# Patient Record
Sex: Female | Born: 1975 | Race: White | Hispanic: No | Marital: Single | Smoking: Current every day smoker
Health system: Southern US, Community
[De-identification: ages and names within clinical notes are randomized; demographics above are authoritative.]

## PROBLEM LIST (undated history)

## (undated) DIAGNOSIS — R112 Nausea with vomiting, unspecified: Secondary | ICD-10-CM

## (undated) DIAGNOSIS — F32A Depression, unspecified: Secondary | ICD-10-CM

## (undated) DIAGNOSIS — J45909 Unspecified asthma, uncomplicated: Secondary | ICD-10-CM

## (undated) DIAGNOSIS — M1990 Unspecified osteoarthritis, unspecified site: Secondary | ICD-10-CM

## (undated) DIAGNOSIS — F1910 Other psychoactive substance abuse, uncomplicated: Secondary | ICD-10-CM

## (undated) DIAGNOSIS — I10 Essential (primary) hypertension: Secondary | ICD-10-CM

## (undated) DIAGNOSIS — J449 Chronic obstructive pulmonary disease, unspecified: Secondary | ICD-10-CM

## (undated) DIAGNOSIS — T8859XA Other complications of anesthesia, initial encounter: Secondary | ICD-10-CM

## (undated) DIAGNOSIS — E079 Disorder of thyroid, unspecified: Secondary | ICD-10-CM

## (undated) DIAGNOSIS — Z9889 Other specified postprocedural states: Secondary | ICD-10-CM

## (undated) DIAGNOSIS — E039 Hypothyroidism, unspecified: Secondary | ICD-10-CM

## (undated) DIAGNOSIS — F419 Anxiety disorder, unspecified: Secondary | ICD-10-CM

## (undated) DIAGNOSIS — E119 Type 2 diabetes mellitus without complications: Secondary | ICD-10-CM

## (undated) DIAGNOSIS — E785 Hyperlipidemia, unspecified: Secondary | ICD-10-CM

## (undated) HISTORY — PX: BACK SURGERY: SHX140

## (undated) HISTORY — DX: Hyperlipidemia, unspecified: E78.5

## (undated) HISTORY — PX: FOOT SURGERY: SHX648

## (undated) HISTORY — DX: Essential (primary) hypertension: I10

## (undated) HISTORY — DX: Unspecified osteoarthritis, unspecified site: M19.90

## (undated) HISTORY — DX: Other psychoactive substance abuse, uncomplicated: F19.10

## (undated) HISTORY — DX: Disorder of thyroid, unspecified: E07.9

---

## 2004-03-12 ENCOUNTER — Emergency Department (HOSPITAL_COMMUNITY): Admission: EM | Admit: 2004-03-12 | Discharge: 2004-03-12 | Payer: Self-pay | Admitting: Emergency Medicine

## 2004-09-29 ENCOUNTER — Ambulatory Visit: Payer: Self-pay | Admitting: Orthopedic Surgery

## 2004-10-17 ENCOUNTER — Ambulatory Visit: Payer: Self-pay | Admitting: Orthopedic Surgery

## 2004-11-15 ENCOUNTER — Encounter: Admission: RE | Admit: 2004-11-15 | Discharge: 2004-11-15 | Payer: Self-pay | Admitting: Orthopedic Surgery

## 2004-11-21 ENCOUNTER — Ambulatory Visit: Payer: Self-pay | Admitting: Orthopedic Surgery

## 2004-12-19 ENCOUNTER — Ambulatory Visit: Payer: Self-pay | Admitting: Orthopedic Surgery

## 2004-12-27 ENCOUNTER — Encounter: Admission: RE | Admit: 2004-12-27 | Discharge: 2004-12-27 | Payer: Self-pay | Admitting: Orthopedic Surgery

## 2005-01-02 ENCOUNTER — Ambulatory Visit: Payer: Self-pay | Admitting: Orthopedic Surgery

## 2005-01-11 ENCOUNTER — Ambulatory Visit: Payer: Self-pay | Admitting: Orthopedic Surgery

## 2005-02-01 ENCOUNTER — Ambulatory Visit: Payer: Self-pay | Admitting: Orthopedic Surgery

## 2005-02-28 ENCOUNTER — Encounter: Admission: RE | Admit: 2005-02-28 | Discharge: 2005-02-28 | Payer: Self-pay | Admitting: Sports Medicine

## 2007-11-21 HISTORY — PX: CHOLECYSTECTOMY: SHX55

## 2009-06-07 ENCOUNTER — Emergency Department (HOSPITAL_COMMUNITY): Admission: EM | Admit: 2009-06-07 | Discharge: 2009-06-07 | Payer: Self-pay | Admitting: Emergency Medicine

## 2010-12-10 ENCOUNTER — Encounter: Payer: Self-pay | Admitting: Sports Medicine

## 2010-12-11 ENCOUNTER — Encounter: Payer: Self-pay | Admitting: Orthopedic Surgery

## 2010-12-11 ENCOUNTER — Encounter: Payer: Self-pay | Admitting: Sports Medicine

## 2011-07-22 HISTORY — PX: KNEE SURGERY: SHX244

## 2012-05-13 ENCOUNTER — Other Ambulatory Visit: Payer: Self-pay | Admitting: Rehabilitation

## 2012-05-13 ENCOUNTER — Ambulatory Visit
Admission: RE | Admit: 2012-05-13 | Discharge: 2012-05-13 | Disposition: A | Discharge status: Home / Self Care | Payer: Private Health Insurance - Indemnity | Source: Ambulatory Visit | Attending: Rehabilitation | Admitting: Rehabilitation

## 2012-05-13 DIAGNOSIS — M545 Low back pain: Secondary | ICD-10-CM

## 2012-05-13 DIAGNOSIS — IMO0002 Reserved for concepts with insufficient information to code with codable children: Secondary | ICD-10-CM

## 2012-05-15 HISTORY — PX: SPINE SURGERY: SHX786

## 2012-09-17 ENCOUNTER — Encounter (INDEPENDENT_AMBULATORY_CARE_PROVIDER_SITE_OTHER): Payer: Self-pay | Admitting: General Surgery

## 2012-09-17 ENCOUNTER — Ambulatory Visit (INDEPENDENT_AMBULATORY_CARE_PROVIDER_SITE_OTHER): Payer: Private Health Insurance - Indemnity | Admitting: General Surgery

## 2012-09-17 VITALS — BP 116/86 | HR 120 | Temp 97.8°F | Ht 67.0 in | Wt 224.8 lb

## 2012-09-17 DIAGNOSIS — K625 Hemorrhage of anus and rectum: Secondary | ICD-10-CM

## 2012-09-17 NOTE — Progress Notes (Signed)
Chief Complaint  Patient presents with  . Pre-op Exam    eval hems  . Rectal Problems    HISTORY: Natasha Cain is a 36 y.o. female who presents to the office with rectal pain.  Other symptoms include bleeding with bowel movement.  She has tried steroid suppositories in the past with decent success.  Diarrhea makes the symptoms worse.  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  She has had one ext thrombectomy in the past.  It is intermittent in nature and occurs about a month.  Her bowel habits are regular and her bowel movements are mainly soft.  She has had constipation in the past associated with narcotics.  Her fiber intake is mild to moderate.  She has never had a colonoscopy.  She does not think she has any prolapsing tissue.      Past Medical History  Diagnosis Date  . Arthritis   . Hyperlipidemia   . Hypertension   . Substance abuse   . Thyroid disease       Past Surgical History  Procedure Date  . Spine surgery 05/15/12  . Cesarean section 05/22/07  . Cholecystectomy 2009  . Knee surgery 07/2011    left        Current Outpatient Prescriptions  Medication Sig Dispense Refill  . ciprofloxacin (CIPRO) 500 MG tablet Take 500 mg by mouth 2 (two) times daily.      Marland Kitchen ibuprofen (ADVIL,MOTRIN) 800 MG tablet Take 800 mg by mouth 2 (two) times daily.      Marland Kitchen levothyroxine (SYNTHROID, LEVOTHROID) 50 MCG tablet Take 50 mcg by mouth daily.      . Multiple Vitamins-Minerals (MULTIVITAMIN WITH MINERALS) tablet Take 1 tablet by mouth daily.      Marland Kitchen omeprazole (PRILOSEC) 20 MG capsule Take 20 mg by mouth daily.      . phentermine 37.5 MG capsule Take 37.5 mg by mouth every morning.          Allergies  Allergen Reactions  . Latex Rash  . Sulfa Antibiotics Rash      Family History  Problem Relation Age of Onset  . Cancer Mother     breast  . Cancer Maternal Aunt     skin cancer on face  . Cancer Paternal  Uncle     colon  . Cancer Maternal Grandfather     lung  . Cancer Paternal Grandfather     colon  . Cancer Cousin     colon    History   Social History  . Marital Status: Married    Spouse Name: N/A    Number of Children: N/A  . Years of Education: N/A   Social History Main Topics  . Smoking status: Current Every Day Smoker -- 1.0 packs/day    Types: Cigarettes  . Smokeless tobacco: None  . Alcohol Use: No  . Drug Use: No  . Sexually Active:    Other Topics Concern  . None   Social History Narrative  . None      REVIEW OF SYSTEMS - PERTINENT POSITIVES ONLY: Review of Systems - General ROS: negative for - chills, fever or weight gain Hematological and Lymphatic ROS: negative for - bleeding problems, blood clots or bruising Respiratory ROS: no cough, shortness of breath, or wheezing Cardiovascular ROS: no chest pain or dyspnea on exertion Gastrointestinal ROS: positive for - abdominal pain, constipation and occasional diarrhea  negative for - appetite loss or melena Genito-Urinary ROS: no dysuria, trouble voiding, or hematuria  EXAM: Filed Vitals:   09/17/12 1108  BP: 116/86  Pulse: 120  Temp: 97.8 F (36.6 C)    General appearance: alert, cooperative and no distress Resp: clear to auscultation bilaterally Cardio: regular rate and rhythm GI: soft, non-tender; bowel sounds normal; no masses,  no organomegaly   Procedure: Anoscopy Surgeon: Maisie Fus Diagnosis: rectal bleeding  Assistant: Christella Scheuermann After the risks and benefits were explained, verbal consent was obtained for above procedure  Anesthesia: none Findings: small post skin tag, moderate left anterior external skin tag with slight mucosal prolpase    ASSESSMENT AND PLAN: Natasha Cain is a 36 y.o. female who presents to my office with rectal bleeding.  This appears to be related to her bowel habits and occasional constipation.  I see evidence that she has had an anal fissure in the past.  I  have recommended that she undergo a colonoscopy, given the rectal bleeding and her strong family history of colon cancer.  For her anorectal pain, I have recommended a high fiber diet and plenty of physical activity and water.  She may add a stool softener to her regimen if she still has trouble with constipation after implementing the above recommendations.  We will schedule her colonoscopy and have her return to see me in 8 wks.    Vanita Panda, MD Colon and Rectal Surgery / General Surgery [facility]      Visit Diagnoses: 1. Rectal bleeding     Primary Care Physician: Samuel Jester, DO

## 2012-09-17 NOTE — Patient Instructions (Addendum)
Fiber Chart  You should 25-30g of fiber per day and drinking 8 glasses of water to help your bowels move regularly.  In the chart below you can look up how much fiber you are getting in an average day.  If you are not getting enough fiber, you should add a fiber supplement to your diet.  Examples of this include Metamucil, FiberCon and Citrucel.  These can be purchased at your local grocery store or pharmacy.      http://www.[employer].edu/offices/health/nutritioncoach/AtoZ/handouts/Fiber.pdf   GETTING TO GOOD BOWEL HEALTH. Irregular bowel habits such as constipation can lead to many problems over time.  Having one soft bowel movement a day is the most important way to prevent further problems.  The anorectal canal is designed to handle stretching and feces to safely manage our ability to get rid of solid waste (feces, poop, stool) out of our body.  BUT, hard constipated stools can act like ripping concrete bricks causing inflamed hemorrhoids, anal fissures, abdominal pain and bloating.     The goal: ONE SOFT BOWEL MOVEMENT A DAY!  To have soft, regular bowel movements:    Drink at least 8 tall glasses of water a day.     Take plenty of fiber.  Fiber is the undigested part of plant food that passes into the colon, acting s "natures broom" to encourage bowel motility and movement.  Fiber can absorb and hold large amounts of water. This results in a larger, bulkier stool, which is soft and easier to pass. Work gradually over several weeks up to 6 servings a day of fiber (25g a day even more if needed) in the form of: o Vegetables -- Root (potatoes, carrots, turnips), leafy green (lettuce, salad greens, celery, spinach), or cooked high residue (cabbage, broccoli, etc) o Fruit -- Fresh (unpeeled skin & pulp), Dried (prunes, apricots, cherries, etc ),  or stewed ( applesauce)  o Whole grain breads, pasta, etc (whole wheat)  o Bran cereals    Bulking Agents -- This type of water-retaining fiber generally is  easily obtained each day by one of the following:  o Psyllium bran -- The psyllium plant is remarkable because its ground seeds can retain so much water. This product is available as Metamucil, Konsyl, Effersyllium, Per Diem Fiber, or the less expensive generic preparation in drug and health food stores. Although labeled a laxative, it really is not a laxative.  o Methylcellulose -- This is another fiber derived from wood which also retains water. It is available as Citrucel. o Polyethylene Glycol - and "artificial" fiber commonly called Miralax or Glycolax.  It is helpful for people with gassy or bloated feelings with regular fiber o Flax Seed - a less gassy fiber than psyllium   No reading or other relaxing activity while on the toilet. If bowel movements take longer than 5 minutes, you are too constipated   AVOID CONSTIPATION.  High fiber and water intake usually takes care of this.  Sometimes a laxative is needed to stimulate more frequent bowel movements, but    Laxatives are not a good long-term solution as it can wear the colon out. o Osmotics (Milk of Magnesia, Fleets phosphosoda, Magnesium citrate, MiraLax, GoLytely) are safer than  o Stimulants (Senokot, Castor Oil, Dulcolax, Ex Lax)    o Do not take laxatives for more than 7days in a row.    IF SEVERELY CONSTIPATED, try a Bowel Retraining Program: o Do not use laxatives.  o Eat a diet high in roughage, such as   bran cereals and leafy vegetables.  o Drink six (6) ounces of prune or apricot juice each morning.  o Eat two (2) large servings of stewed fruit each day.  o Take one (1) heaping tablespoon of a psyllium-based bulking agent twice a day. Use sugar-free sweetener when possible to avoid excessive calories.  o Eat a normal breakfast.  o Set aside 15 minutes after breakfast to sit on the toilet, but do not strain to have a bowel movement.  o If you do not have a bowel movement by the third day, use an enema and repeat the above steps.         [facility]  ONE-DAY (1) PRE-OP HOME COLON PREP INSTRUCTIONS: ** MIRALAX / GATORADE PREP **  You must follow the instructions below carefully.  If you have questions or problems, please call and speak to someone in the clinic department at our office:   [phone].     INSTRUCTIONS: 1. Five days prior to your procedure do not eat nuts, popcorn, or fruit with seeds.  Stop all fiber supplements such as Metamucil, Citrucel, etc. 2. Two days before surgery fill the prescription at a pharmacy of your choice and purchase the additional supplies below.         MIRALAX - GATORADE -- DULCOLAX TABS:   Purchase a bottle of MIRALAX  (255 gm bottle)    In addition, purchase four (4) DULCOLAX TABLETS (no prescription required- ask the pharmacist if you can't find them)    Purchase one 64 oz GATORADE.  (Do NOT purchase red Gatorade; any other flavor is acceptable) and place in refrigerator to get cold.  3.   Day Before Surgery:   6 am: take the 4 Dulcolax tablets   You may only have clear liquids (tea, coffee, juice, broth, jello, soft drinks, gummy bears).  You cannot have solid foods, cream, milk or milk products.  Drink at lease 8 ounces of liquids every hour while awake.   Mix the entire bottle of MiraLax and the Gatorade in a large container.    10:00am: Begin drinking the Gatorade mixture until gone (8 oz every 15-30 minutes).      You may suck on a lime wedge or hard candy to "freshen your palate" in between glasses   If you are a diabetic, take your blood sugar reading several time throughout the prep.  Have some juice available to take if your sugar level gets too low   You may feel chilled while taking the prep.  Have some warm tea or broth to help warm up.   Continue clear liquids until midnight or bedtime  3. The day of your procedure:   Do not eat or drink ANYTHING after midnight before your surgery.     If you take Heart or Blood Pressure medicine, ask the pre-op  nurses about these during your preop appointment.   Further pre-operative instructions will be given to you from the hospital.   Expect to be contacted 5-7 days before your surgery. 

## 2012-10-11 ENCOUNTER — Ambulatory Visit (HOSPITAL_COMMUNITY)
Admission: RE | Admit: 2012-10-11 | Payer: Managed Care, Other (non HMO) | Source: Ambulatory Visit | Admitting: General Surgery

## 2012-10-11 ENCOUNTER — Encounter (HOSPITAL_COMMUNITY): Admission: RE | Payer: Self-pay | Source: Ambulatory Visit

## 2012-10-11 SURGERY — COLONOSCOPY
Anesthesia: Moderate Sedation

## 2012-10-28 ENCOUNTER — Encounter (HOSPITAL_COMMUNITY): Payer: Self-pay | Admitting: Pharmacy Technician

## 2012-11-01 ENCOUNTER — Ambulatory Visit (HOSPITAL_COMMUNITY)
Admission: RE | Admit: 2012-11-01 | Payer: Managed Care, Other (non HMO) | Source: Ambulatory Visit | Admitting: General Surgery

## 2012-11-01 ENCOUNTER — Encounter (HOSPITAL_COMMUNITY): Admission: RE | Payer: Self-pay | Source: Ambulatory Visit

## 2012-11-01 SURGERY — COLONOSCOPY
Anesthesia: Moderate Sedation

## 2012-11-11 ENCOUNTER — Ambulatory Visit (INDEPENDENT_AMBULATORY_CARE_PROVIDER_SITE_OTHER): Payer: Private Health Insurance - Indemnity | Admitting: General Surgery

## 2013-03-04 ENCOUNTER — Emergency Department (HOSPITAL_COMMUNITY)
Admission: EM | Admit: 2013-03-04 | Discharge: 2013-03-04 | Disposition: A | Discharge status: Home / Self Care | Payer: Worker's Compensation | Attending: Emergency Medicine | Admitting: Emergency Medicine

## 2013-03-04 ENCOUNTER — Encounter (HOSPITAL_COMMUNITY): Payer: Self-pay | Admitting: *Deleted

## 2013-03-04 ENCOUNTER — Emergency Department (HOSPITAL_COMMUNITY): Payer: Worker's Compensation

## 2013-03-04 DIAGNOSIS — Z9889 Other specified postprocedural states: Secondary | ICD-10-CM | POA: Insufficient documentation

## 2013-03-04 DIAGNOSIS — I10 Essential (primary) hypertension: Secondary | ICD-10-CM | POA: Insufficient documentation

## 2013-03-04 DIAGNOSIS — R296 Repeated falls: Secondary | ICD-10-CM | POA: Insufficient documentation

## 2013-03-04 DIAGNOSIS — E079 Disorder of thyroid, unspecified: Secondary | ICD-10-CM | POA: Insufficient documentation

## 2013-03-04 DIAGNOSIS — M129 Arthropathy, unspecified: Secondary | ICD-10-CM | POA: Insufficient documentation

## 2013-03-04 DIAGNOSIS — M5431 Sciatica, right side: Secondary | ICD-10-CM

## 2013-03-04 DIAGNOSIS — Y939 Activity, unspecified: Secondary | ICD-10-CM | POA: Insufficient documentation

## 2013-03-04 DIAGNOSIS — E785 Hyperlipidemia, unspecified: Secondary | ICD-10-CM | POA: Insufficient documentation

## 2013-03-04 DIAGNOSIS — Y9229 Other specified public building as the place of occurrence of the external cause: Secondary | ICD-10-CM | POA: Insufficient documentation

## 2013-03-04 DIAGNOSIS — Z79899 Other long term (current) drug therapy: Secondary | ICD-10-CM | POA: Insufficient documentation

## 2013-03-04 DIAGNOSIS — R32 Unspecified urinary incontinence: Secondary | ICD-10-CM | POA: Insufficient documentation

## 2013-03-04 DIAGNOSIS — IMO0002 Reserved for concepts with insufficient information to code with codable children: Secondary | ICD-10-CM | POA: Insufficient documentation

## 2013-03-04 DIAGNOSIS — F17200 Nicotine dependence, unspecified, uncomplicated: Secondary | ICD-10-CM | POA: Insufficient documentation

## 2013-03-04 MED ORDER — OXYCODONE-ACETAMINOPHEN 5-325 MG PO TABS: 1.0000 | ORAL_TABLET | Freq: Four times a day (QID) | ORAL | Status: DC | PRN

## 2013-03-04 MED ORDER — HYDROMORPHONE HCL PF 1 MG/ML IJ SOLN
1.0000 mg | Freq: Once | INTRAMUSCULAR | Status: DC
  Filled 2013-03-04: qty 1

## 2013-03-04 MED ORDER — METHYLPREDNISOLONE SODIUM SUCC 125 MG IJ SOLR
125.0000 mg | Freq: Once | INTRAMUSCULAR | Status: AC
  Administered 2013-03-04: 125 mg via INTRAVENOUS
  Filled 2013-03-04: qty 2

## 2013-03-04 MED ORDER — PREDNISONE 10 MG PO TABS: 20.0000 mg | ORAL_TABLET | Freq: Every day | ORAL | Status: DC

## 2013-03-04 MED ORDER — KETOROLAC TROMETHAMINE 30 MG/ML IJ SOLN
30.0000 mg | Freq: Once | INTRAMUSCULAR | Status: AC
  Administered 2013-03-04: 30 mg via INTRAVENOUS
  Filled 2013-03-04: qty 1

## 2013-03-04 MED ORDER — ONDANSETRON HCL 4 MG/2ML IJ SOLN
4.0000 mg | Freq: Once | INTRAMUSCULAR | Status: DC
  Filled 2013-03-04: qty 2
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
## 2013-07-04 ENCOUNTER — Emergency Department (HOSPITAL_COMMUNITY)
Admission: EM | Admit: 2013-07-04 | Discharge: 2013-07-04 | Disposition: A | Discharge status: Home / Self Care | Payer: Worker's Compensation | Attending: Emergency Medicine | Admitting: Emergency Medicine

## 2013-07-04 ENCOUNTER — Encounter (HOSPITAL_COMMUNITY): Payer: Self-pay

## 2013-07-04 DIAGNOSIS — F17200 Nicotine dependence, unspecified, uncomplicated: Secondary | ICD-10-CM | POA: Diagnosis not present

## 2013-07-04 DIAGNOSIS — IMO0002 Reserved for concepts with insufficient information to code with codable children: Secondary | ICD-10-CM

## 2013-07-04 DIAGNOSIS — Z79899 Other long term (current) drug therapy: Secondary | ICD-10-CM | POA: Insufficient documentation

## 2013-07-04 DIAGNOSIS — F1910 Other psychoactive substance abuse, uncomplicated: Secondary | ICD-10-CM | POA: Insufficient documentation

## 2013-07-04 DIAGNOSIS — E079 Disorder of thyroid, unspecified: Secondary | ICD-10-CM | POA: Insufficient documentation

## 2013-07-04 DIAGNOSIS — Z862 Personal history of diseases of the blood and blood-forming organs and certain disorders involving the immune mechanism: Secondary | ICD-10-CM | POA: Insufficient documentation

## 2013-07-04 DIAGNOSIS — M5126 Other intervertebral disc displacement, lumbar region: Secondary | ICD-10-CM | POA: Insufficient documentation

## 2013-07-04 DIAGNOSIS — Z8739 Personal history of other diseases of the musculoskeletal system and connective tissue: Secondary | ICD-10-CM | POA: Insufficient documentation

## 2013-07-04 DIAGNOSIS — Z8639 Personal history of other endocrine, nutritional and metabolic disease: Secondary | ICD-10-CM | POA: Insufficient documentation

## 2013-07-04 DIAGNOSIS — Z91040 Latex allergy status: Secondary | ICD-10-CM | POA: Insufficient documentation

## 2013-07-04 DIAGNOSIS — I10 Essential (primary) hypertension: Secondary | ICD-10-CM | POA: Insufficient documentation

## 2013-07-04 DIAGNOSIS — M545 Low back pain: Secondary | ICD-10-CM | POA: Diagnosis present

## 2013-07-04 MED ORDER — HYDROMORPHONE HCL PF 1 MG/ML IJ SOLN
1.0000 mg | Freq: Once | INTRAMUSCULAR | Status: AC
  Administered 2013-07-04: 1 mg via INTRAMUSCULAR
  Filled 2013-07-04: qty 1

## 2013-07-04 MED ORDER — OXYCODONE-ACETAMINOPHEN 5-325 MG PO TABS: 2.0000 | ORAL_TABLET | ORAL | Status: DC | PRN

## 2013-07-04 MED ORDER — ZOLPIDEM TARTRATE 5 MG PO TABS: 5.0000 mg | ORAL_TABLET | Freq: Every evening | ORAL | Status: DC | PRN

## 2013-07-04 MED ORDER — PREDNISONE 20 MG PO TABS: ORAL_TABLET | ORAL | Status: DC

## 2013-07-04 NOTE — ED Notes (Signed)
Pt c/o back pain since April after an injury. Pt states she had surgery at L5 last year and had no complications until her injury in April. Pt has appointment in September with surgeon.

## 2013-07-04 NOTE — ED Provider Notes (Signed)
CSN: [id number]     Arrival date & time 07/04/13  1513 History  This chart was scribed for Donnetta Hutching, MD by Shari Heritage, ED Scribe. The patient was seen in room APA09/APA09. Patient's care was started at 1538.   First MD Initiated Contact with Patient 07/04/13 1538     Chief Complaint  Patient presents with  . Back Pain    The history is provided by the patient. No language interpreter was used.    HPI Comments: Natasha Cain is a 37 y.o. female with history of herniated discs (L3, L4) and lumbar discectomy (L5) who presents to the Emergency Department complaining of severe, dull, constant lower back pain onset 4 months ago. She states that over the last week, pain has progressively worsened. Pain is worse with movement and often wakes her from sleep.  She also reports associated numbness in her right leg and intermittent bladder incontinence. She says that she was injured in April 2014 at work [employer]). She has an appointment scheduled with Dr. Noel Gerold, an orthopedic surgeon in [place], on September 19. She has seen a PA in this office who advised her to take Motrin and prescribed Gabapentin and Ultram, but states that this medicine is not providing relief. She says that in the past, Percocet has improved pain. Patient's other medical history includes hyperlipidemia, hypertension, and thyroid disease.   Past Medical History  Diagnosis Date  . Arthritis   . Hyperlipidemia   . Hypertension   . Substance abuse   . Thyroid disease    Past Surgical History  Procedure Laterality Date  . Spine surgery  05/15/12  . Cesarean section  05/22/07  . Cholecystectomy  2009  . Knee surgery  07/2011    left   Family History  Problem Relation Age of Onset  . Cancer Mother     breast  . Cancer Maternal Aunt     skin cancer on face  . Cancer Paternal Uncle     colon  . Cancer Maternal Grandfather     lung  . Cancer Paternal Grandfather     colon  . Cancer Cousin     colon    History  Substance Use Topics  . Smoking status: Current Every Day Smoker -- 1.00 packs/day    Types: Cigarettes  . Smokeless tobacco: Not on file  . Alcohol Use: No   OB History   Grav Para Term Preterm Abortions TAB SAB Ect Mult Living                 Review of Systems A complete 10 system review of systems was obtained and all systems are negative except as noted in the HPI and PMH.   Allergies  Latex and Sulfa antibiotics  Home Medications   Current Outpatient Rx  Name  Route  Sig  Dispense  Refill  . ibuprofen (ADVIL,MOTRIN) 800 MG tablet   Oral   Take 800 mg by mouth 3 (three) times daily as needed. For pain         . levothyroxine (SYNTHROID, LEVOTHROID) 50 MCG tablet   Oral   Take 50 mcg by mouth every morning.          . Multiple Vitamins-Minerals (MULTIVITAMIN WITH MINERALS) tablet   Oral   Take 1 tablet by mouth daily.         . norethindrone (NORA-BE) 0.35 MG tablet   Oral   Take 1 tablet by mouth every morning.         Marland Kitchen  omeprazole (PRILOSEC) 40 MG capsule   Oral   Take 40 mg by mouth daily.         Marland Kitchen oxyCODONE-acetaminophen (PERCOCET/ROXICET) 5-325 MG per tablet   Oral   Take 1 tablet by mouth every 6 (six) hours as needed for pain.   40 tablet   0   . phentermine 37.5 MG capsule   Oral   Take 37.5 mg by mouth every morning.         . predniSONE (DELTASONE) 10 MG tablet   Oral   Take 2 tablets (20 mg total) by mouth daily.   14 tablet   0   . triamterene-hydrochlorothiazide (MAXZIDE-25) 37.5-25 MG per tablet   Oral   Take 1 tablet by mouth daily.         . Vitamin D, Ergocalciferol, (DRISDOL) 50000 UNITS CAPS   Oral   Take 50,000 Units by mouth 2 (two) times a week.         Marland Kitchen VITAMIN E PO   Oral   Take 1 tablet by mouth daily.          Triage Vitals: BP 137/88  Pulse 118  Temp(Src) 98.6 F (37 C) (Oral)  SpO2 100%  Physical Exam  Constitutional: She is oriented to person, place, and time. She appears  well-developed and well-nourished.  HENT:  Head: Normocephalic and atraumatic.  Eyes: Conjunctivae and EOM are normal.  Neck: Normal range of motion. Neck supple.  Cardiovascular: Normal rate.   Pulmonary/Chest: Effort normal. No respiratory distress.  Musculoskeletal: She exhibits no edema.  Tender in lumbar region. Has difficulty ambulating.  Neurological: She is alert and oriented to person, place, and time.  Skin: Skin is warm and dry. No rash noted.  Psychiatric: She has a normal mood and affect.    ED Course   Procedures (including critical care time) DIAGNOSTIC STUDIES: Oxygen Saturation is 100% on room air, normal by my interpretation.    COORDINATION OF CARE: 4:34 PM- Will attempt to arrange a sooner appointment with orthopedic surgery. Will prescribe Percocet, prednisone and Ambien. Will order a shot of Percocet in the ED. Patient informed of current plan for treatment and evaluation and agrees with plan at this time.     Labs Reviewed - No data to display No results found. No diagnosis found.  MDM  Patient complains of persistent low back pain.  No bowel or bladder incontinence.  Rx Percocet, prednisone, Ambien.   Referral to orthopedic Dr.     I personally performed the services described in this documentation, which was scribed in my presence. The recorded information has been reviewed and is accurate.    Donnetta Hutching, MD 07/06/13 [phone]

## 2013-07-04 NOTE — ED Notes (Signed)
Pt reports an injury to her back in April of this year.  Pt reports severe pain to her lower back that continues to worsen.

## 2013-07-28 ENCOUNTER — Encounter (HOSPITAL_COMMUNITY): Payer: Self-pay | Admitting: *Deleted

## 2013-07-28 ENCOUNTER — Emergency Department (HOSPITAL_COMMUNITY)
Admission: EM | Admit: 2013-07-28 | Discharge: 2013-07-28 | Disposition: A | Discharge status: Home / Self Care | Payer: Worker's Compensation | Attending: Emergency Medicine | Admitting: Emergency Medicine

## 2013-07-28 DIAGNOSIS — Z791 Long term (current) use of non-steroidal anti-inflammatories (NSAID): Secondary | ICD-10-CM | POA: Insufficient documentation

## 2013-07-28 DIAGNOSIS — Z91040 Latex allergy status: Secondary | ICD-10-CM | POA: Insufficient documentation

## 2013-07-28 DIAGNOSIS — M545 Low back pain, unspecified: Secondary | ICD-10-CM | POA: Insufficient documentation

## 2013-07-28 DIAGNOSIS — I10 Essential (primary) hypertension: Secondary | ICD-10-CM | POA: Insufficient documentation

## 2013-07-28 DIAGNOSIS — G8929 Other chronic pain: Secondary | ICD-10-CM | POA: Insufficient documentation

## 2013-07-28 DIAGNOSIS — Z79899 Other long term (current) drug therapy: Secondary | ICD-10-CM | POA: Insufficient documentation

## 2013-07-28 DIAGNOSIS — E079 Disorder of thyroid, unspecified: Secondary | ICD-10-CM | POA: Insufficient documentation

## 2013-07-28 DIAGNOSIS — M79609 Pain in unspecified limb: Secondary | ICD-10-CM | POA: Insufficient documentation

## 2013-07-28 DIAGNOSIS — M129 Arthropathy, unspecified: Secondary | ICD-10-CM | POA: Insufficient documentation

## 2013-07-28 DIAGNOSIS — F17200 Nicotine dependence, unspecified, uncomplicated: Secondary | ICD-10-CM | POA: Insufficient documentation

## 2013-07-28 DIAGNOSIS — Z87828 Personal history of other (healed) physical injury and trauma: Secondary | ICD-10-CM | POA: Insufficient documentation

## 2013-07-28 MED ORDER — OXYCODONE-ACETAMINOPHEN 5-325 MG PO TABS: 1.0000 | ORAL_TABLET | ORAL | Status: DC | PRN

## 2013-07-28 MED ORDER — OXYCODONE-ACETAMINOPHEN 5-325 MG PO TABS
2.0000 | ORAL_TABLET | Freq: Once | ORAL | Status: AC
  Administered 2013-07-28: 2 via ORAL
  Filled 2013-07-28: qty 2

## 2013-07-28 NOTE — ED Notes (Signed)
Chronic lower back pain from injury in April of this year, worsening.  Here for pain relief.

## 2013-07-28 NOTE — ED Notes (Signed)
Back pain since injury in April. Pts rt eye is red, PA suggests using natural tears, and warm  compresses

## 2013-07-28 NOTE — ED Provider Notes (Signed)
CSN: [id number]     Arrival date & time 07/28/13  1726 History   First MD Initiated Contact with Patient 07/28/13 1743     Chief Complaint  Patient presents with  . Back Pain   (Consider location/radiation/quality/duration/timing/severity/associated sxs/prior Treatment) HPI Comments: Patient with hx of worsening of her chronic low back pain.  States she suffered an injury at her place of employment and has filed a [employer] and has been seeing an orthopedic physician who has been prescribing Tramadol, but it has not been controlling her pain recently.  She denies new injury, incontinence of bladder or bowel, numbness or weakness of her lower extremites, dysuria, or abd pain  Patient is a 37 y.o. female presenting with back pain. The history is provided by the patient.  Back Pain Location:  Lumbar spine Quality:  Aching and burning Radiates to:  R posterior upper leg and R thigh Pain severity:  Severe Pain is:  Same all the time Onset quality:  Gradual Duration:  5 months Timing:  Constant Progression:  Worsening Chronicity:  Chronic Context: falling   Context comment:  Larey Seat at work in April.  Worker's Comp case.   Relieved by:  Nothing Worsened by:  Bending, ambulation, sitting and twisting Ineffective treatments:  Ibuprofen, OTC medications and bed rest Associated symptoms: leg pain   Associated symptoms: no abdominal pain, no abdominal swelling, no bladder incontinence, no bowel incontinence, no chest pain, no dysuria, no fever, no headaches, no numbness, no paresthesias, no pelvic pain, no perianal numbness, no tingling and no weakness     Past Medical History  Diagnosis Date  . Arthritis   . Hyperlipidemia   . Hypertension   . Substance abuse   . Thyroid disease    Past Surgical History  Procedure Laterality Date  . Spine surgery  05/15/12  . Cesarean section  05/22/07  . Cholecystectomy  2009  . Knee surgery  07/2011    left   Family History  Problem Relation  Age of Onset  . Cancer Mother     breast  . Cancer Maternal Aunt     skin cancer on face  . Cancer Paternal Uncle     colon  . Cancer Maternal Grandfather     lung  . Cancer Paternal Grandfather     colon  . Cancer Cousin     colon   History  Substance Use Topics  . Smoking status: Current Every Day Smoker -- 1.00 packs/day    Types: Cigarettes  . Smokeless tobacco: Not on file  . Alcohol Use: No   OB History   Grav Para Term Preterm Abortions TAB SAB Ect Mult Living                 Review of Systems  Constitutional: Negative for fever.  HENT: Negative for neck pain.   Respiratory: Negative for shortness of breath.   Cardiovascular: Negative for chest pain.  Gastrointestinal: Negative for vomiting, abdominal pain, constipation and bowel incontinence.  Genitourinary: Negative for bladder incontinence, dysuria, hematuria, flank pain, decreased urine volume, difficulty urinating and pelvic pain.       No perineal numbness or incontinence of urine or feces  Musculoskeletal: Positive for back pain. Negative for joint swelling and arthralgias.  Skin: Negative for rash.  Neurological: Negative for tingling, weakness, numbness, headaches and paresthesias.  All other systems reviewed and are negative.    Allergies  Latex and Sulfa antibiotics  Home Medications   Current Outpatient Rx  Name  Route  Sig  Dispense  Refill  . fish oil-omega-3 fatty acids 1000 MG capsule   Oral   Take 3 g by mouth daily.         Marland Kitchen gabapentin (NEURONTIN) 300 MG capsule   Oral   Take 600-900 mg by mouth 3 (three) times daily. Take two capsules twice daily and take three capsules at bedtime         . ibuprofen (ADVIL,MOTRIN) 800 MG tablet   Oral   Take 800 mg by mouth 2 (two) times daily. For pain         . levothyroxine (SYNTHROID, LEVOTHROID) 50 MCG tablet   Oral   Take 50 mcg by mouth every morning.          . Multiple Vitamins-Minerals (MULTIVITAMIN WITH MINERALS) tablet    Oral   Take 1 tablet by mouth at bedtime.          Marland Kitchen omeprazole (PRILOSEC) 40 MG capsule   Oral   Take 40 mg by mouth every morning.          Marland Kitchen oxyCODONE-acetaminophen (PERCOCET) 5-325 MG per tablet   Oral   Take 2 tablets by mouth every 4 (four) hours as needed for pain.   30 tablet   0   . oxyCODONE-acetaminophen (PERCOCET/ROXICET) 5-325 MG per tablet   Oral   Take 1 tablet by mouth every 4 (four) hours as needed for pain.   15 tablet   0   . phentermine 37.5 MG capsule   Oral   Take 37.5 mg by mouth every morning.         . predniSONE (DELTASONE) 20 MG tablet      3 tabs po day one, then 2 po daily x 4 days   11 tablet   0   . tiZANidine (ZANAFLEX) 4 MG tablet   Oral   Take 4 mg by mouth at bedtime. May take one tablet three times daily         . traMADol (ULTRAM) 50 MG tablet   Oral   Take 100 mg by mouth 3 (three) times daily.         Marland Kitchen triamterene-hydrochlorothiazide (MAXZIDE-25) 37.5-25 MG per tablet   Oral   Take 1 tablet by mouth every morning.          . zolpidem (AMBIEN) 5 MG tablet   Oral   Take 1 tablet (5 mg total) by mouth at bedtime as needed for sleep.   20 tablet   0    BP 145/103  Pulse 99  Temp(Src) 98.3 F (36.8 C) (Oral)  Resp 16  Ht 5\' 7"  (1.702 m)  Wt 224 lb (101.606 kg)  BMI 35.08 kg/m2  SpO2 100%  LMP 06/27/2013 Physical Exam  Nursing note and vitals reviewed. Constitutional: She is oriented to person, place, and time. She appears well-developed and well-nourished. No distress.  HENT:  Head: Normocephalic and atraumatic.  Neck: Normal range of motion. Neck supple.  Cardiovascular: Normal rate, regular rhythm, normal heart sounds and intact distal pulses.   No murmur heard. Pulmonary/Chest: Effort normal and breath sounds normal. No respiratory distress.  Abdominal: Soft. She exhibits no distension. There is no tenderness.  Musculoskeletal: She exhibits tenderness. She exhibits no edema.       Lumbar back: She  exhibits tenderness and pain. She exhibits normal range of motion, no swelling, no deformity, no laceration and normal pulse.  ttp of the lumbar spine and right  paraspinal muscles.   DP pulses are brisk and symmetrical.  Distal sensation intact.  Hip Flexors/Extensors are intact  Neurological: She is alert and oriented to person, place, and time. She has normal strength. No sensory deficit. She exhibits normal muscle tone. Coordination and gait normal.  Reflex Scores:      Patellar reflexes are 2+ on the right side and 2+ on the left side.      Achilles reflexes are 2+ on the right side and 2+ on the left side. Skin: Skin is warm and dry. No rash noted.    ED Course  Procedures (including critical care time) Labs Review Labs Reviewed - No data to display Imaging Review No results found.  MDM   1. Low back pain    Previous ED charts reviewed by me  Patient reviewed on the [facility] narcotics database. She is currently treated by her orthopedic surgeon with tramadol which she states is not controlling her pain.  Patient has history of chronic low back pain with right-sided lumbar radiculopathy since April of this year.  Patient does report intermittent incontinence of urine, but not bowel. She reports history of same since May of this year and also states that her orthopedic surgeon is aware of this symptom.  Patient is ambulatory. No emergent neurological deficits at this time.  I will prescribe Percocet #15 with the understanding that she will need followup with her primary care physician or orthopedic surgeon for her chronic pain management. Patient verbalizes understanding and agrees to care plan  Giliana Vantil L. Trisha Mangle, PA-C 07/31/13 0118

## 2013-07-31 NOTE — ED Provider Notes (Signed)
Medical screening examination/treatment/procedure(s) were performed by non-physician practitioner and as supervising physician I was immediately available for consultation/collaboration.  Carinna Newhart, MD 07/31/13 1149 

## 2013-09-19 ENCOUNTER — Other Ambulatory Visit (HOSPITAL_COMMUNITY): Payer: Self-pay | Admitting: Orthopaedic Surgery

## 2013-09-19 ENCOUNTER — Ambulatory Visit (HOSPITAL_COMMUNITY)
Admission: RE | Admit: 2013-09-19 | Discharge: 2013-09-19 | Disposition: A | Discharge status: Home / Self Care | Payer: Worker's Compensation | Source: Ambulatory Visit | Attending: Orthopaedic Surgery | Admitting: Orthopaedic Surgery

## 2013-09-19 DIAGNOSIS — M79609 Pain in unspecified limb: Secondary | ICD-10-CM | POA: Insufficient documentation

## 2013-09-19 DIAGNOSIS — I82403 Acute embolism and thrombosis of unspecified deep veins of lower extremity, bilateral: Secondary | ICD-10-CM

## 2013-10-10 ENCOUNTER — Emergency Department (HOSPITAL_COMMUNITY)
Admission: EM | Admit: 2013-10-10 | Discharge: 2013-10-10 | Disposition: A | Discharge status: Home / Self Care | Payer: Worker's Compensation | Attending: Emergency Medicine | Admitting: Emergency Medicine

## 2013-10-10 ENCOUNTER — Encounter (HOSPITAL_COMMUNITY): Payer: Self-pay | Admitting: Emergency Medicine

## 2013-10-10 DIAGNOSIS — F17200 Nicotine dependence, unspecified, uncomplicated: Secondary | ICD-10-CM | POA: Insufficient documentation

## 2013-10-10 DIAGNOSIS — Z91040 Latex allergy status: Secondary | ICD-10-CM | POA: Insufficient documentation

## 2013-10-10 DIAGNOSIS — M542 Cervicalgia: Secondary | ICD-10-CM | POA: Insufficient documentation

## 2013-10-10 DIAGNOSIS — E079 Disorder of thyroid, unspecified: Secondary | ICD-10-CM | POA: Insufficient documentation

## 2013-10-10 DIAGNOSIS — M545 Low back pain, unspecified: Secondary | ICD-10-CM | POA: Insufficient documentation

## 2013-10-10 DIAGNOSIS — I10 Essential (primary) hypertension: Secondary | ICD-10-CM | POA: Insufficient documentation

## 2013-10-10 DIAGNOSIS — M129 Arthropathy, unspecified: Secondary | ICD-10-CM | POA: Insufficient documentation

## 2013-10-10 DIAGNOSIS — R209 Unspecified disturbances of skin sensation: Secondary | ICD-10-CM | POA: Insufficient documentation

## 2013-10-10 DIAGNOSIS — Z3202 Encounter for pregnancy test, result negative: Secondary | ICD-10-CM | POA: Insufficient documentation

## 2013-10-10 DIAGNOSIS — G8921 Chronic pain due to trauma: Secondary | ICD-10-CM | POA: Insufficient documentation

## 2013-10-10 DIAGNOSIS — Z79899 Other long term (current) drug therapy: Secondary | ICD-10-CM | POA: Insufficient documentation

## 2013-10-10 DIAGNOSIS — Z9889 Other specified postprocedural states: Secondary | ICD-10-CM | POA: Insufficient documentation

## 2013-10-10 DIAGNOSIS — M79609 Pain in unspecified limb: Secondary | ICD-10-CM | POA: Insufficient documentation

## 2013-10-10 LAB — URINALYSIS, ROUTINE W REFLEX MICROSCOPIC
Ketones, ur: NEGATIVE mg/dL
Protein, ur: NEGATIVE mg/dL
Urobilinogen, UA: 0.2 mg/dL (ref 0.0–1.0)
pH: 7 (ref 5.0–8.0)

## 2013-10-10 LAB — URINE MICROSCOPIC-ADD ON

## 2013-10-10 MED ORDER — OXYCODONE-ACETAMINOPHEN 5-325 MG PO TABS: 1.0000 | ORAL_TABLET | ORAL | Status: DC | PRN

## 2013-10-10 MED ORDER — HYDROMORPHONE HCL PF 2 MG/ML IJ SOLN
2.0000 mg | Freq: Once | INTRAMUSCULAR | Status: AC
  Administered 2013-10-10: 2 mg via INTRAMUSCULAR
  Filled 2013-10-10: qty 1

## 2013-10-10 NOTE — ED Notes (Signed)
Patient w/prior back injury in April and lower spine surgery in September.  Continuing to have leg pain and also has severe neck pain from same injury.  Having LE edema, but has recently restarted Lasix on Wednesday.

## 2013-10-10 NOTE — ED Notes (Signed)
Pt has no swelling noted to LE.  Pedal pulses present and wnl.  Reports neck pain is radiating down right arm.  Denies new injury.  nad noted.

## 2013-10-11 NOTE — ED Provider Notes (Signed)
CSN: [id number]     Arrival date & time 10/10/13  1108 History   First MD Initiated Contact with Patient 10/10/13 1223     Chief Complaint  Patient presents with  . Back Pain  . Leg Pain   (Consider location/radiation/quality/duration/timing/severity/associated sxs/prior Treatment) Patient is a 37 y.o. female presenting with back pain and neck injury. The history is provided by the patient.  Back Pain Location:  Lumbar spine Quality:  Aching and shooting Radiates to:  R posterior upper leg, R knee and R thigh Pain severity:  Moderate Pain is:  Same all the time Onset quality:  Gradual Duration:  2 months Timing:  Constant Progression:  Unchanged Chronicity:  Chronic Context comment:  Patient has hx of chronic low back pain, had surgery in September but contiues to have pain  Relieved by:  Nothing Worsened by:  Bending and twisting Ineffective treatments:  None tried Associated symptoms: numbness and tingling   Associated symptoms: no abdominal pain, no abdominal swelling, no bowel incontinence, no chest pain, no dysuria, no fever, no headaches, no paresthesias, no pelvic pain, no perianal numbness and no weakness   Neck Injury This is a chronic problem. The current episode started more than 1 month ago. The problem occurs constantly. The problem has been gradually worsening. Associated symptoms include arthralgias, neck pain and numbness. Pertinent negatives include no abdominal pain, chest pain, fever, headaches, joint swelling, nausea, rash, sore throat, swollen glands, urinary symptoms, vertigo, visual change, vomiting or weakness. The symptoms are aggravated by twisting. She has tried oral narcotics and NSAIDs for the symptoms. The treatment provided moderate relief.   Patient states the she has chronic low back pain and neck pain.  States that she had lumbar surgery in September and is waiting for approval to have surgery on her neck.  Pain began as result of a on the job injury.   States she has been on vicodin at home w/o relief of pain.  She denies fever, new symptoms, incontinence of bladder or bowel, or headaches  Past Medical History  Diagnosis Date  . Arthritis   . Hyperlipidemia   . Hypertension   . Substance abuse   . Thyroid disease    Past Surgical History  Procedure Laterality Date  . Spine surgery  05/15/12  . Cesarean section  05/22/07  . Cholecystectomy  2009  . Knee surgery  07/2011    left   Family History  Problem Relation Age of Onset  . Cancer Mother     breast  . Cancer Maternal Aunt     skin cancer on face  . Cancer Paternal Uncle     colon  . Cancer Maternal Grandfather     lung  . Cancer Paternal Grandfather     colon  . Cancer Cousin     colon   History  Substance Use Topics  . Smoking status: Current Every Day Smoker -- 0.50 packs/day    Types: Cigarettes  . Smokeless tobacco: Not on file  . Alcohol Use: No   OB History   Grav Para Term Preterm Abortions TAB SAB Ect Mult Living                 Review of Systems  Constitutional: Negative for fever.  HENT: Negative for sore throat and trouble swallowing.   Respiratory: Negative for chest tightness and shortness of breath.   Cardiovascular: Negative for chest pain.  Gastrointestinal: Negative for nausea, vomiting, abdominal pain, constipation and bowel incontinence.  Genitourinary: Negative for dysuria, hematuria, flank pain, decreased urine volume, difficulty urinating and pelvic pain.       No perineal numbness or incontinence of urine or feces  Musculoskeletal: Positive for arthralgias, back pain and neck pain. Negative for joint swelling and neck stiffness.  Skin: Negative for rash.  Neurological: Positive for tingling and numbness. Negative for vertigo, weakness, headaches and paresthesias.  Psychiatric/Behavioral: Negative for confusion.  All other systems reviewed and are negative.    Allergies  Latex; Ambien; and Sulfa antibiotics  Home Medications    Current Outpatient Rx  Name  Route  Sig  Dispense  Refill  . AMITRIPTYLINE HCL PO   Oral   Take 2 capsules by mouth at bedtime.         . gabapentin (NEURONTIN) 300 MG capsule   Oral   Take 900 mg by mouth 3 (three) times daily.          Marland Kitchen levothyroxine (SYNTHROID, LEVOTHROID) 50 MCG tablet   Oral   Take 50 mcg by mouth every morning.          . Multiple Vitamins-Minerals (MULTIVITAMIN WITH MINERALS) tablet   Oral   Take 1 tablet by mouth at bedtime.          Marland Kitchen omeprazole (PRILOSEC) 40 MG capsule   Oral   Take 40 mg by mouth every morning.          Marland Kitchen tiZANidine (ZANAFLEX) 4 MG tablet   Oral   Take 4 mg by mouth at bedtime. May take one tablet three times daily         . traMADol (ULTRAM) 50 MG tablet   Oral   Take 100 mg by mouth 3 (three) times daily.         Marland Kitchen triamterene-hydrochlorothiazide (MAXZIDE-25) 37.5-25 MG per tablet   Oral   Take 1 tablet by mouth every morning.          Marland Kitchen oxyCODONE-acetaminophen (PERCOCET/ROXICET) 5-325 MG per tablet   Oral   Take 1 tablet by mouth every 4 (four) hours as needed for severe pain.   20 tablet   0    BP 144/81  Pulse 92  Temp(Src) 98.1 F (36.7 C) (Oral)  Resp 20  Ht 5\' 7"  (1.702 m)  Wt 250 lb (113.399 kg)  BMI 39.15 kg/m2  SpO2 100%  LMP 10/05/2013 Physical Exam  Nursing note and vitals reviewed. Constitutional: She is oriented to person, place, and time. She appears well-developed and well-nourished. No distress.  HENT:  Head: Normocephalic and atraumatic.  Mouth/Throat: Oropharynx is clear and moist.  Eyes: EOM are normal. Pupils are equal, round, and reactive to light.  Neck: Phonation normal. Neck supple. Muscular tenderness present. No spinous process tenderness present. No rigidity. Decreased range of motion present. No erythema present. No Brudzinski's sign and no Kernig's sign noted. No thyromegaly present.  ttp of the bilateral cervical paraspinal muscles and along the bilateral  trapezius muscle.  Grip strength is strong and equal bilaterally.  Distal sensation intact,  CR < 2 sec.  Pain to the neck is reproduced with rotation and palpation.    Cardiovascular: Normal rate, regular rhythm, normal heart sounds and intact distal pulses.   No murmur heard. Pulmonary/Chest: Effort normal and breath sounds normal. No respiratory distress. She exhibits no tenderness.  Abdominal: Soft. She exhibits no distension. There is no tenderness. There is no rebound and no guarding.  Musculoskeletal: She exhibits tenderness. She exhibits no edema.  Cervical back: She exhibits tenderness. She exhibits normal range of motion, no bony tenderness, no swelling, no deformity, no spasm and normal pulse.       Lumbar back: She exhibits tenderness and pain. She exhibits normal range of motion, no swelling, no deformity, no laceration and normal pulse.  ttp of the lower lumbar spine and paraspinal muscles.  Surgical scar appears well healed.  No erythema or edema.  DP pulse brisk, Distal sensation intact, slightly diminished on the right.  No calf pain or edema.  Lymphadenopathy:    She has no cervical adenopathy.  Neurological: She is alert and oriented to person, place, and time. She has normal strength. No sensory deficit. She exhibits normal muscle tone. Coordination and gait normal.  Reflex Scores:      Tricep reflexes are 2+ on the right side and 2+ on the left side.      Bicep reflexes are 2+ on the right side and 2+ on the left side.      Patellar reflexes are 2+ on the right side and 2+ on the left side.      Achilles reflexes are 2+ on the right side and 2+ on the left side. Skin: Skin is warm and dry. No rash noted.    ED Course  Procedures (including critical care time) Labs Review Labs Reviewed  URINALYSIS, ROUTINE W REFLEX MICROSCOPIC - Abnormal; Notable for the following:    Hgb urine dipstick SMALL (*)    All other components within normal limits  URINE MICROSCOPIC-ADD  ON - Abnormal; Notable for the following:    Squamous Epithelial / LPF FEW (*)    All other components within normal limits  POCT PREGNANCY, URINE   Imaging Review No results found.  EKG Interpretation   None       MDM   1. Low back pain    Previous ED charts reviewed.  No concerning sx's for emergent neurological or infectious process.  Hx of acute on chronic low back and neck pain.  Patient is well appearing.  Non-toxic appearing.  No meningeal signs.   Patient feeling better after IM medications.  VSS.  Appears stable for d/c.  Agrees to f/u next week with her neurosurgeon.      Seve Monette L. Trisha Mangle, PA-C 10/11/13 2125

## 2013-10-12 NOTE — ED Provider Notes (Signed)
Medical screening examination/treatment/procedure(s) were performed by non-physician practitioner and as supervising physician I was immediately available for consultation/collaboration.  EKG Interpretation   None         Tawny Raspberry L Marticia Reifschneider, MD 10/12/13 0718 

## 2013-11-29 ENCOUNTER — Encounter (HOSPITAL_COMMUNITY): Payer: Self-pay | Admitting: Emergency Medicine

## 2013-11-29 ENCOUNTER — Emergency Department (HOSPITAL_COMMUNITY)
Admission: EM | Admit: 2013-11-29 | Discharge: 2013-11-29 | Disposition: A | Discharge status: Home / Self Care | Payer: Worker's Compensation | Attending: Emergency Medicine | Admitting: Emergency Medicine

## 2013-11-29 DIAGNOSIS — R5383 Other fatigue: Secondary | ICD-10-CM

## 2013-11-29 DIAGNOSIS — R300 Dysuria: Secondary | ICD-10-CM | POA: Insufficient documentation

## 2013-11-29 DIAGNOSIS — Z8639 Personal history of other endocrine, nutritional and metabolic disease: Secondary | ICD-10-CM | POA: Insufficient documentation

## 2013-11-29 DIAGNOSIS — Z79899 Other long term (current) drug therapy: Secondary | ICD-10-CM | POA: Insufficient documentation

## 2013-11-29 DIAGNOSIS — Z91040 Latex allergy status: Secondary | ICD-10-CM | POA: Insufficient documentation

## 2013-11-29 DIAGNOSIS — R209 Unspecified disturbances of skin sensation: Secondary | ICD-10-CM | POA: Insufficient documentation

## 2013-11-29 DIAGNOSIS — Z862 Personal history of diseases of the blood and blood-forming organs and certain disorders involving the immune mechanism: Secondary | ICD-10-CM | POA: Insufficient documentation

## 2013-11-29 DIAGNOSIS — I10 Essential (primary) hypertension: Secondary | ICD-10-CM | POA: Insufficient documentation

## 2013-11-29 DIAGNOSIS — F17200 Nicotine dependence, unspecified, uncomplicated: Secondary | ICD-10-CM | POA: Insufficient documentation

## 2013-11-29 DIAGNOSIS — R5381 Other malaise: Secondary | ICD-10-CM | POA: Insufficient documentation

## 2013-11-29 DIAGNOSIS — G8929 Other chronic pain: Secondary | ICD-10-CM

## 2013-11-29 DIAGNOSIS — M545 Low back pain, unspecified: Secondary | ICD-10-CM | POA: Insufficient documentation

## 2013-11-29 DIAGNOSIS — M129 Arthropathy, unspecified: Secondary | ICD-10-CM | POA: Insufficient documentation

## 2013-11-29 LAB — URINALYSIS, ROUTINE W REFLEX MICROSCOPIC
BILIRUBIN URINE: NEGATIVE
Glucose, UA: NEGATIVE mg/dL
Ketones, ur: NEGATIVE mg/dL
NITRITE: NEGATIVE
Protein, ur: 30 mg/dL — AB
SPECIFIC GRAVITY, URINE: 1.015 (ref 1.005–1.030)
Urobilinogen, UA: 0.2 mg/dL (ref 0.0–1.0)
pH: 6.5 (ref 5.0–8.0)

## 2013-11-29 LAB — URINE MICROSCOPIC-ADD ON

## 2013-11-29 MED ORDER — DIAZEPAM 5 MG/ML IJ SOLN
5.0000 mg | Freq: Once | INTRAMUSCULAR | Status: AC
  Administered 2013-11-29: 5 mg via INTRAMUSCULAR
  Filled 2013-11-29: qty 2

## 2013-11-29 MED ORDER — OXYCODONE-ACETAMINOPHEN 5-325 MG PO TABS: 1.0000 | ORAL_TABLET | ORAL | Status: DC | PRN

## 2013-11-29 MED ORDER — HYDROMORPHONE HCL PF 2 MG/ML IJ SOLN
1.0000 mg | Freq: Once | INTRAMUSCULAR | Status: AC
  Administered 2013-11-29: 1 mg via INTRAMUSCULAR
  Filled 2013-11-29: qty 1

## 2013-11-29 NOTE — ED Notes (Signed)
Patient states she just had her second back surgery due to an injury that happened at work. Patient had bulging disk L3-L6. Patient states, she was taking a shower yesterday when she slipped in the shower and fell. She states she now has new found back pain in her right lower back radiating to the right leg. Patient states she has chronic neck pain and chronic right leg pain that are present. The pain is not worsened or new. Patient has three bulging disks in the neck. Patient was sitting on the edge of the bed and in no acute distress.

## 2013-11-29 NOTE — ED Provider Notes (Signed)
CSN: [id number]     Arrival date & time 11/29/13  1026 History   First MD Initiated Contact with Patient 11/29/13 1111     Chief Complaint  Patient presents with  . Back Pain   (Consider location/radiation/quality/duration/timing/severity/associated sxs/prior Treatment) HPI Comments: Patient is 38 year old female with history of chronic lower back pain who had surgery for this in September 2014 in [facility], she states that she has been referred to a pain clinic but has not gotten into see them yet.  She states that because of kidney failure she is having to cut back on her neurontin.  She reports residual right leg pain and numbness to the bottom of her right foot since surgery.  She states that since she is in between pain clinic and seeing an MD, she has not been taking any pain medication.  She reports that she slipped in the shower 2 days ago and fell backwards into a chair, she states that her pain has increased but denies any increase in weakness, numbness, tingling, loss of control of bowels or bladder.  She reports some mild dysuria at this time but no other symptoms.  Patient is a 38 y.o. female presenting with back pain. The history is provided by the patient. No language interpreter was used.  Back Pain Location:  Lumbar spine Quality:  Aching Radiates to:  R posterior upper leg Pain severity:  Severe Pain is:  Same all the time Onset quality:  Gradual Timing:  Constant Progression:  Worsening Chronicity:  Chronic Context: falling   Relieved by:  Nothing Worsened by:  Nothing tried Ineffective treatments:  None tried Associated symptoms: dysuria, leg pain, numbness and weakness   Associated symptoms: no bladder incontinence, no bowel incontinence, no fever, no paresthesias, no pelvic pain and no perianal numbness     Past Medical History  Diagnosis Date  . Arthritis   . Hyperlipidemia   . Hypertension   . Substance abuse   . Thyroid disease    Past Surgical History    Procedure Laterality Date  . Spine surgery  05/15/12  . Cesarean section  05/22/07  . Cholecystectomy  2009  . Knee surgery  07/2011    left   Family History  Problem Relation Age of Onset  . Cancer Mother     breast  . Cancer Maternal Aunt     skin cancer on face  . Cancer Paternal Uncle     colon  . Cancer Maternal Grandfather     lung  . Cancer Paternal Grandfather     colon  . Cancer Cousin     colon   History  Substance Use Topics  . Smoking status: Current Every Day Smoker -- 0.50 packs/day    Types: Cigarettes  . Smokeless tobacco: Not on file  . Alcohol Use: No   OB History   Grav Para Term Preterm Abortions TAB SAB Ect Mult Living                 Review of Systems  Constitutional: Negative for fever.  Gastrointestinal: Negative for bowel incontinence.  Genitourinary: Positive for dysuria. Negative for bladder incontinence and pelvic pain.  Musculoskeletal: Positive for back pain.  Neurological: Positive for weakness and numbness. Negative for paresthesias.  All other systems reviewed and are negative.    Allergies  Latex; Ambien; and Sulfa antibiotics  Home Medications   Current Outpatient Rx  Name  Route  Sig  Dispense  Refill  . AMITRIPTYLINE  HCL PO   Oral   Take 2 capsules by mouth at bedtime.         . gabapentin (NEURONTIN) 300 MG capsule   Oral   Take 600 mg by mouth 3 (three) times daily.          Marland Kitchen. levothyroxine (SYNTHROID, LEVOTHROID) 50 MCG tablet   Oral   Take 50 mcg by mouth every morning.          . Multiple Vitamins-Minerals (MULTIVITAMIN WITH MINERALS) tablet   Oral   Take 1 tablet by mouth at bedtime.          Marland Kitchen. omeprazole (PRILOSEC) 40 MG capsule   Oral   Take 40 mg by mouth every morning.          Marland Kitchen. tiZANidine (ZANAFLEX) 4 MG tablet   Oral   Take 4 mg by mouth at bedtime. May take one tablet three times daily         . traMADol (ULTRAM) 50 MG tablet   Oral   Take 100 mg by mouth 3 (three) times daily.          Marland Kitchen. triamterene-hydrochlorothiazide (MAXZIDE-25) 37.5-25 MG per tablet   Oral   Take 1 tablet by mouth every morning.           BP 130/71  Pulse 101  Temp(Src) 98 F (36.7 C) (Oral)  Resp 18  Ht 5\' 7"  (1.702 m)  Wt 243 lb (110.224 kg)  BMI 38.05 kg/m2  SpO2 100%  LMP 11/28/2013 Physical Exam  Nursing note and vitals reviewed. Constitutional: She is oriented to person, place, and time. She appears well-developed and well-nourished. No distress.  HENT:  Head: Normocephalic and atraumatic.  Mouth/Throat: Oropharynx is clear and moist. No oropharyngeal exudate.  Eyes: Conjunctivae are normal. Pupils are equal, round, and reactive to light. No scleral icterus.  Neck: Normal range of motion. Neck supple.  Mild tenderness to palpation of cervical spine  Cardiovascular: Normal rate, regular rhythm and normal heart sounds.  Exam reveals no gallop and no friction rub.   No murmur heard. Pulmonary/Chest: Effort normal and breath sounds normal. No respiratory distress. She has no wheezes. She has no rales. She exhibits no tenderness.  Abdominal: Soft. Bowel sounds are normal. She exhibits no distension. There is no tenderness. There is no rebound and no guarding.  No CVA tenderness  Musculoskeletal: She exhibits tenderness.       Lumbar back: She exhibits decreased range of motion, tenderness and bony tenderness.       Back:  Lymphadenopathy:    She has no cervical adenopathy.  Neurological: She is alert and oriented to person, place, and time. She has normal reflexes. She exhibits normal muscle tone. Coordination normal.  Skin: Skin is warm and dry. No rash noted. No erythema. No pallor.  Psychiatric: She has a normal mood and affect. Her behavior is normal. Judgment and thought content normal.    ED Course  Procedures (including critical care time) Labs Review Labs Reviewed  URINALYSIS, ROUTINE W REFLEX MICROSCOPIC - Abnormal; Notable for the following:    Color, Urine RED  (*)    APPearance CLOUDY (*)    Hgb urine dipstick LARGE (*)    Protein, ur 30 (*)    Leukocytes, UA TRACE (*)    All other components within normal limits  URINE MICROSCOPIC-ADD ON - Abnormal; Notable for the following:    Bacteria, UA FEW (*)    All other components within normal limits  URINE CULTURE   Imaging Review No results found.  EKG Interpretation   None      Medications  HYDROmorphone (DILAUDID) injection 1 mg (1 mg Intramuscular Given 11/29/13 1205)  diazepam (VALIUM) injection 5 mg (5 mg Intramuscular Given 11/29/13 1210)     MDM  Lumbar sprain Chronic lower back pain  Patient here with acute on chronic lower back pain - no alarming signs to suggest cauda equina, epidural hematoma.  I do not feel that imaging is warranted at this time as well.  No signs of UTI.  Will discharge home on pain medication.  Mild improvement in pain since medications given here.   Izola Price Marisue Humble, PA-C 11/29/13 1331

## 2013-11-29 NOTE — ED Provider Notes (Signed)
Medical screening examination/treatment/procedure(s) were performed by non-physician practitioner and as supervising physician I was immediately available for consultation/collaboration.  EKG Interpretation   None         Sherlyne Crownover L Helmuth Recupero, MD 11/29/13 1609 

## 2013-11-29 NOTE — Discharge Instructions (Signed)
Back Pain, Adult  Low back pain is very common. About 1 in 5 people have back pain.The cause of low back pain is rarely dangerous. The pain often gets better over time.About half of people with a sudden onset of back pain feel better in just 2 weeks. About 8 in 10 people feel better by 6 weeks.   CAUSES  Some common causes of back pain include:   Strain of the muscles or ligaments supporting the spine.   Wear and tear (degeneration) of the spinal discs.   Arthritis.   Direct injury to the back.  DIAGNOSIS  Most of the time, the direct cause of low back pain is not known.However, back pain can be treated effectively even when the exact cause of the pain is unknown.Answering your caregiver's questions about your overall health and symptoms is one of the most accurate ways to make sure the cause of your pain is not dangerous. If your caregiver needs more information, he or she may order lab work or imaging tests (X-rays or MRIs).However, even if imaging tests show changes in your back, this usually does not require surgery.  HOME CARE INSTRUCTIONS  For many people, back pain returns.Since low back pain is rarely dangerous, it is often a condition that people can learn to manageon their own.    Remain active. It is stressful on the back to sit or stand in one place. Do not sit, drive, or stand in one place for more than 30 minutes at a time. Take short walks on level surfaces as soon as pain allows.Try to increase the length of time you walk each day.   Do not stay in bed.Resting more than 1 or 2 days can delay your recovery.   Do not avoid exercise or work.Your body is made to move.It is not dangerous to be active, even though your back may hurt.Your back will likely heal faster if you return to being active before your pain is gone.   Pay attention to your body when you bend and lift. Many people have less discomfortwhen lifting if they bend their knees, keep the load close to their bodies,and  avoid twisting. Often, the most comfortable positions are those that put less stress on your recovering back.   Find a comfortable position to sleep. Use a firm mattress and lie on your side with your knees slightly bent. If you lie on your back, put a pillow under your knees.   Only take over-the-counter or prescription medicines as directed by your caregiver. Over-the-counter medicines to reduce pain and inflammation are often the most helpful.Your caregiver may prescribe muscle relaxant drugs.These medicines help dull your pain so you can more quickly return to your normal activities and healthy exercise.   Put ice on the injured area.   Put ice in a plastic bag.   Place a towel between your skin and the bag.   Leave the ice on for 15-20 minutes, 03-04 times a day for the first 2 to 3 days. After that, ice and heat may be alternated to reduce pain and spasms.   Ask your caregiver about trying back exercises and gentle massage. This may be of some benefit.   Avoid feeling anxious or stressed.Stress increases muscle tension and can worsen back pain.It is important to recognize when you are anxious or stressed and learn ways to manage it.Exercise is a great option.  SEEK MEDICAL CARE IF:   You have pain that is not relieved with rest or   medicine.   You have pain that does not improve in 1 week.   You have new symptoms.   You are generally not feeling well.  SEEK IMMEDIATE MEDICAL CARE IF:    You have pain that radiates from your back into your legs.   You develop new bowel or bladder control problems.   You have unusual weakness or numbness in your arms or legs.   You develop nausea or vomiting.   You develop abdominal pain.   You feel faint.  Document Released: 11/06/2005 Document Revised: 05/07/2012 Document Reviewed: 03/27/2011  Exit[employer] Patient Information 2014 [employer].          Chronic Pain  Chronic pain can be defined as pain that is off and on and lasts for 3 6 months or longer.  Many things cause chronic pain, which can make it difficult to make a diagnosis. There are many treatment options available for chronic pain. However, finding a treatment that works well for you may require trying various approaches until the right one is found. Many people benefit from a combination of two or more types of treatment to control their pain.  SYMPTOMS   Chronic pain can occur anywhere in the body and can range from mild to very severe. Some types of chronic pain include:   Headache.   Low back pain.   Cancer pain.   Arthritis pain.   Neurogenic pain. This is pain resulting from damage to nerves.  People with chronic pain may also have other symptoms such as:   Depression.   Anger.   Insomnia.   Anxiety.  DIAGNOSIS   Your health care provider will help diagnose your condition over time. In many cases, the initial focus will be on excluding possible conditions that could be causing the pain. Depending on your symptoms, your health care provider may order tests to diagnose your condition. Some of these tests may include:    Blood tests.    CT scan.    MRI.    X-rays.    Ultrasounds.    Nerve conduction studies.   You may need to see a specialist.   TREATMENT   Finding treatment that works well may take time. You may be referred to a pain specialist. He or she may prescribe medicine or therapies, such as:    Mindful meditation or yoga.   Shots (injections) of numbing or pain-relieving medicines into the spine or area of pain.   Local electrical stimulation.   Acupuncture.    Massage therapy.    Aroma, color, light, or sound therapy.    Biofeedback.    Working with a physical therapist to keep from getting stiff.    Regular, gentle exercise.    Cognitive or behavioral therapy.    Group support.   Sometimes, surgery may be recommended.   HOME CARE INSTRUCTIONS    Take all medicines as directed by your health care provider.    Lessen stress in your life by relaxing  and doing things such as listening to calming music.    Exercise or be active as directed by your health care provider.    Eat a healthy diet and include things such as vegetables, fruits, fish, and lean meats in your diet.    Keep all follow-up appointments with your health care provider.    Attend a support group with others suffering from chronic pain.  SEEK MEDICAL CARE IF:    Your pain gets worse.    You develop   a new pain that was not there before.    You cannot tolerate medicines given to you by your health care provider.    You have new symptoms since your last visit with your health care provider.   SEEK IMMEDIATE MEDICAL CARE IF:    You feel weak.    You have decreased sensation or numbness.    You lose control of bowel or bladder function.    Your pain suddenly gets much worse.    You develop shaking.   You develop chills.   You develop confusion.   You develop chest pain.   You develop shortness of breath.   MAKE SURE YOU:   Understand these instructions.   Will watch your condition.   Will get help right away if you are not doing well or get worse.  Document Released: 07/29/2002 Document Revised: 07/09/2013 Document Reviewed: 05/02/2013  Exit[employer] Patient Information 2014 [employer].

## 2013-11-29 NOTE — ED Notes (Signed)
Pt reports had recent back surgery and accidentally slipped in the shower 2 days ago.  Pt says has been unable to sleep at night due to back pain.

## 2013-11-30 LAB — URINE CULTURE

## 2014-02-08 ENCOUNTER — Emergency Department (HOSPITAL_COMMUNITY)
Admission: EM | Admit: 2014-02-08 | Discharge: 2014-02-08 | Disposition: A | Discharge status: Home / Self Care | Payer: Worker's Compensation | Attending: Emergency Medicine | Admitting: Emergency Medicine

## 2014-02-08 ENCOUNTER — Emergency Department (HOSPITAL_COMMUNITY): Payer: Worker's Compensation

## 2014-02-08 ENCOUNTER — Encounter (HOSPITAL_COMMUNITY): Payer: Self-pay | Admitting: Emergency Medicine

## 2014-02-08 DIAGNOSIS — R079 Chest pain, unspecified: Secondary | ICD-10-CM | POA: Insufficient documentation

## 2014-02-08 DIAGNOSIS — E079 Disorder of thyroid, unspecified: Secondary | ICD-10-CM | POA: Insufficient documentation

## 2014-02-08 DIAGNOSIS — F17200 Nicotine dependence, unspecified, uncomplicated: Secondary | ICD-10-CM | POA: Insufficient documentation

## 2014-02-08 DIAGNOSIS — G8929 Other chronic pain: Secondary | ICD-10-CM | POA: Insufficient documentation

## 2014-02-08 DIAGNOSIS — M549 Dorsalgia, unspecified: Secondary | ICD-10-CM | POA: Insufficient documentation

## 2014-02-08 DIAGNOSIS — G479 Sleep disorder, unspecified: Secondary | ICD-10-CM | POA: Insufficient documentation

## 2014-02-08 DIAGNOSIS — Z8739 Personal history of other diseases of the musculoskeletal system and connective tissue: Secondary | ICD-10-CM | POA: Insufficient documentation

## 2014-02-08 DIAGNOSIS — R002 Palpitations: Secondary | ICD-10-CM

## 2014-02-08 DIAGNOSIS — Z3202 Encounter for pregnancy test, result negative: Secondary | ICD-10-CM | POA: Insufficient documentation

## 2014-02-08 DIAGNOSIS — I10 Essential (primary) hypertension: Secondary | ICD-10-CM | POA: Insufficient documentation

## 2014-02-08 DIAGNOSIS — M7989 Other specified soft tissue disorders: Secondary | ICD-10-CM | POA: Insufficient documentation

## 2014-02-08 DIAGNOSIS — Z79899 Other long term (current) drug therapy: Secondary | ICD-10-CM | POA: Insufficient documentation

## 2014-02-08 DIAGNOSIS — Z91040 Latex allergy status: Secondary | ICD-10-CM | POA: Insufficient documentation

## 2014-02-08 LAB — BASIC METABOLIC PANEL
BUN: 6 mg/dL (ref 6–23)
CHLORIDE: 98 meq/L (ref 96–112)
CO2: 27 meq/L (ref 19–32)
Calcium: 9.7 mg/dL (ref 8.4–10.5)
Creatinine, Ser: 0.84 mg/dL (ref 0.50–1.10)
GFR calc Af Amer: >90 mL/min (ref >90)
GFR calc non Af Amer: 88 mL/min — ABNORMAL LOW (ref >90)
Glucose, Bld: 134 mg/dL — ABNORMAL HIGH (ref 70–99)
POTASSIUM: 3.5 meq/L — AB (ref 3.7–5.3)
SODIUM: 138 meq/L (ref 137–147)

## 2014-02-08 LAB — CBC WITH DIFFERENTIAL/PLATELET
Basophils Absolute: 0 K/uL (ref 0.0–0.1)
Basophils Relative: 0 % (ref 0–1)
Eosinophils Absolute: 0.3 K/uL (ref 0.0–0.7)
Eosinophils Relative: 4 % (ref 0–5)
HCT: 42.8 % (ref 36.0–46.0)
Hemoglobin: 14.7 g/dL (ref 12.0–15.0)
Lymphocytes Relative: 32 % (ref 12–46)
Lymphs Abs: 2.5 K/uL (ref 0.7–4.0)
MCH: 30.4 pg (ref 26.0–34.0)
MCHC: 34.3 g/dL (ref 30.0–36.0)
MCV: 88.6 fL (ref 78.0–100.0)
Monocytes Absolute: 0.6 K/uL (ref 0.1–1.0)
Monocytes Relative: 8 % (ref 3–12)
Neutro Abs: 4.5 K/uL (ref 1.7–7.7)
Neutrophils Relative %: 57 % (ref 43–77)
Platelets: 388 K/uL (ref 150–400)
RBC: 4.83 MIL/uL (ref 3.87–5.11)
RDW: 13.3 % (ref 11.5–15.5)
WBC: 8 K/uL (ref 4.0–10.5)

## 2014-02-08 LAB — URINALYSIS, ROUTINE W REFLEX MICROSCOPIC
Bilirubin Urine: NEGATIVE
Glucose, UA: NEGATIVE mg/dL
Ketones, ur: NEGATIVE mg/dL
Leukocytes, UA: NEGATIVE
Nitrite: NEGATIVE
Protein, ur: NEGATIVE mg/dL
Specific Gravity, Urine: 1.01 (ref 1.005–1.030)
Urobilinogen, UA: 0.2 mg/dL (ref 0.0–1.0)
pH: 6 (ref 5.0–8.0)

## 2014-02-08 LAB — URINE MICROSCOPIC-ADD ON

## 2014-02-08 LAB — D-DIMER, QUANTITATIVE (NOT AT ARMC): D DIMER QUANT: 0.52 ug{FEU}/mL — AB (ref 0.00–0.48)

## 2014-02-08 LAB — TROPONIN I: Troponin I: <0.3 ng/mL (ref <0.30)

## 2014-02-08 LAB — PREGNANCY, URINE: Preg Test, Ur: NEGATIVE

## 2014-02-08 MED ORDER — KETOROLAC TROMETHAMINE 30 MG/ML IJ SOLN
30.0000 mg | Freq: Once | INTRAMUSCULAR | Status: AC
  Administered 2014-02-08: 30 mg via INTRAMUSCULAR
  Filled 2014-02-08: qty 1

## 2014-02-08 MED ORDER — HYDROCODONE-ACETAMINOPHEN 5-325 MG PO TABS: 1.0000 | ORAL_TABLET | Freq: Four times a day (QID) | ORAL | Status: DC | PRN

## 2014-02-08 MED ORDER — IOHEXOL 350 MG/ML SOLN
100.0000 mL | Freq: Once | INTRAVENOUS | Status: AC | PRN
  Administered 2014-02-08: 100 mL via INTRAVENOUS

## 2014-02-08 NOTE — ED Notes (Signed)
Pt alert & oriented x4, stable gait. Patient given discharge instructions, paperwork & prescription(s). Patient  instructed to stop at the registration desk to finish any additional paperwork. Patient verbalized understanding. Pt left department w/ no further questions. 

## 2014-02-08 NOTE — Discharge Instructions (Signed)
Edema Edema is an abnormal build-up of fluids in tissues. Because this is partly dependent on gravity (water flows to the lowest place), it is more common in the legs and thighs (lower extremities). It is also common in the looser tissues, like around the eyes. Painless swelling of the feet and ankles is common and increases as a person ages. It may affect both legs and may include the calves or even thighs. When squeezed, the fluid may move out of the affected area and may leave a dent for a few moments. CAUSES   Prolonged standing or sitting in one place for extended periods of time. Movement helps pump tissue fluid into the veins, and absence of movement prevents this, resulting in edema.  Varicose veins. The valves in the veins do not work as well as they should. This causes fluid to leak into the tissues.  Fluid and salt overload.  Injury, burn, or surgery to the leg, ankle, or foot, may damage veins and allow fluid to leak out.  Sunburn damages vessels. Leaky vessels allow fluid to go out into the sunburned tissues.  Allergies (from insect bites or stings, medications or chemicals) cause swelling by allowing vessels to become leaky.  Protein in the blood helps keep fluid in your vessels. Low protein, as in malnutrition, allows fluid to leak out.  Hormonal changes, including pregnancy and menstruation, cause fluid retention. This fluid may leak out of vessels and cause edema.  Medications that cause fluid retention. Examples are sex hormones, blood pressure medications, steroid treatment, or anti-depressants.  Some illnesses cause edema, especially heart failure, kidney disease, or liver disease.  Surgery that cuts veins or lymph nodes, such as surgery done for the heart or for breast cancer, may result in edema. DIAGNOSIS  Your caregiver is usually easily able to determine what is causing your swelling (edema) by simply asking what is wrong (getting a history) and examining you (doing  a physical). Sometimes x-rays, EKG (electrocardiogram or heart tracing), and blood work may be done to evaluate for underlying medical illness. TREATMENT  General treatment includes:  Leg elevation (or elevation of the affected body part).  Restriction of fluid intake.  Prevention of fluid overload.  Compression of the affected body part. Compression with elastic bandages or support stockings squeezes the tissues, preventing fluid from entering and forcing it back into the blood vessels.  Diuretics (also called water pills or fluid pills) pull fluid out of your body in the form of increased urination. These are effective in reducing the swelling, but can have side effects and must be used only under your caregiver's supervision. Diuretics are appropriate only for some types of edema. The specific treatment can be directed at any underlying causes discovered. Heart, liver, or kidney disease should be treated appropriately. HOME CARE INSTRUCTIONS   Elevate the legs (or affected body part) above the level of the heart, while lying down.  Avoid sitting or standing still for prolonged periods of time.  Avoid putting anything directly under the knees when lying down, and do not wear constricting clothing or garters on the upper legs.  Exercising the legs causes the fluid to work back into the veins and lymphatic channels. This may help the swelling go down.  The pressure applied by elastic bandages or support stockings can help reduce ankle swelling.  A low-salt diet may help reduce fluid retention and decrease the ankle swelling.  Take any medications exactly as prescribed. SEEK MEDICAL CARE IF:  Your edema is  not responding to recommended treatments. SEEK IMMEDIATE MEDICAL CARE IF:   You develop shortness of breath or chest pain.  You cannot breathe when you lay down; or if, while lying down, you have to get up and go to the window to get your breath.  You are having increasing  swelling without relief from treatment.  You develop a fever over 102 F (38.9 C).  You develop pain or redness in the areas that are swollen.  Tell your caregiver right away if you have gained 03 lb/1.4 kg in 1 day or 05 lb/2.3 kg in a week. MAKE SURE YOU:   Understand these instructions.  Will watch your condition.  Will get help right away if you are not doing well or get worse. Document Released: 11/06/2005 Document Revised: 05/07/2012 Document Reviewed: 06/24/2008 [facility] Patient Information 2014 [place], [place]. Palpitations  A palpitation is the feeling that your heartbeat is irregular or is faster than normal. It may feel like your heart is fluttering or skipping a beat. Palpitations are usually not a serious problem. However, in some cases, you may need further medical evaluation. CAUSES  Palpitations can be caused by:  Smoking.  Caffeine or other stimulants, such as diet pills or energy drinks.  Alcohol.  Stress and anxiety.  Strenuous physical activity.  Fatigue.  Certain medicines.  Heart disease, especially if you have a history of arrhythmias. This includes atrial fibrillation, atrial flutter, or supraventricular tachycardia.  An improperly working pacemaker or defibrillator. DIAGNOSIS  To find the cause of your palpitations, your caregiver will take your history and perform a physical exam. Tests may also be done, including:  Electrocardiography (ECG). This test records the heart's electrical activity.  Cardiac monitoring. This allows your caregiver to monitor your heart rate and rhythm in real time.  Holter monitor. This is a portable device that records your heartbeat and can help diagnose heart arrhythmias. It allows your caregiver to track your heart activity for several days, if needed.  Stress tests by exercise or by giving medicine that makes the heart beat faster. TREATMENT  Treatment of palpitations depends on the cause of your symptoms and  can vary greatly. Most cases of palpitations do not require any treatment other than time, relaxation, and monitoring your symptoms. Other causes, such as atrial fibrillation, atrial flutter, or supraventricular tachycardia, usually require further treatment. HOME CARE INSTRUCTIONS   Avoid:  Caffeinated coffee, tea, soft drinks, diet pills, and energy drinks.  Chocolate.  Alcohol.  Stop smoking if you smoke.  Reduce your stress and anxiety. Things that can help you relax include:  A method that measures bodily functions so you can learn to control them (biofeedback).  Yoga.  Meditation.  Physical activity such as swimming, jogging, or walking.  Get plenty of rest and sleep. SEEK MEDICAL CARE IF:   You continue to have a fast or irregular heartbeat beyond 24 hours.  Your palpitations occur more often. SEEK IMMEDIATE MEDICAL CARE IF:  You develop chest pain or shortness of breath.  You have a severe headache.  You feel dizzy, or you faint. MAKE SURE YOU:  Understand these instructions.  Will watch your condition.  Will get help right away if you are not doing well or get worse. Document Released: 11/03/2000 Document Revised: 03/03/2013 Document Reviewed: 01/05/2012 [facility] Patient Information 2014 [place]ExitCare, [place]LL[place].

## 2014-02-08 NOTE — ED Provider Notes (Addendum)
CSN: [id number]     Arrival date & time 02/08/14  1524 History  This chart was scribed for Shon Baton, MD by Dorothey Baseman, ED Scribe. This patient was seen in room APA16A/APA16A and the patient's care was started at 6:44 PM.    Chief Complaint  Patient presents with  . swelling in legs   . Back Pain   The history is provided by the patient. No language interpreter was used.   HPI Comments: Natasha Cain is a 38 y.o. Female with a history of HTN and hyperlipidemia who presents to the Emergency Department complaining of a constant pain to the lower back that she states radiates down the right leg and is chronic in nature secondary to a spinal surgery in 2013. She states that she has had some swelling to the bilateral legs onset about a week ago that she states presented while she was taking Neurontin, so she was switched to Indomethacin, but that the swelling has persisted. Patient states that she has also been prescribed Zorvolex for her chronic pain, which caused her to have some adverse effects, so she has since stopped taking it. She reports taking 800 mg ibuprofen at home without relief. She reports that she is not currently prescribed or taking any other pain medications. Patient reports that she is currently on her menstrual period. Patient also has a history of arthritis, thyroid disease, and substance abuse.   Patient also reports some intermittent chest pain that she states may be secondary to some anxiety and recent decreases in sleep. She denies any chest pain currently. She denies shortness of breath. Patient reports an allergy to Ambien.   Past Medical History  Diagnosis Date  . Arthritis   . Hyperlipidemia   . Hypertension   . Substance abuse   . Thyroid disease    Past Surgical History  Procedure Laterality Date  . Spine surgery  05/15/12  . Cesarean section  05/22/07  . Cholecystectomy  2009  . Knee surgery  07/2011    left   Family History  Problem Relation Age of  Onset  . Cancer Mother     breast  . Cancer Maternal Aunt     skin cancer on face  . Cancer Paternal Uncle     colon  . Cancer Maternal Grandfather     lung  . Cancer Paternal Grandfather     colon  . Cancer Cousin     colon   History  Substance Use Topics  . Smoking status: Current Every Day Smoker -- 0.50 packs/day    Types: Cigarettes  . Smokeless tobacco: Not on file  . Alcohol Use: No   OB History   Grav Para Term Preterm Abortions TAB SAB Ect Mult Living                 Review of Systems  Respiratory: Negative for shortness of breath.   Cardiovascular: Positive for chest pain and leg swelling.  Musculoskeletal: Positive for back pain (chronic).  Psychiatric/Behavioral: Positive for sleep disturbance.  All other systems reviewed and are negative.      Allergies  Latex; Zorvolex; Ambien; and Sulfa antibiotics  Home Medications   Current Outpatient Rx  Name  Route  Sig  Dispense  Refill  . Diclofenac (ZORVOLEX PO)   Oral   Take 1 capsule by mouth daily.         Marland Kitchen doxepin (SINEQUAN) 25 MG capsule   Oral   Take 75 mg by mouth  at bedtime.         . INDOMETHACIN PO   Oral   Take 1 capsule by mouth daily.         Marland Kitchen. levothyroxine (SYNTHROID, LEVOTHROID) 50 MCG tablet   Oral   Take 50 mcg by mouth at bedtime.          . Multiple Vitamins-Minerals (MULTIVITAMIN WITH MINERALS) tablet   Oral   Take 1 tablet by mouth at bedtime.          Marland Kitchen. omeprazole (PRILOSEC) 40 MG capsule   Oral   Take 40 mg by mouth every morning.          Marland Kitchen. tiZANidine (ZANAFLEX) 4 MG tablet   Oral   Take 8 mg by mouth at bedtime.          . triamterene-hydrochlorothiazide (MAXZIDE-25) 37.5-25 MG per tablet   Oral   Take 1 tablet by mouth every morning.          Marland Kitchen. HYDROcodone-acetaminophen (NORCO/VICODIN) 5-325 MG per tablet   Oral   Take 1 tablet by mouth every 6 (six) hours as needed.   10 tablet   0    Triage Vitals: BP 148/107  Pulse 111  Temp(Src)  98.2 F (36.8 C) (Oral)  Resp 20  Ht 5\' 7"  (1.702 m)  Wt 240 lb (108.863 kg)  BMI 37.58 kg/m2  SpO2 98%  LMP 02/06/2014  Physical Exam  Nursing note and vitals reviewed. Constitutional: She is oriented to person, place, and time. She appears well-developed and well-nourished.  HENT:  Head: Normocephalic and atraumatic.  Cardiovascular: Normal rate, regular rhythm and normal heart sounds.   No murmur heard. Pulmonary/Chest: Effort normal and breath sounds normal. No respiratory distress. She has no wheezes.  Abdominal: Soft. There is no tenderness.  Musculoskeletal: She exhibits edema.  2+ BLE edema  Neurological: She is alert and oriented to person, place, and time.  Skin: Skin is warm and dry.  Psychiatric:  Anxious appearing    ED Course  Procedures (including critical care time)  DIAGNOSTIC STUDIES: Oxygen Saturation is 98% on room air, normal by my interpretation.    COORDINATION OF CARE: 6:49 PM-Will order UA, EKG, and blood labs (D-dimer, troponin, CBC, BMP). Will order Toradol to manage symptoms. Discussed treatment plan with patient at bedside and patient verbalized agreement.   7:32 PM- Independently reviewed preliminary lab results and discussed the implications with the patient. Ordered a CT of the chest. Discussed treatment plan with patient at bedside and patient verbalized agreement.    Labs Review Labs Reviewed  D-DIMER, QUANTITATIVE - Abnormal; Notable for the following:    D-Dimer, Quant 0.52 (*)    All other components within normal limits  BASIC METABOLIC PANEL - Abnormal; Notable for the following:    Potassium 3.5 (*)    Glucose, Bld 134 (*)    GFR calc non Af Amer 88 (*)    All other components within normal limits  URINALYSIS, ROUTINE W REFLEX MICROSCOPIC - Abnormal; Notable for the following:    Color, Urine RED (*)    APPearance CLOUDY (*)    Hgb urine dipstick LARGE (*)    All other components within normal limits  TROPONIN I  CBC WITH  DIFFERENTIAL  URINE MICROSCOPIC-ADD ON  PREGNANCY, URINE   Imaging Review Ct Angio Chest Pe W/cm &/or Wo Cm  02/08/2014   CLINICAL DATA:  Swelling in the lower legs for the past week. Chest discomfort.  EXAM: CT ANGIOGRAPHY  CHEST WITH CONTRAST  TECHNIQUE: Multidetector CT imaging of the chest was performed using the standard protocol during bolus administration of intravenous contrast. Multiplanar CT image reconstructions and MIPs were obtained to evaluate the vascular anatomy.  CONTRAST:  OMNIPAQUE IOHEXOL 350 MG/ML SOLN  COMPARISON:  No priors.  FINDINGS: Mediastinum: There are no filling defects within the pulmonary arterial tree to suggest underlying pulmonary embolism. Heart size is normal. There is no significant pericardial fluid, thickening or pericardial calcification. No pathologically enlarged mediastinal or hilar lymph nodes. Esophagus is unremarkable in appearance.  Lungs/Pleura: There are a few small foci of ill-defined ground-glass attenuation in the lungs bilaterally, most pronounced in the left upper lobe, potentially indicated of active infection or inflammation. The is best demonstrated in the left upper lobe on image 46 of series 9. No other frank consolidative airspace disease. No pleural effusions. No pneumothorax.  Upper Abdomen: Unremarkable.  Musculoskeletal: There are no aggressive appearing lytic or blastic lesions noted in the visualized portions of the skeleton.  Review of the MIP images confirms the above findings.  IMPRESSION: 1. No evidence of pulmonary embolism. 2. There are few nonspecific ill-defined areas of ground-glass attenuation in the lungs bilaterally, as above, favored to be of infectious or inflammatory etiology. 3. No other acute findings.   Electronically Signed   By: Trudie Reed M.D.   On: 02/08/2014 21:05     EKG Interpretation None     EKG independently reviewed by myself: Sinus tachycardia with a rate of 107, no evidence of acute ST elevation  or ischemia MDM   Final diagnoses:  Chronic back pain  Leg swelling  Palpitations    Patient presents with back pain, leg swelling, palpitations. Patient has symmetric leg swelling on exam. No evidence of cauda equina. Vital signs notable for tachycardia. Lab work obtained including d-dimer to evaluate for PE. D-dimer positive. No evidence of significant protein in urine. CTA negative for PE.  EKG nonischemic and troponin negative. Patient was given Norco for pain. At this time I'm not sure the etiology of her leg swelling which the patient relates to medication changes. Will refer back to primary care physician. In the meantime have given patient a short course of Norco for pain management.  After history, exam, and medical workup I feel the patient has been appropriately medically screened and is safe for discharge home. Pertinent diagnoses were discussed with the patient. Patient was given return precautions.   I personally performed the services described in this documentation, which was scribed in my presence. The recorded information has been reviewed and is accurate.     Shon Baton, MD 02/09/14 1534  Shon Baton, MD 02/16/14 [phone]

## 2014-02-08 NOTE — ED Notes (Addendum)
Pt c/o swelling in lower legs x 1 week.  Reports swelling had gone away until MD recently changed her meds.    Reports chronic lower back pain and nerve damage.     Pt also says feels like chest has a "clamp" on it.  PT says feels like is anxiety from lack of sleep.

## 2014-03-23 ENCOUNTER — Encounter (HOSPITAL_COMMUNITY): Payer: Self-pay | Admitting: Emergency Medicine

## 2014-03-23 ENCOUNTER — Emergency Department (HOSPITAL_COMMUNITY)
Admission: EM | Admit: 2014-03-23 | Discharge: 2014-03-23 | Disposition: A | Discharge status: Home / Self Care | Payer: Worker's Compensation | Attending: Emergency Medicine | Admitting: Emergency Medicine

## 2014-03-23 DIAGNOSIS — F411 Generalized anxiety disorder: Secondary | ICD-10-CM | POA: Insufficient documentation

## 2014-03-23 DIAGNOSIS — E079 Disorder of thyroid, unspecified: Secondary | ICD-10-CM | POA: Insufficient documentation

## 2014-03-23 DIAGNOSIS — Z791 Long term (current) use of non-steroidal anti-inflammatories (NSAID): Secondary | ICD-10-CM | POA: Insufficient documentation

## 2014-03-23 DIAGNOSIS — M129 Arthropathy, unspecified: Secondary | ICD-10-CM | POA: Insufficient documentation

## 2014-03-23 DIAGNOSIS — F419 Anxiety disorder, unspecified: Secondary | ICD-10-CM

## 2014-03-23 DIAGNOSIS — Z79899 Other long term (current) drug therapy: Secondary | ICD-10-CM | POA: Insufficient documentation

## 2014-03-23 DIAGNOSIS — Z9889 Other specified postprocedural states: Secondary | ICD-10-CM | POA: Insufficient documentation

## 2014-03-23 DIAGNOSIS — IMO0002 Reserved for concepts with insufficient information to code with codable children: Secondary | ICD-10-CM | POA: Insufficient documentation

## 2014-03-23 DIAGNOSIS — Z91040 Latex allergy status: Secondary | ICD-10-CM | POA: Insufficient documentation

## 2014-03-23 DIAGNOSIS — I10 Essential (primary) hypertension: Secondary | ICD-10-CM | POA: Insufficient documentation

## 2014-03-23 DIAGNOSIS — M5410 Radiculopathy, site unspecified: Secondary | ICD-10-CM

## 2014-03-23 DIAGNOSIS — R079 Chest pain, unspecified: Secondary | ICD-10-CM | POA: Insufficient documentation

## 2014-03-23 DIAGNOSIS — F17200 Nicotine dependence, unspecified, uncomplicated: Secondary | ICD-10-CM | POA: Insufficient documentation

## 2014-03-23 DIAGNOSIS — G4700 Insomnia, unspecified: Secondary | ICD-10-CM | POA: Insufficient documentation

## 2014-03-23 LAB — TROPONIN I: Troponin I: <0.3 ng/mL (ref <0.30)

## 2014-03-23 MED ORDER — PREGABALIN 50 MG PO CAPS: 50.0000 mg | ORAL_CAPSULE | Freq: Every day | ORAL | Status: DC

## 2014-03-23 MED ORDER — DIAZEPAM 5 MG PO TABS: 5.0000 mg | ORAL_TABLET | Freq: Three times a day (TID) | ORAL | Status: DC | PRN

## 2014-03-23 NOTE — ED Notes (Addendum)
Chest pain, with sob for over 1 month. Pain  Rt leg  Says she has not been well since she had spinal surgery Sept 2014.  Thinks her cp is due to stress from cont . Back and leg pain.

## 2014-03-23 NOTE — ED Notes (Signed)
Patient given discharge instruction, verbalized understand. Patient ambulatory out of the department.  

## 2014-03-23 NOTE — Discharge Instructions (Signed)
Chest Pain (Nonspecific) °It is often hard to give a specific diagnosis for the cause of chest pain. There is always a chance that your pain could be related to something serious, such as a heart attack or a blood clot in the lungs. You need to follow up with your caregiver for further evaluation. °CAUSES  °· Heartburn. °· Pneumonia or bronchitis. °· Anxiety or stress. °· Inflammation around your heart (pericarditis) or lung (pleuritis or pleurisy). °· A blood clot in the lung. °· A collapsed lung (pneumothorax). It can develop suddenly on its own (spontaneous pneumothorax) or from injury (trauma) to the chest. °· Shingles infection (herpes zoster virus). °The chest wall is composed of bones, muscles, and cartilage. Any of these can be the source of the pain. °· The bones can be bruised by injury. °· The muscles or cartilage can be strained by coughing or overwork. °· The cartilage can be affected by inflammation and become sore (costochondritis). °DIAGNOSIS  °Lab tests or other studies, such as X-rays, electrocardiography, stress testing, or cardiac imaging, may be needed to find the cause of your pain.  °TREATMENT  °· Treatment depends on what may be causing your chest pain. Treatment may include: °· Acid blockers for heartburn. °· Anti-inflammatory medicine. °· Pain medicine for inflammatory conditions. °· Antibiotics if an infection is present. °· You may be advised to change lifestyle habits. This includes stopping smoking and avoiding alcohol, caffeine, and chocolate. °· You may be advised to keep your head raised (elevated) when sleeping. This reduces the chance of acid going backward from your stomach into your esophagus. °· Most of the time, nonspecific chest pain will improve within 2 to 3 days with rest and mild pain medicine. °HOME CARE INSTRUCTIONS  °· If antibiotics were prescribed, take your antibiotics as directed. Finish them even if you start to feel better. °· For the next few days, avoid physical  activities that bring on chest pain. Continue physical activities as directed. °· Do not smoke. °· Avoid drinking alcohol. °· Only take over-the-counter or prescription medicine for pain, discomfort, or fever as directed by your caregiver. °· Follow your caregiver's suggestions for further testing if your chest pain does not go away. °· Keep any follow-up appointments you made. If you do not go to an appointment, you could develop lasting (chronic) problems with pain. If there is any problem keeping an appointment, you must call to reschedule. °SEEK MEDICAL CARE IF:  °· You think you are having problems from the medicine you are taking. Read your medicine instructions carefully. °· Your chest pain does not go away, even after treatment. °· You develop a rash with blisters on your chest. °SEEK IMMEDIATE MEDICAL CARE IF:  °· You have increased chest pain or pain that spreads to your arm, neck, jaw, back, or abdomen. °· You develop shortness of breath, an increasing cough, or you are coughing up blood. °· You have severe back or abdominal pain, feel nauseous, or vomit. °· You develop severe weakness, fainting, or chills. °· You have a fever. °THIS IS AN EMERGENCY. Do not wait to see if the pain will go away. Get medical help at once. Call your local emergency services (911 in [place] Do not drive yourself to the hospital. °MAKE SURE YOU:  °· Understand these instructions. °· Will watch your condition. °· Will get help right away if you are not doing well or get worse. °Document Released: 08/16/2005 Document Revised: 01/29/2012 Document Reviewed: 06/11/2008 °[employer]® Patient Information ©2014 [employer]. ° °

## 2014-03-23 NOTE — ED Notes (Signed)
Pt is tearful, back and leg pain 9/10 worse than chest. Pt is not sleeping at night. Pt states she is frustrated with the whole process and workmens comp.

## 2014-03-23 NOTE — ED Provider Notes (Signed)
CSN: [id number]     Arrival date & time 03/23/14  1726 History   First MD Initiated Contact with Patient 03/23/14 1745     Chief Complaint  Patient presents with  . Chest Pain     HPI  Patient presents with a complaint of chest pain. She states she had rods put in her back surgery last fall. She is on Neurontin for right leg radicular pain. They cause edema. She was tapered off of the Neurontin. Marthe PatchHeard he went away. However pain returned. She has pain in her right leg posteriorly. She states it is not bad during the day but at night she gets leg spasms. The pain in movement of her leg keep her awake. She has not slept well. She sleep deprived. She is very anxious. Seen and evaluated here within the last week he had a negative CT angiogram of her chest. Her symptoms persist. Her physician has requested an outpatient mild scan of her chest. She called her physician today to talk about something different for her leg pain. She told him that she was still having the pain in her chest and he referred her here "just to get an EKG to be sure".  Past Medical History  Diagnosis Date  . Arthritis   . Hyperlipidemia   . Hypertension   . Substance abuse   . Thyroid disease    Past Surgical History  Procedure Laterality Date  . Spine surgery  05/15/12  . Cesarean section  05/22/07  . Cholecystectomy  2009  . Knee surgery  07/2011    left   Family History  Problem Relation Age of Onset  . Cancer Mother     breast  . Cancer Maternal Aunt     skin cancer on face  . Cancer Paternal Uncle     colon  . Cancer Maternal Grandfather     lung  . Cancer Paternal Grandfather     colon  . Cancer Cousin     colon   History  Substance Use Topics  . Smoking status: Current Every Day Smoker -- 0.50 packs/day    Types: Cigarettes  . Smokeless tobacco: Not on file  . Alcohol Use: No   OB History   Grav Para Term Preterm Abortions TAB SAB Ect Mult Living                 Review of Systems   Constitutional: Negative for fever, chills, diaphoresis, appetite change and fatigue.       Insomnia  HENT: Negative for mouth sores, sore throat and trouble swallowing.   Eyes: Negative for visual disturbance.  Respiratory: Negative for cough, chest tightness, shortness of breath and wheezing.   Cardiovascular: Positive for chest pain.  Gastrointestinal: Negative for nausea, vomiting, abdominal pain, diarrhea and abdominal distention.  Endocrine: Negative for polydipsia, polyphagia and polyuria.  Genitourinary: Negative for dysuria, frequency and hematuria.  Musculoskeletal: Negative for gait problem.       Right leg pain. Abnormal movements at night for right leg only.  Skin: Negative for color change, pallor and rash.  Neurological: Negative for dizziness, syncope, light-headedness and headaches.  Hematological: Does not bruise/bleed easily.  Psychiatric/Behavioral: Negative for behavioral problems and confusion. The patient is nervous/anxious.       Allergies  Latex; Zorvolex; Ambien; Cymbalta; Neurontin; and Sulfa antibiotics  Home Medications   Prior to Admission medications   Medication Sig Start Date End Date Taking? Authorizing Provider  Diclofenac (ZORVOLEX PO) Take 1 capsule by  mouth daily.    Historical Provider, MD  doxepin (SINEQUAN) 25 MG capsule Take 75 mg by mouth at bedtime.    Historical Provider, MD  HYDROcodone-acetaminophen (NORCO/VICODIN) 5-325 MG per tablet Take 1 tablet by mouth every 6 (six) hours as needed. 02/08/14   Shon Batonourtney F Horton, MD  INDOMETHACIN PO Take 1 capsule by mouth daily.    Historical Provider, MD  levothyroxine (SYNTHROID, LEVOTHROID) 50 MCG tablet Take 50 mcg by mouth at bedtime.     Historical Provider, MD  Multiple Vitamins-Minerals (MULTIVITAMIN WITH MINERALS) tablet Take 1 tablet by mouth at bedtime.     Historical Provider, MD  omeprazole (PRILOSEC) 40 MG capsule Take 40 mg by mouth every morning.     Historical Provider, MD   tiZANidine (ZANAFLEX) 4 MG tablet Take 8 mg by mouth at bedtime.     Historical Provider, MD  triamterene-hydrochlorothiazide (MAXZIDE-25) 37.5-25 MG per tablet Take 1 tablet by mouth every morning.     Historical Provider, MD   BP 156/108  Pulse 90  Temp(Src) 97.9 F (36.6 C) (Oral)  Resp 20  Ht 5\' 7"  (1.702 m)  Wt 235 lb (106.595 kg)  BMI 36.80 kg/m2  SpO2 100%  LMP 03/21/2014 Physical Exam  Constitutional: She is oriented to person, place, and time. She appears well-developed and well-nourished. No distress.  HENT:  Head: Normocephalic.  Eyes: Conjunctivae are normal. Pupils are equal, round, and reactive to light. No scleral icterus.  Neck: Normal range of motion. Neck supple. No thyromegaly present.  Cardiovascular: Normal rate and regular rhythm.  Exam reveals no gallop and no friction rub.   No murmur heard. Pulmonary/Chest: Effort normal and breath sounds normal. No respiratory distress. She has no wheezes. She has no rales.   nontender across the chest wall. Normal breath sounds without wheezing rales rhonchi  Abdominal: Soft. Bowel sounds are normal. She exhibits no distension. There is no tenderness. There is no rebound.  Musculoskeletal: Normal range of motion.  Neurological: She is alert and oriented to person, place, and time.  No foot drop. Symmetric reflexes.  Skin: Skin is warm and dry. No rash noted.  Psychiatric: She has a normal mood and affect. Her behavior is normal.    ED Course  Procedures (including critical care time) Labs Review Labs Reviewed  TROPONIN I    Imaging Review No results found.   EKG Interpretation None      MDM   Final diagnoses:  Chest pain  Anxiety  Radiculopathy    EKG normal. Normal troponin. Had an extensive evaluation including negative CT angiogram within the last 7 days. They have been constant for more than 5 days. Take 1 troponin is adequate to rule out ACS. Ultracet and seem to be anxiety related and related to  her radicular pain. Offered her a prescription for a starter dose, low-dose Lyrica to take morning. Valium at night as I think a lot of her symptoms are due to her insomnia and anxiety. Followup with her physician.    Rolland PorterMark Stacyann Mcconaughy, MD 03/23/14 2011

## 2014-06-18 ENCOUNTER — Emergency Department (HOSPITAL_COMMUNITY)
Admission: EM | Admit: 2014-06-18 | Discharge: 2014-06-18 | Disposition: A | Discharge status: Home / Self Care | Payer: Medicaid Other | Attending: Emergency Medicine | Admitting: Emergency Medicine

## 2014-06-18 ENCOUNTER — Encounter (HOSPITAL_COMMUNITY): Payer: Self-pay | Admitting: Emergency Medicine

## 2014-06-18 DIAGNOSIS — Y929 Unspecified place or not applicable: Secondary | ICD-10-CM | POA: Diagnosis not present

## 2014-06-18 DIAGNOSIS — E079 Disorder of thyroid, unspecified: Secondary | ICD-10-CM | POA: Diagnosis not present

## 2014-06-18 DIAGNOSIS — X500XXA Overexertion from strenuous movement or load, initial encounter: Secondary | ICD-10-CM | POA: Diagnosis not present

## 2014-06-18 DIAGNOSIS — S139XXA Sprain of joints and ligaments of unspecified parts of neck, initial encounter: Secondary | ICD-10-CM | POA: Diagnosis not present

## 2014-06-18 DIAGNOSIS — I10 Essential (primary) hypertension: Secondary | ICD-10-CM | POA: Insufficient documentation

## 2014-06-18 DIAGNOSIS — Z79899 Other long term (current) drug therapy: Secondary | ICD-10-CM | POA: Insufficient documentation

## 2014-06-18 DIAGNOSIS — Z791 Long term (current) use of non-steroidal anti-inflammatories (NSAID): Secondary | ICD-10-CM | POA: Diagnosis not present

## 2014-06-18 DIAGNOSIS — Z91040 Latex allergy status: Secondary | ICD-10-CM | POA: Insufficient documentation

## 2014-06-18 DIAGNOSIS — Z9889 Other specified postprocedural states: Secondary | ICD-10-CM | POA: Diagnosis not present

## 2014-06-18 DIAGNOSIS — M129 Arthropathy, unspecified: Secondary | ICD-10-CM | POA: Diagnosis not present

## 2014-06-18 DIAGNOSIS — F17200 Nicotine dependence, unspecified, uncomplicated: Secondary | ICD-10-CM | POA: Insufficient documentation

## 2014-06-18 DIAGNOSIS — S199XXA Unspecified injury of neck, initial encounter: Secondary | ICD-10-CM

## 2014-06-18 DIAGNOSIS — S161XXA Strain of muscle, fascia and tendon at neck level, initial encounter: Secondary | ICD-10-CM

## 2014-06-18 DIAGNOSIS — S0993XA Unspecified injury of face, initial encounter: Secondary | ICD-10-CM | POA: Insufficient documentation

## 2014-06-18 DIAGNOSIS — Y9389 Activity, other specified: Secondary | ICD-10-CM | POA: Diagnosis not present

## 2014-06-18 MED ORDER — HYDROCODONE-ACETAMINOPHEN 5-325 MG PO TABS
1.0000 | ORAL_TABLET | Freq: Once | ORAL | Status: AC
Start: 2014-06-18 — End: 2014-06-18
  Administered 2014-06-18: 1 via ORAL
  Filled 2014-06-18: qty 1

## 2014-06-18 MED ORDER — HYDROCODONE-ACETAMINOPHEN 5-325 MG PO TABS: 1.0000 | ORAL_TABLET | ORAL | Status: DC | PRN

## 2014-06-18 MED ORDER — CYCLOBENZAPRINE HCL 10 MG PO TABS
5.0000 mg | ORAL_TABLET | Freq: Once | ORAL | Status: AC
  Administered 2014-06-18: 5 mg via ORAL
  Filled 2014-06-18: qty 1

## 2014-06-18 MED ORDER — CYCLOBENZAPRINE HCL 10 MG PO TABS: 10.0000 mg | ORAL_TABLET | Freq: Two times a day (BID) | ORAL | Status: DC | PRN

## 2014-06-18 NOTE — ED Notes (Signed)
Pt states she has HX of pulled muscles and she was moving a box last night and felt pain between her shoulders into her neck. Pt states neck to stiff to turn in either direction.

## 2014-06-18 NOTE — Discharge Instructions (Signed)
Cryotherapy °Cryotherapy means treatment with cold. Ice or gel packs can be used to reduce both pain and swelling. Ice is the most helpful within the first 24 to 48 hours after an injury or flare-up from overusing a muscle or joint. Sprains, strains, spasms, burning pain, shooting pain, and aches can all be eased with ice. Ice can also be used when recovering from surgery. Ice is effective, has very few side effects, and is safe for most people to use. °PRECAUTIONS  °Ice is not a safe treatment option for people with: °· Raynaud phenomenon. This is a condition affecting small blood vessels in the extremities. Exposure to cold may cause your problems to return. °· Cold hypersensitivity. There are many forms of cold hypersensitivity, including: °¨ Cold urticaria. Red, itchy hives appear on the skin when the tissues begin to warm after being iced. °¨ Cold erythema. This is a red, itchy rash caused by exposure to cold. °¨ Cold hemoglobinuria. Red blood cells break down when the tissues begin to warm after being iced. The hemoglobin that carry oxygen are passed into the urine because they cannot combine with blood proteins fast enough. °· Numbness or altered sensitivity in the area being iced. °If you have any of the following conditions, do not use ice until you have discussed cryotherapy with your caregiver: °· Heart conditions, such as arrhythmia, angina, or chronic heart disease. °· High blood pressure. °· Healing wounds or open skin in the area being iced. °· Current infections. °· Rheumatoid arthritis. °· Poor circulation. °· Diabetes. °Ice slows the blood flow in the region it is applied. This is beneficial when trying to stop inflamed tissues from spreading irritating chemicals to surrounding tissues. However, if you expose your skin to cold temperatures for too long or without the proper protection, you can damage your skin or nerves. Watch for signs of skin damage due to cold. °HOME CARE INSTRUCTIONS °Follow  these tips to use ice and cold packs safely. °· Place a dry or damp towel between the ice and skin. A damp towel will cool the skin more quickly, so you may need to shorten the time that the ice is used. °· For a more rapid response, add gentle compression to the ice. °· Ice for no more than 10 to 20 minutes at a time. The bonier the area you are icing, the less time it will take to get the benefits of ice. °· Check your skin after 5 minutes to make sure there are no signs of a poor response to cold or skin damage. °· Rest 20 minutes or more between uses. °· Once your skin is numb, you can end your treatment. You can test numbness by very lightly touching your skin. The touch should be so light that you do not see the skin dimple from the pressure of your fingertip. When using ice, most people will feel these normal sensations in this order: cold, burning, aching, and numbness. °· Do not use ice on someone who cannot communicate their responses to pain, such as small children or people with dementia. °HOW TO MAKE AN ICE PACK °Ice packs are the most common way to use ice therapy. Other methods include ice massage, ice baths, and cryosprays. Muscle creams that cause a cold, tingly feeling do not offer the same benefits that ice offers and should not be used as a substitute unless recommended by your caregiver. °To make an ice pack, do one of the following: °· Place crushed ice or a   bag of frozen vegetables in a sealable plastic bag. Squeeze out the excess air. Place this bag inside another plastic bag. Slide the bag into a pillowcase or place a damp towel between your skin and the bag.  Mix 3 parts water with 1 part rubbing alcohol. Freeze the mixture in a sealable plastic bag. When you remove the mixture from the freezer, it will be slushy. Squeeze out the excess air. Place this bag inside another plastic bag. Slide the bag into a pillowcase or place a damp towel between your skin and the bag. SEEK MEDICAL CARE  IF:  You develop white spots on your skin. This may give the skin a blotchy (mottled) appearance.  Your skin turns blue or pale.  Your skin becomes waxy or hard.  Your swelling gets worse. MAKE SURE YOU:   Understand these instructions.  Will watch your condition.  Will get help right away if you are not doing well or get worse. Document Released: 07/03/2011 Document Revised: 03/23/2014 Document Reviewed: 07/03/2011 [facility]itaExitCare Patient Information 2015 [place]Care, [place]. This information is not intended to replace advice given to you by your health care provider. Make sure you discuss any questions you have with your health care provider.  Cervical Sprain A cervical sprain is an injury in the neck in which the strong, fibrous tissues (ligaments) that connect your neck bones stretch or tear. Cervical sprains can range from mild to severe. Severe cervical sprains can cause the neck vertebrae to be unstable. This can lead to damage of the spinal cord and can result in serious nervous system problems. The amount of time it takes for a cervical sprain to get better depends on the cause and extent of the injury. Most cervical sprains heal in 1 to 3 weeks. CAUSES  Severe cervical sprains may be caused by:   Contact sport injuries (such as from football, rugby, wrestling, hockey, auto racing, gymnastics, diving, martial arts, or boxing).   Motor vehicle collisions.   Whiplash injuries. This is an injury from a sudden forward and backward whipping movement of the head and neck.  Falls.  Mild cervical sprains may be caused by:   Being in an awkward position, such as while cradling a telephone between your ear and shoulder.   Sitting in a chair that does not offer proper support.   Working at a poorly Marketing executivedesigned computer station.   Looking up or down for long periods of time.  SYMPTOMS   Pain, soreness, stiffness, or a burning sensation in the front, back, or sides of the neck. This  discomfort may develop immediately after the injury or slowly, 24 hours or more after the injury.   Pain or tenderness directly in the middle of the back of the neck.   Shoulder or upper back pain.   Limited ability to move the neck.   Headache.   Dizziness.   Weakness, numbness, or tingling in the hands or arms.   Muscle spasms.   Difficulty swallowing or chewing.   Tenderness and swelling of the neck.  DIAGNOSIS  Most of the time your health care provider can diagnose a cervical sprain by taking your history and doing a physical exam. Your health care provider will ask about previous neck injuries and any known neck problems, such as arthritis in the neck. X-rays may be taken to find out if there are any other problems, such as with the bones of the neck. Other tests, such as a CT scan or MRI, may also  be needed.  TREATMENT  Treatment depends on the severity of the cervical sprain. Mild sprains can be treated with rest, keeping the neck in place (immobilization), and pain medicines. Severe cervical sprains are immediately immobilized. Further treatment is done to help with pain, muscle spasms, and other symptoms and may include:  Medicines, such as pain relievers, numbing medicines, or muscle relaxants.   Physical therapy. This may involve stretching exercises, strengthening exercises, and posture training. Exercises and improved posture can help stabilize the neck, strengthen muscles, and help stop symptoms from returning.  HOME CARE INSTRUCTIONS   Put ice on the injured area.   Put ice in a plastic bag.   Place a towel between your skin and the bag.   Leave the ice on for 15-20 minutes, 3-4 times a day.   If your injury was severe, you may have been given a cervical collar to wear. A cervical collar is a two-piece collar designed to keep your neck from moving while it heals.  Do not remove the collar unless instructed by your health care provider.  If you  have long hair, keep it outside of the collar.  Ask your health care provider before making any adjustments to your collar. Minor adjustments may be required over time to improve comfort and reduce pressure on your chin or on the back of your head.  Ifyou are allowed to remove the collar for cleaning or bathing, follow your health care provider's instructions on how to do so safely.  Keep your collar clean by wiping it with mild soap and water and drying it completely. If the collar you have been given includes removable pads, remove them every 1-2 days and hand wash them with soap and water. Allow them to air dry. They should be completely dry before you wear them in the collar.  If you are allowed to remove the collar for cleaning and bathing, wash and dry the skin of your neck. Check your skin for irritation or sores. If you see any, tell your health care provider.  Do not drive while wearing the collar.   Only take over-the-counter or prescription medicines for pain, discomfort, or fever as directed by your health care provider.   Keep all follow-up appointments as directed by your health care provider.   Keep all physical therapy appointments as directed by your health care provider.   Make any needed adjustments to your workstation to promote good posture.   Avoid positions and activities that make your symptoms worse.   Warm up and stretch before being active to help prevent problems.  SEEK MEDICAL CARE IF:   Your pain is not controlled with medicine.   You are unable to decrease your pain medicine over time as planned.   Your activity level is not improving as expected.  SEEK IMMEDIATE MEDICAL CARE IF:   You develop any bleeding.  You develop stomach upset.  You have signs of an allergic reaction to your medicine.   Your symptoms get worse.   You develop new, unexplained symptoms.   You have numbness, tingling, weakness, or paralysis in any part of your  body.  MAKE SURE YOU:   Understand these instructions.  Will watch your condition.  Will get help right away if you are not doing well or get worse. Document Released: 09/03/2007 Document Revised: 11/11/2013 Document Reviewed: 05/14/2013 [facility]Exit[facility] Patient Information 2015 [place]ExitCare, [place]LLC. This information is not intended to replace advice given to you by your health care provider. Make  sure you discuss any questions you have with your health care provider.

## 2014-06-18 NOTE — ED Provider Notes (Signed)
Medical screening examination/treatment/procedure(s) were performed by non-physician practitioner and as supervising physician I was immediately available for consultation/collaboration.   EKG Interpretation None       Schyler Butikofer, MD 06/18/14 2343 

## 2014-06-18 NOTE — ED Provider Notes (Signed)
CSN: [id number]     Arrival date & time 06/18/14  1652 History   First MD Initiated Contact with Patient 06/18/14 1707     Chief Complaint  Patient presents with  . Neck Pain     (Consider location/radiation/quality/duration/timing/severity/associated sxs/prior Treatment) Patient is a 38 y.o. female presenting with neck pain. The history is provided by the patient. No language interpreter was used.  Neck Pain Quality:  Aching and cramping Associated symptoms: no fever and no numbness   Associated symptoms comment:  Bilateral neck pain worsening over time after heavy lifting a couple of days ago. No upper extremity weakness or numbness.    Past Medical History  Diagnosis Date  . Arthritis   . Hyperlipidemia   . Hypertension   . Substance abuse   . Thyroid disease    Past Surgical History  Procedure Laterality Date  . Spine surgery  05/15/12  . Cesarean section  05/22/07  . Cholecystectomy  2009  . Knee surgery  07/2011    left  . Back surgery     Family History  Problem Relation Age of Onset  . Cancer Mother     breast  . Cancer Maternal Aunt     skin cancer on face  . Cancer Paternal Uncle     colon  . Cancer Maternal Grandfather     lung  . Cancer Paternal Grandfather     colon  . Cancer Cousin     colon   History  Substance Use Topics  . Smoking status: Current Every Day Smoker -- 0.50 packs/day    Types: Cigarettes  . Smokeless tobacco: Not on file  . Alcohol Use: No   OB History   Grav Para Term Preterm Abortions TAB SAB Ect Mult Living                 Review of Systems  Constitutional: Negative for fever and chills.  Cardiovascular: Negative.   Musculoskeletal: Positive for neck pain.       See HPI.  Skin: Negative.   Neurological: Negative.  Negative for numbness.      Allergies  Latex; Zorvolex; Ambien; Cymbalta; Neurontin; Other; and Sulfa antibiotics  Home Medications   Prior to Admission medications   Medication Sig Start Date End  Date Taking? Authorizing Provider  diazepam (VALIUM) 5 MG tablet Take 1 tablet (5 mg total) by mouth every 8 (eight) hours as needed for anxiety (muscle spasm). 03/23/14   Rolland Porter, MD  doxepin (SINEQUAN) 25 MG capsule Take 100 mg by mouth at bedtime.     Historical Provider, MD  ibuprofen (ADVIL,MOTRIN) 800 MG tablet Take 800 mg by mouth every 8 (eight) hours as needed for mild pain or moderate pain.    Historical Provider, MD  levothyroxine (SYNTHROID, LEVOTHROID) 50 MCG tablet Take 50 mcg by mouth at bedtime.     Historical Provider, MD  Multiple Vitamins-Minerals (MULTIVITAMIN WITH MINERALS) tablet Take 1 tablet by mouth at bedtime.     Historical Provider, MD  omeprazole (PRILOSEC) 40 MG capsule Take 40 mg by mouth every morning.     Historical Provider, MD  pregabalin (LYRICA) 50 MG capsule Take 1 capsule (50 mg total) by mouth daily. 03/23/14   Rolland Porter, MD  tiZANidine (ZANAFLEX) 4 MG tablet Take 8 mg by mouth at bedtime.     Historical Provider, MD  triamterene-hydrochlorothiazide (MAXZIDE-25) 37.5-25 MG per tablet Take 1 tablet by mouth every morning.     Historical Provider, MD  BP 140/64  Pulse 95  Temp(Src) 98.7 F (37.1 C) (Oral)  Resp 20  Ht 5\' 7"  (1.702 m)  Wt 240 lb (108.863 kg)  BMI 37.58 kg/m2  SpO2 100%  LMP 06/16/2014 Physical Exam  Constitutional: She is oriented to person, place, and time. She appears well-developed and well-nourished.  Neck: Normal range of motion.  Pulmonary/Chest: Effort normal.  Musculoskeletal:  Bilateral paracervical tenderness. No significant swelling. No palpable spasm. FROM upper extremities.  Neurological: She is alert and oriented to person, place, and time.  No neurologic deficits of upper extremities. Normal sensation, and grip.  Skin: Skin is warm and dry.    ED Course  Procedures (including critical care time) Labs Review Labs Reviewed - No data to display  Imaging Review No results found.   EKG Interpretation None       MDM   Final diagnoses:  None    1. Cervical sprain  No neurologic deficits on exam. Suspect muscle strain injury from lifting.    Arnoldo HookerShari A Niya Behler, PA-C 06/18/14 1740

## 2014-06-18 NOTE — ED Notes (Signed)
Neck pain, increased pain with movement.

## 2019-09-30 ENCOUNTER — Other Ambulatory Visit: Payer: Self-pay

## 2019-09-30 DIAGNOSIS — Z20822 Contact with and (suspected) exposure to covid-19: Secondary | ICD-10-CM

## 2019-10-01 ENCOUNTER — Ambulatory Visit: Payer: Self-pay | Admitting: Urology

## 2019-10-03 ENCOUNTER — Telehealth: Payer: Self-pay | Admitting: Internal Medicine

## 2019-10-03 LAB — NOVEL CORONAVIRUS, NAA: SARS-CoV-2, NAA: NOT DETECTED

## 2019-10-03 NOTE — Telephone Encounter (Signed)
Patient informed of negative covid result.  °

## 2019-11-13 ENCOUNTER — Other Ambulatory Visit: Payer: Self-pay

## 2019-11-13 ENCOUNTER — Ambulatory Visit: Payer: Medicaid Other | Attending: Internal Medicine

## 2019-11-13 DIAGNOSIS — Z20822 Contact with and (suspected) exposure to covid-19: Secondary | ICD-10-CM

## 2019-11-14 LAB — NOVEL CORONAVIRUS, NAA: SARS-CoV-2, NAA: NOT DETECTED

## 2019-11-17 ENCOUNTER — Telehealth: Payer: Self-pay | Admitting: Internal Medicine

## 2019-11-17 NOTE — Telephone Encounter (Signed)
Patient is calling receive her negative COVID test results. Patient expressed understanding.

## 2020-02-03 DIAGNOSIS — Z23 Encounter for immunization: Secondary | ICD-10-CM | POA: Diagnosis not present

## 2020-02-04 ENCOUNTER — Ambulatory Visit: Payer: Medicaid Other | Admitting: Cardiology

## 2020-03-02 DIAGNOSIS — Z23 Encounter for immunization: Secondary | ICD-10-CM | POA: Diagnosis not present

## 2020-09-02 DIAGNOSIS — F411 Generalized anxiety disorder: Secondary | ICD-10-CM | POA: Diagnosis not present

## 2020-09-02 DIAGNOSIS — F331 Major depressive disorder, recurrent, moderate: Secondary | ICD-10-CM | POA: Diagnosis not present

## 2020-09-02 DIAGNOSIS — F4310 Post-traumatic stress disorder, unspecified: Secondary | ICD-10-CM | POA: Diagnosis not present

## 2020-09-08 DIAGNOSIS — E038 Other specified hypothyroidism: Secondary | ICD-10-CM | POA: Diagnosis not present

## 2020-09-08 DIAGNOSIS — I10 Essential (primary) hypertension: Secondary | ICD-10-CM | POA: Diagnosis not present

## 2020-09-08 DIAGNOSIS — Z6841 Body Mass Index (BMI) 40.0 and over, adult: Secondary | ICD-10-CM | POA: Diagnosis not present

## 2020-09-08 DIAGNOSIS — M545 Low back pain, unspecified: Secondary | ICD-10-CM | POA: Diagnosis not present

## 2020-09-08 DIAGNOSIS — K219 Gastro-esophageal reflux disease without esophagitis: Secondary | ICD-10-CM | POA: Diagnosis not present

## 2020-09-08 DIAGNOSIS — J4520 Mild intermittent asthma, uncomplicated: Secondary | ICD-10-CM | POA: Diagnosis not present

## 2020-09-21 DIAGNOSIS — R079 Chest pain, unspecified: Secondary | ICD-10-CM | POA: Diagnosis not present

## 2020-09-21 DIAGNOSIS — R0789 Other chest pain: Secondary | ICD-10-CM | POA: Diagnosis not present

## 2020-09-23 DIAGNOSIS — Z23 Encounter for immunization: Secondary | ICD-10-CM | POA: Diagnosis not present

## 2020-09-24 DIAGNOSIS — M545 Low back pain, unspecified: Secondary | ICD-10-CM | POA: Diagnosis not present

## 2020-09-24 DIAGNOSIS — M79672 Pain in left foot: Secondary | ICD-10-CM | POA: Diagnosis not present

## 2020-09-24 DIAGNOSIS — E038 Other specified hypothyroidism: Secondary | ICD-10-CM | POA: Diagnosis not present

## 2020-09-24 DIAGNOSIS — K219 Gastro-esophageal reflux disease without esophagitis: Secondary | ICD-10-CM | POA: Diagnosis not present

## 2020-09-24 DIAGNOSIS — E559 Vitamin D deficiency, unspecified: Secondary | ICD-10-CM | POA: Diagnosis not present

## 2020-09-24 DIAGNOSIS — S99922A Unspecified injury of left foot, initial encounter: Secondary | ICD-10-CM | POA: Diagnosis not present

## 2020-09-24 DIAGNOSIS — I10 Essential (primary) hypertension: Secondary | ICD-10-CM | POA: Diagnosis not present

## 2020-09-30 DIAGNOSIS — F331 Major depressive disorder, recurrent, moderate: Secondary | ICD-10-CM | POA: Diagnosis not present

## 2020-09-30 DIAGNOSIS — F5081 Binge eating disorder: Secondary | ICD-10-CM | POA: Diagnosis not present

## 2020-09-30 DIAGNOSIS — F411 Generalized anxiety disorder: Secondary | ICD-10-CM | POA: Diagnosis not present

## 2020-09-30 DIAGNOSIS — F4310 Post-traumatic stress disorder, unspecified: Secondary | ICD-10-CM | POA: Diagnosis not present

## 2020-10-28 DIAGNOSIS — F5081 Binge eating disorder: Secondary | ICD-10-CM | POA: Diagnosis not present

## 2020-10-28 DIAGNOSIS — F411 Generalized anxiety disorder: Secondary | ICD-10-CM | POA: Diagnosis not present

## 2020-10-28 DIAGNOSIS — F4310 Post-traumatic stress disorder, unspecified: Secondary | ICD-10-CM | POA: Diagnosis not present

## 2020-10-28 DIAGNOSIS — F331 Major depressive disorder, recurrent, moderate: Secondary | ICD-10-CM | POA: Diagnosis not present

## 2020-12-02 DIAGNOSIS — N938 Other specified abnormal uterine and vaginal bleeding: Secondary | ICD-10-CM | POA: Diagnosis not present

## 2020-12-16 DIAGNOSIS — Z6839 Body mass index (BMI) 39.0-39.9, adult: Secondary | ICD-10-CM | POA: Diagnosis not present

## 2020-12-16 DIAGNOSIS — Z0000 Encounter for general adult medical examination without abnormal findings: Secondary | ICD-10-CM | POA: Diagnosis not present

## 2020-12-23 DIAGNOSIS — F4310 Post-traumatic stress disorder, unspecified: Secondary | ICD-10-CM | POA: Diagnosis not present

## 2020-12-23 DIAGNOSIS — F5081 Binge eating disorder: Secondary | ICD-10-CM | POA: Diagnosis not present

## 2020-12-23 DIAGNOSIS — F331 Major depressive disorder, recurrent, moderate: Secondary | ICD-10-CM | POA: Diagnosis not present

## 2020-12-23 DIAGNOSIS — F411 Generalized anxiety disorder: Secondary | ICD-10-CM | POA: Diagnosis not present

## 2021-01-13 DIAGNOSIS — F5081 Binge eating disorder: Secondary | ICD-10-CM | POA: Diagnosis not present

## 2021-01-13 DIAGNOSIS — F331 Major depressive disorder, recurrent, moderate: Secondary | ICD-10-CM | POA: Diagnosis not present

## 2021-01-13 DIAGNOSIS — F4310 Post-traumatic stress disorder, unspecified: Secondary | ICD-10-CM | POA: Diagnosis not present

## 2021-01-13 DIAGNOSIS — F411 Generalized anxiety disorder: Secondary | ICD-10-CM | POA: Diagnosis not present

## 2021-02-15 DIAGNOSIS — I209 Angina pectoris, unspecified: Secondary | ICD-10-CM | POA: Diagnosis not present

## 2021-02-15 DIAGNOSIS — Z6841 Body Mass Index (BMI) 40.0 and over, adult: Secondary | ICD-10-CM | POA: Diagnosis not present

## 2021-02-15 DIAGNOSIS — R778 Other specified abnormalities of plasma proteins: Secondary | ICD-10-CM | POA: Diagnosis not present

## 2021-02-17 DIAGNOSIS — F5081 Binge eating disorder: Secondary | ICD-10-CM | POA: Diagnosis not present

## 2021-02-17 DIAGNOSIS — F411 Generalized anxiety disorder: Secondary | ICD-10-CM | POA: Diagnosis not present

## 2021-02-17 DIAGNOSIS — F4310 Post-traumatic stress disorder, unspecified: Secondary | ICD-10-CM | POA: Diagnosis not present

## 2021-02-17 DIAGNOSIS — F331 Major depressive disorder, recurrent, moderate: Secondary | ICD-10-CM | POA: Diagnosis not present

## 2021-02-21 DIAGNOSIS — R0602 Shortness of breath: Secondary | ICD-10-CM | POA: Diagnosis not present

## 2021-03-23 DIAGNOSIS — K219 Gastro-esophageal reflux disease without esophagitis: Secondary | ICD-10-CM | POA: Diagnosis not present

## 2021-03-23 DIAGNOSIS — E038 Other specified hypothyroidism: Secondary | ICD-10-CM | POA: Diagnosis not present

## 2021-03-23 DIAGNOSIS — M545 Low back pain, unspecified: Secondary | ICD-10-CM | POA: Diagnosis not present

## 2021-03-23 DIAGNOSIS — J4520 Mild intermittent asthma, uncomplicated: Secondary | ICD-10-CM | POA: Diagnosis not present

## 2021-03-23 DIAGNOSIS — Z6841 Body Mass Index (BMI) 40.0 and over, adult: Secondary | ICD-10-CM | POA: Diagnosis not present

## 2021-03-23 DIAGNOSIS — I10 Essential (primary) hypertension: Secondary | ICD-10-CM | POA: Diagnosis not present

## 2021-04-20 DIAGNOSIS — Z6841 Body Mass Index (BMI) 40.0 and over, adult: Secondary | ICD-10-CM | POA: Diagnosis not present

## 2021-04-20 DIAGNOSIS — G2401 Drug induced subacute dyskinesia: Secondary | ICD-10-CM | POA: Diagnosis not present

## 2021-04-20 DIAGNOSIS — K219 Gastro-esophageal reflux disease without esophagitis: Secondary | ICD-10-CM | POA: Diagnosis not present

## 2021-04-21 DIAGNOSIS — M797 Fibromyalgia: Secondary | ICD-10-CM | POA: Diagnosis not present

## 2021-04-21 DIAGNOSIS — M542 Cervicalgia: Secondary | ICD-10-CM | POA: Diagnosis not present

## 2021-04-21 DIAGNOSIS — G894 Chronic pain syndrome: Secondary | ICD-10-CM | POA: Diagnosis not present

## 2021-04-26 DIAGNOSIS — F411 Generalized anxiety disorder: Secondary | ICD-10-CM | POA: Diagnosis not present

## 2021-04-26 DIAGNOSIS — E039 Hypothyroidism, unspecified: Secondary | ICD-10-CM | POA: Diagnosis not present

## 2021-04-26 DIAGNOSIS — I7789 Other specified disorders of arteries and arterioles: Secondary | ICD-10-CM | POA: Diagnosis not present

## 2021-04-26 DIAGNOSIS — F331 Major depressive disorder, recurrent, moderate: Secondary | ICD-10-CM | POA: Diagnosis not present

## 2021-04-26 DIAGNOSIS — F5081 Binge eating disorder: Secondary | ICD-10-CM | POA: Diagnosis not present

## 2021-04-26 DIAGNOSIS — F4310 Post-traumatic stress disorder, unspecified: Secondary | ICD-10-CM | POA: Diagnosis not present

## 2021-04-26 DIAGNOSIS — R0602 Shortness of breath: Secondary | ICD-10-CM | POA: Diagnosis not present

## 2021-05-17 DIAGNOSIS — Z79899 Other long term (current) drug therapy: Secondary | ICD-10-CM | POA: Diagnosis not present

## 2021-05-17 DIAGNOSIS — M545 Low back pain, unspecified: Secondary | ICD-10-CM | POA: Diagnosis not present

## 2021-05-17 DIAGNOSIS — M542 Cervicalgia: Secondary | ICD-10-CM | POA: Diagnosis not present

## 2021-05-17 DIAGNOSIS — G894 Chronic pain syndrome: Secondary | ICD-10-CM | POA: Diagnosis not present

## 2021-05-17 DIAGNOSIS — Z79891 Long term (current) use of opiate analgesic: Secondary | ICD-10-CM | POA: Diagnosis not present

## 2021-05-25 DIAGNOSIS — F411 Generalized anxiety disorder: Secondary | ICD-10-CM | POA: Diagnosis not present

## 2021-05-25 DIAGNOSIS — F4310 Post-traumatic stress disorder, unspecified: Secondary | ICD-10-CM | POA: Diagnosis not present

## 2021-05-25 DIAGNOSIS — F902 Attention-deficit hyperactivity disorder, combined type: Secondary | ICD-10-CM | POA: Diagnosis not present

## 2021-05-25 DIAGNOSIS — F5081 Binge eating disorder: Secondary | ICD-10-CM | POA: Diagnosis not present

## 2021-05-25 DIAGNOSIS — F331 Major depressive disorder, recurrent, moderate: Secondary | ICD-10-CM | POA: Diagnosis not present

## 2021-06-03 DIAGNOSIS — R42 Dizziness and giddiness: Secondary | ICD-10-CM | POA: Diagnosis not present

## 2021-06-03 DIAGNOSIS — I959 Hypotension, unspecified: Secondary | ICD-10-CM | POA: Diagnosis not present

## 2021-06-08 DIAGNOSIS — M545 Low back pain, unspecified: Secondary | ICD-10-CM | POA: Diagnosis not present

## 2021-06-14 DIAGNOSIS — Z79891 Long term (current) use of opiate analgesic: Secondary | ICD-10-CM | POA: Diagnosis not present

## 2021-06-14 DIAGNOSIS — Z79899 Other long term (current) drug therapy: Secondary | ICD-10-CM | POA: Diagnosis not present

## 2021-06-27 DIAGNOSIS — F5081 Binge eating disorder: Secondary | ICD-10-CM | POA: Diagnosis not present

## 2021-06-27 DIAGNOSIS — F331 Major depressive disorder, recurrent, moderate: Secondary | ICD-10-CM | POA: Diagnosis not present

## 2021-06-27 DIAGNOSIS — F411 Generalized anxiety disorder: Secondary | ICD-10-CM | POA: Diagnosis not present

## 2021-06-27 DIAGNOSIS — F4310 Post-traumatic stress disorder, unspecified: Secondary | ICD-10-CM | POA: Diagnosis not present

## 2021-07-12 DIAGNOSIS — M542 Cervicalgia: Secondary | ICD-10-CM | POA: Diagnosis not present

## 2021-07-12 DIAGNOSIS — G894 Chronic pain syndrome: Secondary | ICD-10-CM | POA: Diagnosis not present

## 2021-07-12 DIAGNOSIS — Z79891 Long term (current) use of opiate analgesic: Secondary | ICD-10-CM | POA: Diagnosis not present

## 2021-07-12 DIAGNOSIS — M797 Fibromyalgia: Secondary | ICD-10-CM | POA: Diagnosis not present

## 2021-07-12 DIAGNOSIS — Z79899 Other long term (current) drug therapy: Secondary | ICD-10-CM | POA: Diagnosis not present

## 2021-07-29 DIAGNOSIS — F5081 Binge eating disorder: Secondary | ICD-10-CM | POA: Diagnosis not present

## 2021-07-29 DIAGNOSIS — F4310 Post-traumatic stress disorder, unspecified: Secondary | ICD-10-CM | POA: Diagnosis not present

## 2021-07-29 DIAGNOSIS — F902 Attention-deficit hyperactivity disorder, combined type: Secondary | ICD-10-CM | POA: Diagnosis not present

## 2021-07-29 DIAGNOSIS — F411 Generalized anxiety disorder: Secondary | ICD-10-CM | POA: Diagnosis not present

## 2021-07-29 DIAGNOSIS — F331 Major depressive disorder, recurrent, moderate: Secondary | ICD-10-CM | POA: Diagnosis not present

## 2021-08-09 DIAGNOSIS — Z79891 Long term (current) use of opiate analgesic: Secondary | ICD-10-CM | POA: Diagnosis not present

## 2021-08-09 DIAGNOSIS — Z79899 Other long term (current) drug therapy: Secondary | ICD-10-CM | POA: Diagnosis not present

## 2021-09-06 DIAGNOSIS — M797 Fibromyalgia: Secondary | ICD-10-CM | POA: Diagnosis not present

## 2021-09-06 DIAGNOSIS — M542 Cervicalgia: Secondary | ICD-10-CM | POA: Diagnosis not present

## 2021-09-06 DIAGNOSIS — Z79899 Other long term (current) drug therapy: Secondary | ICD-10-CM | POA: Diagnosis not present

## 2021-09-06 DIAGNOSIS — Z79891 Long term (current) use of opiate analgesic: Secondary | ICD-10-CM | POA: Diagnosis not present

## 2021-09-06 DIAGNOSIS — G894 Chronic pain syndrome: Secondary | ICD-10-CM | POA: Diagnosis not present

## 2021-09-19 DIAGNOSIS — I10 Essential (primary) hypertension: Secondary | ICD-10-CM | POA: Diagnosis not present

## 2021-09-19 DIAGNOSIS — E038 Other specified hypothyroidism: Secondary | ICD-10-CM | POA: Diagnosis not present

## 2021-09-19 DIAGNOSIS — Z6841 Body Mass Index (BMI) 40.0 and over, adult: Secondary | ICD-10-CM | POA: Diagnosis not present

## 2021-09-19 DIAGNOSIS — K219 Gastro-esophageal reflux disease without esophagitis: Secondary | ICD-10-CM | POA: Diagnosis not present

## 2021-09-19 DIAGNOSIS — J4520 Mild intermittent asthma, uncomplicated: Secondary | ICD-10-CM | POA: Diagnosis not present

## 2021-09-19 DIAGNOSIS — K458 Other specified abdominal hernia without obstruction or gangrene: Secondary | ICD-10-CM | POA: Diagnosis not present

## 2021-09-23 DIAGNOSIS — K429 Umbilical hernia without obstruction or gangrene: Secondary | ICD-10-CM | POA: Diagnosis not present

## 2021-09-23 DIAGNOSIS — K5730 Diverticulosis of large intestine without perforation or abscess without bleeding: Secondary | ICD-10-CM | POA: Diagnosis not present

## 2021-09-23 DIAGNOSIS — K458 Other specified abdominal hernia without obstruction or gangrene: Secondary | ICD-10-CM | POA: Diagnosis not present

## 2021-09-23 DIAGNOSIS — K449 Diaphragmatic hernia without obstruction or gangrene: Secondary | ICD-10-CM | POA: Diagnosis not present

## 2021-09-23 DIAGNOSIS — K760 Fatty (change of) liver, not elsewhere classified: Secondary | ICD-10-CM | POA: Diagnosis not present

## 2021-09-23 DIAGNOSIS — E279 Disorder of adrenal gland, unspecified: Secondary | ICD-10-CM | POA: Diagnosis not present

## 2021-09-23 DIAGNOSIS — R160 Hepatomegaly, not elsewhere classified: Secondary | ICD-10-CM | POA: Diagnosis not present

## 2021-10-04 DIAGNOSIS — M542 Cervicalgia: Secondary | ICD-10-CM | POA: Diagnosis not present

## 2021-10-04 DIAGNOSIS — M797 Fibromyalgia: Secondary | ICD-10-CM | POA: Diagnosis not present

## 2021-10-04 DIAGNOSIS — G894 Chronic pain syndrome: Secondary | ICD-10-CM | POA: Diagnosis not present

## 2021-10-05 ENCOUNTER — Encounter: Payer: Self-pay | Admitting: Family Medicine

## 2021-10-19 DIAGNOSIS — Z6841 Body Mass Index (BMI) 40.0 and over, adult: Secondary | ICD-10-CM | POA: Diagnosis not present

## 2021-10-19 DIAGNOSIS — M79605 Pain in left leg: Secondary | ICD-10-CM | POA: Diagnosis not present

## 2021-10-20 DIAGNOSIS — M7989 Other specified soft tissue disorders: Secondary | ICD-10-CM | POA: Diagnosis not present

## 2021-10-20 DIAGNOSIS — M79662 Pain in left lower leg: Secondary | ICD-10-CM | POA: Diagnosis not present

## 2021-10-24 ENCOUNTER — Other Ambulatory Visit: Payer: Self-pay | Admitting: *Deleted

## 2021-10-24 DIAGNOSIS — K429 Umbilical hernia without obstruction or gangrene: Secondary | ICD-10-CM

## 2021-10-25 DIAGNOSIS — F902 Attention-deficit hyperactivity disorder, combined type: Secondary | ICD-10-CM | POA: Diagnosis not present

## 2021-10-25 DIAGNOSIS — F5081 Binge eating disorder: Secondary | ICD-10-CM | POA: Diagnosis not present

## 2021-10-25 DIAGNOSIS — F4310 Post-traumatic stress disorder, unspecified: Secondary | ICD-10-CM | POA: Diagnosis not present

## 2021-10-25 DIAGNOSIS — F331 Major depressive disorder, recurrent, moderate: Secondary | ICD-10-CM | POA: Diagnosis not present

## 2021-10-25 DIAGNOSIS — F411 Generalized anxiety disorder: Secondary | ICD-10-CM | POA: Diagnosis not present

## 2021-10-27 ENCOUNTER — Encounter: Payer: Self-pay | Admitting: General Surgery

## 2021-10-27 ENCOUNTER — Ambulatory Visit: Payer: Medicaid Other | Admitting: General Surgery

## 2021-10-27 ENCOUNTER — Other Ambulatory Visit: Payer: Self-pay

## 2021-10-27 VITALS — BP 146/89 | HR 60 | Temp 98.2°F | Resp 18 | Ht 67.0 in | Wt 277.0 lb

## 2021-10-27 DIAGNOSIS — K431 Incisional hernia with gangrene: Secondary | ICD-10-CM

## 2021-10-27 NOTE — Patient Instructions (Signed)
Stop Adderall two weeks before surgery on 11/16/21.

## 2021-10-28 NOTE — Progress Notes (Signed)
Natasha Cain; [id number]; 06/20/1976   HPI Patient is a 45 year old white female who was referred to my care by Dr. Olena Leatherwood for evaluation and treatment of a ventral hernia.  Patient states she has had the hernia for several months, but it is increasing in size and causes her discomfort when it is sticking out.  Is made worse with straining.  She is status post laparoscopic cholecystectomy in the past. Past Medical History:  Diagnosis Date   Arthritis    Hyperlipidemia    Hypertension    Substance abuse (HCC)    Thyroid disease     Past Surgical History:  Procedure Laterality Date   BACK SURGERY     CESAREAN SECTION  05/22/07   CHOLECYSTECTOMY  2009   KNEE SURGERY  07/2011   left   SPINE SURGERY  05/15/12    Family History  Problem Relation Age of Onset   Cancer Mother        breast   Cancer Maternal Aunt        skin cancer on face   Cancer Paternal Uncle        colon   Cancer Maternal Grandfather        lung   Cancer Paternal Grandfather        colon   Cancer Cousin        colon    Current Outpatient Medications on File Prior to Visit  Medication Sig Dispense Refill   amphetamine-dextroamphetamine (ADDERALL) 7.5 MG tablet Take 1 tablet by mouth daily.     ARIPiprazole (ABILIFY) 5 MG tablet Take by mouth.     benazepril (LOTENSIN) 40 MG tablet Take 40 mg by mouth as needed.     buprenorphine-naloxone (SUBOXONE) 8-2 mg SUBL SL tablet Place 1 tablet under the tongue daily.     busPIRone (BUSPAR) 30 MG tablet Take by mouth.     chlorthalidone (HYGROTON) 25 MG tablet Take by mouth.     diazepam (VALIUM) 5 MG tablet Take 1 tablet (5 mg total) by mouth every 8 (eight) hours as needed for anxiety (muscle spasm). 10 tablet 0   doxepin (SINEQUAN) 25 MG capsule Take 100 mg by mouth at bedtime.      ferrous sulfate 324 MG TBEC Take 324 mg by mouth.     folic acid (FOLVITE) 1 MG tablet Take 1 tablet by mouth daily.     furosemide (LASIX) 20 MG tablet Take by mouth.     gabapentin  (NEURONTIN) 800 MG tablet      HYDROcodone-acetaminophen (NORCO/VICODIN) 5-325 MG per tablet Take 1-2 tablets by mouth every 4 (four) hours as needed. 12 tablet 0   hydrOXYzine (VISTARIL) 50 MG capsule Take 100 mg by mouth 2 (two) times daily.     ibuprofen (ADVIL,MOTRIN) 800 MG tablet Take 800 mg by mouth every 8 (eight) hours as needed for mild pain or moderate pain.     levothyroxine (SYNTHROID) 125 MCG tablet      Multiple Vitamin (MULTIVITAMIN) capsule Take 1 capsule by mouth daily.     Multiple Vitamins-Minerals (MULTIVITAMIN WITH MINERALS) tablet Take 1 tablet by mouth at bedtime.      omeprazole (PRILOSEC) 40 MG capsule Take 40 mg by mouth every morning.     rOPINIRole (REQUIP) 1 MG tablet Take by mouth.     sertraline (ZOLOFT) 100 MG tablet Take 2 a day     thiamine 100 MG tablet Take 1 tablet by mouth daily.     tiZANidine (ZANAFLEX) 4  MG tablet Take 8 mg by mouth at bedtime.     topiramate (TOPAMAX) 100 MG tablet      VYVANSE 70 MG capsule Take 70 mg by mouth every morning.     No current facility-administered medications on file prior to visit.    Allergies  Allergen Reactions   Latex Rash   Zorvolex [Diclofenac] Swelling    Stroke like symptoms including tingling in arms and tongue in addition to swelling   Ambien [Zolpidem Tartrate] Other (See Comments)    Hallucinations   Cymbalta [Duloxetine Hcl]    Neurontin [Gabapentin]    Other     darvocet   Sulfa Antibiotics Rash    Social History   Substance and Sexual Activity  Alcohol Use No    Social History   Tobacco Use  Smoking Status Every Day   Packs/day: 0.50   Types: Cigarettes  Smokeless Tobacco Not on file    Review of Systems  Constitutional:  Positive for malaise/fatigue.  HENT:  Positive for sinus pain and sore throat.   Eyes:  Positive for blurred vision, double vision and pain.  Respiratory:  Positive for cough, shortness of breath and wheezing. Negative for sputum production.    Cardiovascular: Negative.   Gastrointestinal:  Positive for heartburn.  Genitourinary:  Positive for dysuria and frequency.  Musculoskeletal:  Positive for back pain, joint pain and neck pain.  Skin: Negative.   Neurological:  Positive for dizziness and sensory change.  Endo/Heme/Allergies:  Bruises/bleeds easily.  Psychiatric/Behavioral:  The patient is nervous/anxious.    Objective   Vitals:   10/27/21 0909  BP: (!) 146/89  Pulse: 60  Resp: 18  Temp: 98.2 F (36.8 C)  SpO2: 95%    Physical Exam Vitals reviewed.  Constitutional:      Appearance: Normal appearance. She is obese. She is not ill-appearing.  HENT:     Head: Normocephalic and atraumatic.  Cardiovascular:     Rate and Rhythm: Normal rate and regular rhythm.     Heart sounds: Normal heart sounds. No murmur heard.   No friction rub. No gallop.  Pulmonary:     Effort: Pulmonary effort is normal. No respiratory distress.     Breath sounds: Normal breath sounds. No stridor. No wheezing, rhonchi or rales.  Abdominal:     General: There is no distension.     Palpations: Abdomen is soft. There is no mass.     Tenderness: There is abdominal tenderness. There is no guarding or rebound.     Hernia: A hernia is present.     Comments: Reducible incisional hernia at the umbilicus deep to a surgical scar.  Skin:    General: Skin is warm and dry.  Neurological:     Mental Status: She is alert and oriented to person, place, and time.   Primary care notes reviewed Assessment  Incisional hernia Plan  Patient is scheduled for an incisional herniorrhaphy with mesh on 11/30/2021.  The risks and benefits of the procedure including bleeding, infection, mesh use, and the possibility of recurrence of the hernia were fully explained to the patient, who gave informed consent.  Will stop Adderall 2 weeks prior to the procedure.

## 2021-10-28 NOTE — H&P (Addendum)
Natasha Cain; [id number]; 06/20/1976   HPI Patient is a 45 year old white female who was referred to my care by Dr. Olena Leatherwood for evaluation and treatment of a ventral hernia.  Patient states she has had the hernia for several months, but it is increasing in size and causes her discomfort when it is sticking out.  Is made worse with straining.  She is status post laparoscopic cholecystectomy in the past. Past Medical History:  Diagnosis Date   Arthritis    Hyperlipidemia    Hypertension    Substance abuse (HCC)    Thyroid disease     Past Surgical History:  Procedure Laterality Date   BACK SURGERY     CESAREAN SECTION  05/22/07   CHOLECYSTECTOMY  2009   KNEE SURGERY  07/2011   left   SPINE SURGERY  05/15/12    Family History  Problem Relation Age of Onset   Cancer Mother        breast   Cancer Maternal Aunt        skin cancer on face   Cancer Paternal Uncle        colon   Cancer Maternal Grandfather        lung   Cancer Paternal Grandfather        colon   Cancer Cousin        colon    Current Outpatient Medications on File Prior to Visit  Medication Sig Dispense Refill   amphetamine-dextroamphetamine (ADDERALL) 7.5 MG tablet Take 1 tablet by mouth daily.     ARIPiprazole (ABILIFY) 5 MG tablet Take by mouth.     benazepril (LOTENSIN) 40 MG tablet Take 40 mg by mouth as needed.     buprenorphine-naloxone (SUBOXONE) 8-2 mg SUBL SL tablet Place 1 tablet under the tongue daily.     busPIRone (BUSPAR) 30 MG tablet Take by mouth.     chlorthalidone (HYGROTON) 25 MG tablet Take by mouth.     diazepam (VALIUM) 5 MG tablet Take 1 tablet (5 mg total) by mouth every 8 (eight) hours as needed for anxiety (muscle spasm). 10 tablet 0   doxepin (SINEQUAN) 25 MG capsule Take 100 mg by mouth at bedtime.      ferrous sulfate 324 MG TBEC Take 324 mg by mouth.     folic acid (FOLVITE) 1 MG tablet Take 1 tablet by mouth daily.     furosemide (LASIX) 20 MG tablet Take by mouth.     gabapentin  (NEURONTIN) 800 MG tablet      HYDROcodone-acetaminophen (NORCO/VICODIN) 5-325 MG per tablet Take 1-2 tablets by mouth every 4 (four) hours as needed. 12 tablet 0   hydrOXYzine (VISTARIL) 50 MG capsule Take 100 mg by mouth 2 (two) times daily.     ibuprofen (ADVIL,MOTRIN) 800 MG tablet Take 800 mg by mouth every 8 (eight) hours as needed for mild pain or moderate pain.     levothyroxine (SYNTHROID) 125 MCG tablet      Multiple Vitamin (MULTIVITAMIN) capsule Take 1 capsule by mouth daily.     Multiple Vitamins-Minerals (MULTIVITAMIN WITH MINERALS) tablet Take 1 tablet by mouth at bedtime.      omeprazole (PRILOSEC) 40 MG capsule Take 40 mg by mouth every morning.     rOPINIRole (REQUIP) 1 MG tablet Take by mouth.     sertraline (ZOLOFT) 100 MG tablet Take 2 a day     thiamine 100 MG tablet Take 1 tablet by mouth daily.     tiZANidine (ZANAFLEX) 4  MG tablet Take 8 mg by mouth at bedtime.     topiramate (TOPAMAX) 100 MG tablet      VYVANSE 70 MG capsule Take 70 mg by mouth every morning.     No current facility-administered medications on file prior to visit.    Allergies  Allergen Reactions   Latex Rash   Zorvolex [Diclofenac] Swelling    Stroke like symptoms including tingling in arms and tongue in addition to swelling   Ambien [Zolpidem Tartrate] Other (See Comments)    Hallucinations   Cymbalta [Duloxetine Hcl]    Neurontin [Gabapentin]    Other     darvocet   Sulfa Antibiotics Rash    Social History   Substance and Sexual Activity  Alcohol Use No    Social History   Tobacco Use  Smoking Status Every Day   Packs/day: 0.50   Types: Cigarettes  Smokeless Tobacco Not on file    Review of Systems  Constitutional:  Positive for malaise/fatigue.  HENT:  Positive for sinus pain and sore throat.   Eyes:  Positive for blurred vision, double vision and pain.  Respiratory:  Positive for cough, shortness of breath and wheezing. Negative for sputum production.    Cardiovascular: Negative.   Gastrointestinal:  Positive for heartburn.  Genitourinary:  Positive for dysuria and frequency.  Musculoskeletal:  Positive for back pain, joint pain and neck pain.  Skin: Negative.   Neurological:  Positive for dizziness and sensory change.  Endo/Heme/Allergies:  Bruises/bleeds easily.  Psychiatric/Behavioral:  The patient is nervous/anxious.    Objective   Vitals:   10/27/21 0909  BP: (!) 146/89  Pulse: 60  Resp: 18  Temp: 98.2 F (36.8 C)  SpO2: 95%    Physical Exam Vitals reviewed.  Constitutional:      Appearance: Normal appearance. She is obese. She is not ill-appearing.  HENT:     Head: Normocephalic and atraumatic.  Cardiovascular:     Rate and Rhythm: Normal rate and regular rhythm.     Heart sounds: Normal heart sounds. No murmur heard.   No friction rub. No gallop.  Pulmonary:     Effort: Pulmonary effort is normal. No respiratory distress.     Breath sounds: Normal breath sounds. No stridor. No wheezing, rhonchi or rales.  Abdominal:     General: There is no distension.     Palpations: Abdomen is soft. There is no mass.     Tenderness: There is abdominal tenderness. There is no guarding or rebound.     Hernia: A hernia is present.     Comments: Reducible incisional hernia at the umbilicus deep to a surgical scar.  Skin:    General: Skin is warm and dry.  Neurological:     Mental Status: She is alert and oriented to person, place, and time.   Primary care notes reviewed Assessment  Incisional hernia Plan  Patient is scheduled for an incisional herniorrhaphy with mesh on 11/30/2021.  The risks and benefits of the procedure including bleeding, infection, mesh use, and the possibility of recurrence of the hernia were fully explained to the patient, who gave informed consent.  We will stop Adderall 2 weeks prior to the procedure.

## 2021-11-03 DIAGNOSIS — G894 Chronic pain syndrome: Secondary | ICD-10-CM | POA: Diagnosis not present

## 2021-11-03 DIAGNOSIS — M542 Cervicalgia: Secondary | ICD-10-CM | POA: Diagnosis not present

## 2021-11-03 DIAGNOSIS — M797 Fibromyalgia: Secondary | ICD-10-CM | POA: Diagnosis not present

## 2021-11-08 DIAGNOSIS — Z79899 Other long term (current) drug therapy: Secondary | ICD-10-CM | POA: Diagnosis not present

## 2021-11-08 DIAGNOSIS — Z79891 Long term (current) use of opiate analgesic: Secondary | ICD-10-CM | POA: Diagnosis not present

## 2021-11-24 NOTE — Patient Instructions (Signed)
Natasha Cain  11/24/2021     @PREFPERIOPPHARMACY @   Your procedure is scheduled on  11/30/2021.   Report to 01/28/2022 at  [phone]  A.M.   Call this number if you have problems the morning of surgery:  [phone]   Remember:  Do not eat or drink after midnight.      Use your inhalers before you come and bring your rescue inhaler with you.    Take these medicines the morning of surgery with A SIP OF WATER                    abilify, suboxone, buspar, catapress, gabapentin, levothyroxine, prilosec, zoloft, zanaflex(if needed), vyvanse.     Do not wear jewelry, make-up or nail polish.  Do not wear lotions, powders, or perfumes, or deodorant.  Do not shave 48 hours prior to surgery.  Men may shave face and neck.  Do not bring valuables to the hospital.  [facility] is not responsible for any belongings or valuables.  Contacts, dentures or bridgework may not be worn into surgery.  Leave your suitcase in the car.  After surgery it may be brought to your room.  For patients admitted to the hospital, discharge time will be determined by your treatment team.  Patients discharged the day of surgery will not be allowed to drive home and must have someone with them for 24 hours.    Special instructions:   DO NOT smoke tobacco or vape for 24 hours before your procedure.  Please read over the following fact sheets that you were given. Coughing and Deep Breathing, Surgical Site Infection Prevention, Anesthesia Post-op Instructions, and Care and Recovery After Surgery      Open Hernia Repair, Adult, Care After What can I expect after the procedure? After the procedure, it is common to have: Mild discomfort. Slight bruising. Mild swelling. Pain in the belly (abdomen). A small amount of blood from the cut from surgery (incision). Follow these instructions at home: Your doctor may give you more specific instructions. If you have problems, call your  doctor. Medicines Take over-the-counter and prescription medicines only as told by your doctor. If told, take steps to prevent problems with pooping (constipation). You may need to: Drink enough fluid to keep your pee (urine) pale yellow. Take medicines. You will be told what medicines to take. Eat foods that are high in fiber. These include beans, whole grains, and fresh fruits and vegetables. Limit foods that are high in fat and sugar. These include fried or sweet foods. Ask your doctor if you should avoid driving or using machines while you are taking your medicine. Incision care  Follow instructions from your doctor about how to take care of your incision. Make sure you: Wash your hands with soap and water for at least 20 seconds before and after you change your bandage (dressing). If you cannot use soap and water, use hand sanitizer. Change your bandage. Leave stitches or skin glue in place for at least 2 weeks. Leave tape strips alone unless you are told to take them off. You may trim the edges of the tape strips if they curl up. Check your incision every day for signs of infection. Check for: More redness, swelling, or pain. More fluid or blood. Warmth. Pus or a bad smell. Wear loose, soft clothing while your incision heals. Activity  Rest as told by your doctor. Do not lift anything that is heavier  than 10 lb (4.5 kg), or the limit that you are told. Do not play contact sports until your doctor says that this is safe. If you were given a sedative during your procedure, do not drive or use machines until your doctor says that it is safe. A sedative is a medicine that helps you relax. Return to your normal activities when your doctor says that it is safe. General instructions Do not take baths, swim, or use a hot tub. Ask your doctor about taking showers or sponge baths. Hold a pillow over your belly when you cough or sneeze. This helps with pain. Do not smoke or use any  products that contain nicotine or tobacco. If you need help quitting, ask your doctor. Keep all follow-up visits. Contact a doctor if: You have any of these signs of infection in or around your incision: More redness, swelling, or pain. More fluid or blood. Warmth. Pus. A bad smell. You have a fever or chills. You have blood in your poop (stool). You have not pooped (had a bowel movement) in 2-3 days. Medicine does not help your pain. Get help right away if: You have chest pain, or you are short of breath. You feel faint or light-headed. You have very bad pain. You vomit and your pain is worse. You have pain, swelling, or redness in a leg. These symptoms may be an emergency. Get help right away. Call your local emergency services (911 in the [place] Do not wait to see if the symptoms will go away. Do not drive yourself to the hospital. Summary After this procedure, it is common to have mild discomfort, slight bruising, and mild swelling. Follow instructions from your doctor about how to take care of your cut from surgery (incision). Check every day for signs of infection. Do not lift heavy objects or play contact sports until your doctor says it is safe. Return to your normal activities as told by your doctor. This information is not intended to replace advice given to you by your health care provider. Make sure you discuss any questions you have with your health care provider. Document Revised: 06/21/2020 Document Reviewed: 06/21/2020 Elsevier Patient Education  2022 [employer] General Anesthesia, Adult, Care After This sheet gives you information about how to care for yourself after your procedure. Your health care provider may also give you more specific instructions. If you have problems or questions, contact your health care provider. What can I expect after the procedure? After the procedure, the following side effects are common: Pain or discomfort at the IV  site. Nausea. Vomiting. Sore throat. Trouble concentrating. Feeling cold or chills. Feeling weak or tired. Sleepiness and fatigue. Soreness and body aches. These side effects can affect parts of the body that were not involved in surgery. Follow these instructions at home: For the time period you were told by your health care provider:  Rest. Do not participate in activities where you could fall or become injured. Do not drive or use machinery. Do not drink alcohol. Do not take sleeping pills or medicines that cause drowsiness. Do not make important decisions or sign legal documents. Do not take care of children on your own. Eating and drinking Follow any instructions from your health care provider about eating or drinking restrictions. When you feel hungry, start by eating small amounts of foods that are soft and easy to digest (bland), such as toast. Gradually return to your regular diet. Drink enough fluid to keep your urine pale yellow.  If you vomit, rehydrate by drinking water, juice, or clear broth. General instructions If you have sleep apnea, surgery and certain medicines can increase your risk for breathing problems. Follow instructions from your health care provider about wearing your sleep device: Anytime you are sleeping, including during daytime naps. While taking prescription pain medicines, sleeping medicines, or medicines that make you drowsy. Have a responsible adult stay with you for the time you are told. It is important to have someone help care for you until you are awake and alert. Return to your normal activities as told by your health care provider. Ask your health care provider what activities are safe for you. Take over-the-counter and prescription medicines only as told by your health care provider. If you smoke, do not smoke without supervision. Keep all follow-up visits as told by your health care provider. This is important. Contact a health care  provider if: You have nausea or vomiting that does not get better with medicine. You cannot eat or drink without vomiting. You have pain that does not get better with medicine. You are unable to pass urine. You develop a skin rash. You have a fever. You have redness around your IV site that gets worse. Get help right away if: You have difficulty breathing. You have chest pain. You have blood in your urine or stool, or you vomit blood. Summary After the procedure, it is common to have a sore throat or nausea. It is also common to feel tired. Have a responsible adult stay with you for the time you are told. It is important to have someone help care for you until you are awake and alert. When you feel hungry, start by eating small amounts of foods that are soft and easy to digest (bland), such as toast. Gradually return to your regular diet. Drink enough fluid to keep your urine pale yellow. Return to your normal activities as told by your health care provider. Ask your health care provider what activities are safe for you. This information is not intended to replace advice given to you by your health care provider. Make sure you discuss any questions you have with your health care provider. Document Revised: 07/22/2020 Document Reviewed: 02/19/2020 Elsevier Patient Education  2022 Else[employer] Inc. How to Use Chlorhexidine for Bathing Chlorhexidine gluconate (CHG) is a germ-killing (antiseptic) solution that is used to clean the skin. It can get rid of the bacteria that normally live on the skin and can keep them away for about 24 hours. To clean your skin with CHG, you may be given: A CHG solution to use in the shower or as part of a sponge bath. A prepackaged cloth that contains CHG. Cleaning your skin with CHG may help lower the risk for infection: While you are staying in the intensive care unit of the hospital. If you have a vascular access, such as a central line, to provide short-term or  long-term access to your veins. If you have a catheter to drain urine from your bladder. If you are on a ventilator. A ventilator is a machine that helps you breathe by moving air in and out of your lungs. After surgery. What are the risks? Risks of using CHG include: A skin reaction. Hearing loss, if CHG gets in your ears and you have a perforated eardrum. Eye injury, if CHG gets in your eyes and is not rinsed out. The CHG product catching fire. Make sure that you avoid smoking and flames after applying CHG to your  skin. Do not use CHG: If you have a chlorhexidine allergy or have previously reacted to chlorhexidine. On babies younger than 332 months of age. How to use CHG solution Use CHG only as told by your health care provider, and follow the instructions on the label. Use the full amount of CHG as directed. Usually, this is one bottle. During a shower Follow these steps when using CHG solution during a shower (unless your health care provider gives you different instructions): Start the shower. Use your normal soap and shampoo to wash your face and hair. Turn off the shower or move out of the shower stream. Pour the CHG onto a clean washcloth. Do not use any type of brush or rough-edged sponge. Starting at your neck, lather your body down to your toes. Make sure you follow these instructions: If you will be having surgery, pay special attention to the part of your body where you will be having surgery. Scrub this area for at least 1 minute. Do not use CHG on your head or face. If the solution gets into your ears or eyes, rinse them well with water. Avoid your genital area. Avoid any areas of skin that have broken skin, cuts, or scrapes. Scrub your back and under your arms. Make sure to wash skin folds. Let the lather sit on your skin for 1-2 minutes or as long as told by your health care provider. Thoroughly rinse your entire body in the shower. Make sure that all body creases and  crevices are rinsed well. Dry off with a clean towel. Do not put any substances on your body afterward--such as powder, lotion, or perfume--unless you are told to do so by your health care provider. Only use lotions that are recommended by the manufacturer. Put on clean clothes or pajamas. If it is the night before your surgery, sleep in clean sheets.  During a sponge bath Follow these steps when using CHG solution during a sponge bath (unless your health care provider gives you different instructions): Use your normal soap and shampoo to wash your face and hair. Pour the CHG onto a clean washcloth. Starting at your neck, lather your body down to your toes. Make sure you follow these instructions: If you will be having surgery, pay special attention to the part of your body where you will be having surgery. Scrub this area for at least 1 minute. Do not use CHG on your head or face. If the solution gets into your ears or eyes, rinse them well with water. Avoid your genital area. Avoid any areas of skin that have broken skin, cuts, or scrapes. Scrub your back and under your arms. Make sure to wash skin folds. Let the lather sit on your skin for 1-2 minutes or as long as told by your health care provider. Using a different clean, wet washcloth, thoroughly rinse your entire body. Make sure that all body creases and crevices are rinsed well. Dry off with a clean towel. Do not put any substances on your body afterward--such as powder, lotion, or perfume--unless you are told to do so by your health care provider. Only use lotions that are recommended by the manufacturer. Put on clean clothes or pajamas. If it is the night before your surgery, sleep in clean sheets. How to use CHG prepackaged cloths Only use CHG cloths as told by your health care provider, and follow the instructions on the label. Use the CHG cloth on clean, dry skin. Do not use the CHG  cloth on your head or face unless your health  care provider tells you to. When washing with the CHG cloth: Avoid your genital area. Avoid any areas of skin that have broken skin, cuts, or scrapes. Before surgery Follow these steps when using a CHG cloth to clean before surgery (unless your health care provider gives you different instructions): Using the CHG cloth, vigorously scrub the part of your body where you will be having surgery. Scrub using a back-and-forth motion for 3 minutes. The area on your body should be completely wet with CHG when you are done scrubbing. Do not rinse. Discard the cloth and let the area air-dry. Do not put any substances on the area afterward, such as powder, lotion, or perfume. Put on clean clothes or pajamas. If it is the night before your surgery, sleep in clean sheets.  For general bathing Follow these steps when using CHG cloths for general bathing (unless your health care provider gives you different instructions). Use a separate CHG cloth for each area of your body. Make sure you wash between any folds of skin and between your fingers and toes. Wash your body in the following order, switching to a new cloth after each step: The front of your neck, shoulders, and chest. Both of your arms, under your arms, and your hands. Your stomach and groin area, avoiding the genitals. Your right leg and foot. Your left leg and foot. The back of your neck, your back, and your buttocks. Do not rinse. Discard the cloth and let the area air-dry. Do not put any substances on your body afterward--such as powder, lotion, or perfume--unless you are told to do so by your health care provider. Only use lotions that are recommended by the manufacturer. Put on clean clothes or pajamas. Contact a health care provider if: Your skin gets irritated after scrubbing. You have questions about using your solution or cloth. You swallow any chlorhexidine. Call your local poison control center ([phone] in the [place] Get help  right away if: Your eyes itch badly, or they become very red or swollen. Your skin itches badly and is red or swollen. Your hearing changes. You have trouble seeing. You have swelling or tingling in your mouth or throat. You have trouble breathing. These symptoms may represent a serious problem that is an emergency. Do not wait to see if the symptoms will go away. Get medical help right away. Call your local emergency services (911 in the [place] Do not drive yourself to the hospital. Summary Chlorhexidine gluconate (CHG) is a germ-killing (antiseptic) solution that is used to clean the skin. Cleaning your skin with CHG may help to lower your risk for infection. You may be given CHG to use for bathing. It may be in a bottle or in a prepackaged cloth to use on your skin. Carefully follow your health care provider's instructions and the instructions on the product label. Do not use CHG if you have a chlorhexidine allergy. Contact your health care provider if your skin gets irritated after scrubbing. This information is not intended to replace advice given to you by your health care provider. Make sure you discuss any questions you have with your health care provider. Document Revised: 01/17/2021 Document Reviewed: 01/17/2021 Elsevier Patient Education  2022 [employer]

## 2021-11-28 ENCOUNTER — Other Ambulatory Visit (INDEPENDENT_AMBULATORY_CARE_PROVIDER_SITE_OTHER): Payer: Medicaid Other | Admitting: General Surgery

## 2021-11-28 ENCOUNTER — Other Ambulatory Visit: Payer: Self-pay

## 2021-11-28 ENCOUNTER — Encounter (HOSPITAL_COMMUNITY)
Admission: RE | Admit: 2021-11-28 | Discharge: 2021-11-28 | Disposition: A | Discharge status: Home / Self Care | Payer: Medicaid Other | Source: Ambulatory Visit | Attending: General Surgery | Admitting: General Surgery

## 2021-11-28 ENCOUNTER — Encounter (HOSPITAL_COMMUNITY): Payer: Self-pay

## 2021-11-28 VITALS — BP 124/87 | HR 87 | Temp 98.6°F | Resp 18 | Ht 67.0 in | Wt 269.0 lb

## 2021-11-28 DIAGNOSIS — Z01818 Encounter for other preprocedural examination: Secondary | ICD-10-CM | POA: Diagnosis not present

## 2021-11-28 DIAGNOSIS — D649 Anemia, unspecified: Secondary | ICD-10-CM | POA: Insufficient documentation

## 2021-11-28 DIAGNOSIS — Z09 Encounter for follow-up examination after completed treatment for conditions other than malignant neoplasm: Secondary | ICD-10-CM

## 2021-11-28 DIAGNOSIS — Z79899 Other long term (current) drug therapy: Secondary | ICD-10-CM | POA: Insufficient documentation

## 2021-11-28 HISTORY — DX: Hypothyroidism, unspecified: E03.9

## 2021-11-28 HISTORY — DX: Anxiety disorder, unspecified: F41.9

## 2021-11-28 HISTORY — DX: Chronic obstructive pulmonary disease, unspecified: J44.9

## 2021-11-28 HISTORY — DX: Other specified postprocedural states: R11.2

## 2021-11-28 HISTORY — DX: Nausea with vomiting, unspecified: Z98.890

## 2021-11-28 HISTORY — DX: Other complications of anesthesia, initial encounter: T88.59XA

## 2021-11-28 HISTORY — DX: Unspecified asthma, uncomplicated: J45.909

## 2021-11-28 HISTORY — DX: Depression, unspecified: F32.A

## 2021-11-28 LAB — CBC WITH DIFFERENTIAL/PLATELET
Abs Immature Granulocytes: 0.01 10*3/uL (ref 0.00–0.07)
Basophils Absolute: 0 10*3/uL (ref 0.0–0.1)
Basophils Relative: 0 %
Eosinophils Absolute: 0 10*3/uL (ref 0.0–0.5)
Eosinophils Relative: 1 %
HCT: 36.9 % (ref 36.0–46.0)
Hemoglobin: 12.3 g/dL (ref 12.0–15.0)
Immature Granulocytes: 0 %
Lymphocytes Relative: 35 %
Lymphs Abs: 1.7 10*3/uL (ref 0.7–4.0)
MCH: 25.1 pg — ABNORMAL LOW (ref 26.0–34.0)
MCHC: 33.3 g/dL (ref 30.0–36.0)
MCV: 75.2 fL — ABNORMAL LOW (ref 80.0–100.0)
Monocytes Absolute: 0.6 10*3/uL (ref 0.1–1.0)
Monocytes Relative: 13 %
Neutro Abs: 2.5 10*3/uL (ref 1.7–7.7)
Neutrophils Relative %: 51 %
Platelets: 307 10*3/uL (ref 150–400)
RBC: 4.91 MIL/uL (ref 3.87–5.11)
RDW: 15.9 % — ABNORMAL HIGH (ref 11.5–15.5)
WBC: 4.8 10*3/uL (ref 4.0–10.5)
nRBC: 0 % (ref 0.0–0.2)

## 2021-11-28 LAB — BASIC METABOLIC PANEL
Anion gap: 10 (ref 5–15)
BUN: 11 mg/dL (ref 6–20)
CO2: 27 mmol/L (ref 22–32)
Calcium: 8.9 mg/dL (ref 8.9–10.3)
Chloride: 99 mmol/L (ref 98–111)
Creatinine, Ser: 0.81 mg/dL (ref 0.44–1.00)
GFR, Estimated: >60 mL/min (ref >60)
Glucose, Bld: 193 mg/dL — ABNORMAL HIGH (ref 70–99)
Potassium: 2.5 mmol/L — CL (ref 3.5–5.1)
Sodium: 136 mmol/L (ref 135–145)

## 2021-11-28 LAB — HCG, SERUM, QUALITATIVE: Preg, Serum: NEGATIVE

## 2021-11-28 MED ORDER — POTASSIUM CHLORIDE CRYS ER 20 MEQ PO TBCR: 40.0000 meq | EXTENDED_RELEASE_TABLET | Freq: Two times a day (BID) | ORAL | 1 refills | Status: DC

## 2021-11-28 NOTE — Pre-Procedure Instructions (Signed)
Dr Lovell Sheehan called potassium in for patient and contacted her. Order for Istat on arrival placed per his verbal order.

## 2021-11-28 NOTE — Progress Notes (Signed)
Treatment for hypokalemia

## 2021-11-28 NOTE — Pre-Procedure Instructions (Signed)
Dr Adelina Mings about 2.5 potassium.

## 2021-11-29 DIAGNOSIS — Z79891 Long term (current) use of opiate analgesic: Secondary | ICD-10-CM | POA: Diagnosis not present

## 2021-11-29 DIAGNOSIS — Z79899 Other long term (current) drug therapy: Secondary | ICD-10-CM | POA: Diagnosis not present

## 2021-11-30 ENCOUNTER — Ambulatory Visit (HOSPITAL_COMMUNITY)
Admission: RE | Admit: 2021-11-30 | Discharge: 2021-11-30 | Disposition: A | Discharge status: Home / Self Care | Payer: Medicaid Other | Attending: General Surgery | Admitting: General Surgery

## 2021-11-30 ENCOUNTER — Ambulatory Visit (HOSPITAL_COMMUNITY): Payer: Medicaid Other | Admitting: Certified Registered Nurse Anesthetist

## 2021-11-30 ENCOUNTER — Encounter (HOSPITAL_COMMUNITY)
Admission: RE | Disposition: A | Discharge status: Home / Self Care | Payer: Self-pay | Source: Home / Self Care | Attending: General Surgery

## 2021-11-30 DIAGNOSIS — I10 Essential (primary) hypertension: Secondary | ICD-10-CM | POA: Diagnosis not present

## 2021-11-30 DIAGNOSIS — F17210 Nicotine dependence, cigarettes, uncomplicated: Secondary | ICD-10-CM | POA: Insufficient documentation

## 2021-11-30 DIAGNOSIS — F418 Other specified anxiety disorders: Secondary | ICD-10-CM | POA: Insufficient documentation

## 2021-11-30 DIAGNOSIS — E039 Hypothyroidism, unspecified: Secondary | ICD-10-CM | POA: Insufficient documentation

## 2021-11-30 DIAGNOSIS — Z9049 Acquired absence of other specified parts of digestive tract: Secondary | ICD-10-CM | POA: Insufficient documentation

## 2021-11-30 DIAGNOSIS — Z538 Procedure and treatment not carried out for other reasons: Secondary | ICD-10-CM | POA: Diagnosis not present

## 2021-11-30 DIAGNOSIS — J449 Chronic obstructive pulmonary disease, unspecified: Secondary | ICD-10-CM | POA: Diagnosis not present

## 2021-11-30 DIAGNOSIS — Z6841 Body Mass Index (BMI) 40.0 and over, adult: Secondary | ICD-10-CM | POA: Insufficient documentation

## 2021-11-30 DIAGNOSIS — R0789 Other chest pain: Secondary | ICD-10-CM | POA: Diagnosis not present

## 2021-11-30 DIAGNOSIS — K439 Ventral hernia without obstruction or gangrene: Secondary | ICD-10-CM | POA: Insufficient documentation

## 2021-11-30 LAB — POCT I-STAT, CHEM 8
BUN: 8 mg/dL (ref 6–20)
Calcium, Ion: 1.05 mmol/L — ABNORMAL LOW (ref 1.15–1.40)
Chloride: 98 mmol/L (ref 98–111)
Creatinine, Ser: 0.7 mg/dL (ref 0.44–1.00)
Glucose, Bld: 189 mg/dL — ABNORMAL HIGH (ref 70–99)
HCT: 38 % (ref 36.0–46.0)
Hemoglobin: 12.9 g/dL (ref 12.0–15.0)
Potassium: 3.2 mmol/L — ABNORMAL LOW (ref 3.5–5.1)
Sodium: 135 mmol/L (ref 135–145)
TCO2: 25 mmol/L (ref 22–32)

## 2021-11-30 SURGERY — REPAIR, HERNIA, INCISIONAL
Anesthesia: General

## 2021-11-30 MED ORDER — LACTATED RINGERS IV SOLN: INTRAVENOUS | Status: DC

## 2021-11-30 MED ORDER — FENTANYL CITRATE (PF) 250 MCG/5ML IJ SOLN
INTRAMUSCULAR | Status: AC
  Filled 2021-11-30: qty 5

## 2021-11-30 MED ORDER — CEFAZOLIN IN SODIUM CHLORIDE 3-0.9 GM/100ML-% IV SOLN
INTRAVENOUS | Status: AC
  Filled 2021-11-30: qty 100

## 2021-11-30 MED ORDER — ONDANSETRON HCL 4 MG/2ML IJ SOLN
INTRAMUSCULAR | Status: AC
  Filled 2021-11-30: qty 2

## 2021-11-30 MED ORDER — CHLORHEXIDINE GLUCONATE 0.12 % MT SOLN
15.0000 mL | Freq: Once | OROMUCOSAL | Status: AC
  Administered 2021-11-30: 15 mL via OROMUCOSAL

## 2021-11-30 MED ORDER — LIDOCAINE HCL (PF) 2 % IJ SOLN
INTRAMUSCULAR | Status: AC
  Filled 2021-11-30: qty 5

## 2021-11-30 MED ORDER — DEXAMETHASONE SODIUM PHOSPHATE 10 MG/ML IJ SOLN
INTRAMUSCULAR | Status: AC
  Filled 2021-11-30: qty 1

## 2021-11-30 MED ORDER — BUPIVACAINE LIPOSOME 1.3 % IJ SUSP
INTRAMUSCULAR | Status: AC
  Filled 2021-11-30: qty 20

## 2021-11-30 MED ORDER — ORAL CARE MOUTH RINSE: 15.0000 mL | Freq: Once | OROMUCOSAL | Status: AC

## 2021-11-30 MED ORDER — CEFAZOLIN IN SODIUM CHLORIDE 3-0.9 GM/100ML-% IV SOLN: 3.0000 g | INTRAVENOUS | Status: DC

## 2021-11-30 MED ORDER — CHLORHEXIDINE GLUCONATE CLOTH 2 % EX PADS: 6.0000 | MEDICATED_PAD | Freq: Once | CUTANEOUS | Status: DC

## 2021-11-30 MED ORDER — MIDAZOLAM HCL 2 MG/2ML IJ SOLN
INTRAMUSCULAR | Status: AC
  Filled 2021-11-30: qty 2

## 2021-11-30 MED ORDER — PROPOFOL 10 MG/ML IV BOLUS
INTRAVENOUS | Status: AC
  Filled 2021-11-30: qty 40

## 2021-11-30 MED ORDER — ROCURONIUM BROMIDE 10 MG/ML (PF) SYRINGE
PREFILLED_SYRINGE | INTRAVENOUS | Status: AC
  Filled 2021-11-30: qty 10

## 2021-11-30 NOTE — Anesthesia Preprocedure Evaluation (Signed)
Anesthesia Evaluation  Patient identified by MRN, date of birth, ID band Patient awake    Reviewed: Allergy & Precautions, H&P , NPO status , Patient's Chart, lab work & pertinent test results, reviewed documented beta blocker date and time   History of Anesthesia Complications (+) PONV and history of anesthetic complications  Airway Mallampati: II  TM Distance: >3 FB Neck ROM: full    Dental no notable dental hx.    Pulmonary asthma , COPD, Current Smoker,    Pulmonary exam normal breath sounds clear to auscultation       Cardiovascular Exercise Tolerance: Good hypertension, negative cardio ROS   Rhythm:regular Rate:Normal     Neuro/Psych PSYCHIATRIC DISORDERS Anxiety Depression negative neurological ROS     GI/Hepatic negative GI ROS, Neg liver ROS,   Endo/Other  Hypothyroidism Morbid obesity  Renal/GU negative Renal ROS  negative genitourinary   Musculoskeletal   Abdominal   Peds  Hematology negative hematology ROS (+)   Anesthesia Other Findings Patient with a long history of chest discomfort and negative cardiac workups.  Previous physician evaluations have noted significant anxiety, which is present today.  Reproductive/Obstetrics negative OB ROS                             Anesthesia Physical Anesthesia Plan  ASA: 3  Anesthesia Plan: General and General ETT   Post-op Pain Management:    Induction:   PONV Risk Score and Plan: Ondansetron  Airway Management Planned:   Additional Equipment:   Intra-op Plan:   Post-operative Plan:   Informed Consent: I have reviewed the patients History and Physical, chart, labs and discussed the procedure including the risks, benefits and alternatives for the proposed anesthesia with the patient or authorized representative who has indicated his/her understanding and acceptance.     Dental Advisory Given  Plan Discussed with:  CRNA  Anesthesia Plan Comments:         Anesthesia Quick Evaluation

## 2021-11-30 NOTE — Progress Notes (Signed)
Patient had an episode of chest pain radiating to her jaw yesterday evening and early this morning.  Has had them in the past.  It happens when she takes Lasix.  She was on supplemental KCL for potassium of 2.5, now 3.2.  In discussion with her, she would like to see her Cardiologist in [place] prior to elective surgery.  Will cancel surgery for now.

## 2021-12-01 DIAGNOSIS — F411 Generalized anxiety disorder: Secondary | ICD-10-CM | POA: Diagnosis not present

## 2021-12-01 DIAGNOSIS — F4310 Post-traumatic stress disorder, unspecified: Secondary | ICD-10-CM | POA: Diagnosis not present

## 2021-12-01 DIAGNOSIS — F5081 Binge eating disorder: Secondary | ICD-10-CM | POA: Diagnosis not present

## 2021-12-01 DIAGNOSIS — F902 Attention-deficit hyperactivity disorder, combined type: Secondary | ICD-10-CM | POA: Diagnosis not present

## 2021-12-01 DIAGNOSIS — F331 Major depressive disorder, recurrent, moderate: Secondary | ICD-10-CM | POA: Diagnosis not present

## 2021-12-07 DIAGNOSIS — R922 Inconclusive mammogram: Secondary | ICD-10-CM | POA: Diagnosis not present

## 2021-12-20 DIAGNOSIS — M79605 Pain in left leg: Secondary | ICD-10-CM | POA: Diagnosis not present

## 2021-12-20 DIAGNOSIS — M722 Plantar fascial fibromatosis: Secondary | ICD-10-CM | POA: Diagnosis not present

## 2021-12-20 DIAGNOSIS — Z6841 Body Mass Index (BMI) 40.0 and over, adult: Secondary | ICD-10-CM | POA: Diagnosis not present

## 2021-12-20 DIAGNOSIS — Z0000 Encounter for general adult medical examination without abnormal findings: Secondary | ICD-10-CM | POA: Diagnosis not present

## 2021-12-23 DIAGNOSIS — F331 Major depressive disorder, recurrent, moderate: Secondary | ICD-10-CM | POA: Diagnosis not present

## 2021-12-23 DIAGNOSIS — F411 Generalized anxiety disorder: Secondary | ICD-10-CM | POA: Diagnosis not present

## 2021-12-23 DIAGNOSIS — F4310 Post-traumatic stress disorder, unspecified: Secondary | ICD-10-CM | POA: Diagnosis not present

## 2021-12-23 DIAGNOSIS — F5081 Binge eating disorder: Secondary | ICD-10-CM | POA: Diagnosis not present

## 2021-12-23 DIAGNOSIS — F902 Attention-deficit hyperactivity disorder, combined type: Secondary | ICD-10-CM | POA: Diagnosis not present

## 2021-12-27 DIAGNOSIS — M797 Fibromyalgia: Secondary | ICD-10-CM | POA: Diagnosis not present

## 2021-12-27 DIAGNOSIS — M542 Cervicalgia: Secondary | ICD-10-CM | POA: Diagnosis not present

## 2021-12-27 DIAGNOSIS — G894 Chronic pain syndrome: Secondary | ICD-10-CM | POA: Diagnosis not present

## 2022-01-10 DIAGNOSIS — Z79899 Other long term (current) drug therapy: Secondary | ICD-10-CM | POA: Diagnosis not present

## 2022-01-10 DIAGNOSIS — Z79891 Long term (current) use of opiate analgesic: Secondary | ICD-10-CM | POA: Diagnosis not present

## 2022-01-19 DIAGNOSIS — F331 Major depressive disorder, recurrent, moderate: Secondary | ICD-10-CM | POA: Diagnosis not present

## 2022-01-19 DIAGNOSIS — F4310 Post-traumatic stress disorder, unspecified: Secondary | ICD-10-CM | POA: Diagnosis not present

## 2022-01-19 DIAGNOSIS — F411 Generalized anxiety disorder: Secondary | ICD-10-CM | POA: Diagnosis not present

## 2022-01-19 DIAGNOSIS — F902 Attention-deficit hyperactivity disorder, combined type: Secondary | ICD-10-CM | POA: Diagnosis not present

## 2022-01-19 DIAGNOSIS — F5081 Binge eating disorder: Secondary | ICD-10-CM | POA: Diagnosis not present

## 2022-02-07 DIAGNOSIS — G894 Chronic pain syndrome: Secondary | ICD-10-CM | POA: Diagnosis not present

## 2022-02-07 DIAGNOSIS — Z79891 Long term (current) use of opiate analgesic: Secondary | ICD-10-CM | POA: Diagnosis not present

## 2022-02-07 DIAGNOSIS — M797 Fibromyalgia: Secondary | ICD-10-CM | POA: Diagnosis not present

## 2022-02-07 DIAGNOSIS — M542 Cervicalgia: Secondary | ICD-10-CM | POA: Diagnosis not present

## 2022-02-07 DIAGNOSIS — Z79899 Other long term (current) drug therapy: Secondary | ICD-10-CM | POA: Diagnosis not present

## 2022-02-09 DIAGNOSIS — J301 Allergic rhinitis due to pollen: Secondary | ICD-10-CM | POA: Diagnosis not present

## 2022-02-09 DIAGNOSIS — Z6841 Body Mass Index (BMI) 40.0 and over, adult: Secondary | ICD-10-CM | POA: Diagnosis not present

## 2022-02-09 DIAGNOSIS — M722 Plantar fascial fibromatosis: Secondary | ICD-10-CM | POA: Diagnosis not present

## 2022-02-22 ENCOUNTER — Ambulatory Visit: Payer: Medicaid Other | Admitting: Podiatry

## 2022-02-22 ENCOUNTER — Ambulatory Visit (INDEPENDENT_AMBULATORY_CARE_PROVIDER_SITE_OTHER): Payer: Medicaid Other

## 2022-02-22 DIAGNOSIS — M722 Plantar fascial fibromatosis: Secondary | ICD-10-CM

## 2022-02-22 MED ORDER — MELOXICAM 15 MG PO TABS: 15.0000 mg | ORAL_TABLET | Freq: Every day | ORAL | 1 refills | Status: DC

## 2022-02-22 MED ORDER — BETAMETHASONE SOD PHOS & ACET 6 (3-3) MG/ML IJ SUSP
3.0000 mg | Freq: Once | INTRAMUSCULAR | Status: AC
  Administered 2022-02-22: 3 mg via INTRA_ARTICULAR

## 2022-02-22 NOTE — Progress Notes (Addendum)
? ?  Subjective: ?46 y.o. female presenting as a new patient for evaluation of right arch pain has been going on for several months now.  Patient suffers from pain throughout her entire body and has had back surgery.  She says that her right foot pain is very depressing and debilitating.  She denies any history of injury.  She recently completed a Medrol Dosepak.  ? ? ?Past Medical History:  ?Diagnosis Date  ? Anxiety   ? Arthritis   ? Asthma   ? Complication of anesthesia   ? patietn wakes up in panic mode  ? COPD (chronic obstructive pulmonary disease) (HCC)   ? Depression   ? Hyperlipidemia   ? Hypertension   ? Hypothyroidism   ? PONV (postoperative nausea and vomiting)   ? Substance abuse (HCC)   ? Thyroid disease   ? ? ? ?Objective: ?Physical Exam ?General: The patient is alert and oriented x3 in no acute distress. ? ?Dermatology: Skin is warm, dry and supple bilateral lower extremities. Negative for open lesions or macerations bilateral.  There is some hyperkeratotic callus tissue ? ?Vascular: Dorsalis Pedis and Posterior Tibial pulses palpable bilateral.  Capillary fill time is immediate to all digits. ? ?Neurological: Epicritic and protective threshold intact bilateral.  ? ?Musculoskeletal: Tenderness to palpation to the plantar aspect of the right arch along the plantar fascia. All other joints range of motion within normal limits bilateral. Strength 5/5 in all groups bilateral.  ? ?Radiographic exam: ?Normal osseous mineralization.  Degenerative changes noted throughout the midtarsal joint and TMT joints of the foot bilateral ? ?Assessment: ?1. Plantar fasciitis right ?2.  Symptomatic calluses bilateral heels ? ?Plan of Care:  ?1. Patient evaluated. Xrays reviewed.   ?2. Injection of 0.5cc Celestone soluspan injected into the right plantar fascia  ?3.  Patient recently completed a Medrol Dosepak 4. Rx for Meloxicam ordered for patient. ?5. Plantar fascial band(s) dispensed ?6. Instructed patient regarding  therapies and modalities at home to alleviate symptoms.  ?7.  Appointment with Pedorthist for custom molded orthotics ?8.  Urea 40% lotion dispensed at checkout  ?9.  Return to clinic in 4 weeks.   ? ? ?Felecia Shelling, DPM ?[facility] ? ?Dr. Felecia Shelling, DPM  ?  ?[address]                                        ?[place], [place] [address]                ?Office [phone]  ?Fax [phone] ? ? ? ? ?

## 2022-02-23 ENCOUNTER — Encounter: Payer: Self-pay | Admitting: General Surgery

## 2022-02-23 ENCOUNTER — Telehealth: Payer: Self-pay | Admitting: *Deleted

## 2022-02-23 ENCOUNTER — Other Ambulatory Visit: Payer: Self-pay

## 2022-02-23 ENCOUNTER — Ambulatory Visit: Payer: Medicaid Other | Admitting: General Surgery

## 2022-02-23 VITALS — BP 149/83 | HR 55 | Temp 98.0°F | Resp 18 | Ht 67.0 in | Wt 264.0 lb

## 2022-02-23 DIAGNOSIS — K432 Incisional hernia without obstruction or gangrene: Secondary | ICD-10-CM

## 2022-02-23 NOTE — H&P (Signed)
Natasha Cain; [id number]; 1975/12/31 ? ? ?HPI ?Patient is a 46 year old white female who was referred to my care by Dr. Sherrie Sport for evaluation and treatment of a ventral hernia.  Patient states she has had the hernia for several months, but it is increasing in size and causes her discomfort when it is sticking out.  Is made worse with straining.  She is status post laparoscopic cholecystectomy in the past. ?Past Medical History:  ?Diagnosis Date  ? Arthritis   ? Hyperlipidemia   ? Hypertension   ? Substance abuse ([place])   ? Thyroid disease   ? ? ?Past Surgical History:  ?Procedure Laterality Date  ? BACK SURGERY    ? CESAREAN SECTION  05/22/07  ? CHOLECYSTECTOMY  2009  ? KNEE SURGERY  07/2011  ? left  ? SPINE SURGERY  05/15/12  ? ? ?Family History  ?Problem Relation Age of Onset  ? Cancer Mother   ?     breast  ? Cancer Maternal Aunt   ?     skin cancer on face  ? Cancer Paternal Uncle   ?     colon  ? Cancer Maternal Grandfather   ?     lung  ? Cancer Paternal Grandfather   ?     colon  ? Cancer Cousin   ?     colon  ? ? ?Current Outpatient Medications on File Prior to Visit  ?Medication Sig Dispense Refill  ? amphetamine-dextroamphetamine (ADDERALL) 7.5 MG tablet Take 1 tablet by mouth daily.    ? ARIPiprazole (ABILIFY) 5 MG tablet Take by mouth.    ? benazepril (LOTENSIN) 40 MG tablet Take 40 mg by mouth as needed.    ? buprenorphine-naloxone (SUBOXONE) 8-2 mg SUBL SL tablet Place 1 tablet under the tongue daily.    ? busPIRone (BUSPAR) 30 MG tablet Take by mouth.    ? chlorthalidone (HYGROTON) 25 MG tablet Take by mouth.    ? diazepam (VALIUM) 5 MG tablet Take 1 tablet (5 mg total) by mouth every 8 (eight) hours as needed for anxiety (muscle spasm). 10 tablet 0  ? doxepin (SINEQUAN) 25 MG capsule Take 100 mg by mouth at bedtime.     ? ferrous sulfate 324 MG TBEC Take 324 mg by mouth.    ? folic acid (FOLVITE) 1 MG tablet Take 1 tablet by mouth daily.    ? furosemide (LASIX) 20 MG tablet Take by mouth.    ? gabapentin  (NEURONTIN) 800 MG tablet     ? HYDROcodone-acetaminophen (NORCO/VICODIN) 5-325 MG per tablet Take 1-2 tablets by mouth every 4 (four) hours as needed. 12 tablet 0  ? hydrOXYzine (VISTARIL) 50 MG capsule Take 100 mg by mouth 2 (two) times daily.    ? ibuprofen (ADVIL,MOTRIN) 800 MG tablet Take 800 mg by mouth every 8 (eight) hours as needed for mild pain or moderate pain.    ? levothyroxine (SYNTHROID) 125 MCG tablet     ? Multiple Vitamin (MULTIVITAMIN) capsule Take 1 capsule by mouth daily.    ? Multiple Vitamins-Minerals (MULTIVITAMIN WITH MINERALS) tablet Take 1 tablet by mouth at bedtime.     ? omeprazole (PRILOSEC) 40 MG capsule Take 40 mg by mouth every morning.    ? rOPINIRole (REQUIP) 1 MG tablet Take by mouth.    ? sertraline (ZOLOFT) 100 MG tablet Take 2 a day    ? thiamine 100 MG tablet Take 1 tablet by mouth daily.    ? tiZANidine (ZANAFLEX) 4  MG tablet Take 8 mg by mouth at bedtime.    ? topiramate (TOPAMAX) 100 MG tablet     ? VYVANSE 70 MG capsule Take 70 mg by mouth every morning.    ? ?No current facility-administered medications on file prior to visit.  ? ? ?Allergies  ?Allergen Reactions  ? Latex Rash  ? Zorvolex [Diclofenac] Swelling  ?  Stroke like symptoms including tingling in arms and tongue in addition to swelling  ? Ambien [Zolpidem Tartrate] Other (See Comments)  ?  Hallucinations  ? Cymbalta [Duloxetine Hcl]   ? Neurontin [Gabapentin]   ? Other   ?  darvocet  ? Sulfa Antibiotics Rash  ? ? ?Social History  ? ?Substance and Sexual Activity  ?Alcohol Use No  ? ? ?Social History  ? ?Tobacco Use  ?Smoking Status Every Day  ? Packs/day: 0.50  ? Types: Cigarettes  ?Smokeless Tobacco Not on file  ? ? ?Review of Systems  ?Constitutional:  Positive for malaise/fatigue.  ?HENT:  Positive for sinus pain and sore throat.   ?Eyes:  Positive for blurred vision, double vision and pain.  ?Respiratory:  Positive for cough, shortness of breath and wheezing. Negative for sputum production.    ?Cardiovascular: Negative.   ?Gastrointestinal:  Positive for heartburn.  ?Genitourinary:  Positive for dysuria and frequency.  ?Musculoskeletal:  Positive for back pain, joint pain and neck pain.  ?Skin: Negative.   ?Neurological:  Positive for dizziness and sensory change.  ?Endo/Heme/Allergies:  Bruises/bleeds easily.  ?Psychiatric/Behavioral:  The patient is nervous/anxious.   ? ?Objective  ? ?Vitals:  ? 10/27/21 0909  ?BP: (!) 146/89  ?Pulse: 60  ?Resp: 18  ?Temp: 98.2 ?F (36.8 ?C)  ?SpO2: 95%  ? ? ?Physical Exam ?Vitals reviewed.  ?Constitutional:   ?   Appearance: Normal appearance. She is obese. She is not ill-appearing.  ?HENT:  ?   Head: Normocephalic and atraumatic.  ?Cardiovascular:  ?   Rate and Rhythm: Normal rate and regular rhythm.  ?   Heart sounds: Normal heart sounds. No murmur heard. ?  No friction rub. No gallop.  ?Pulmonary:  ?   Effort: Pulmonary effort is normal. No respiratory distress.  ?   Breath sounds: Normal breath sounds. No stridor. No wheezing, rhonchi or rales.  ?Abdominal:  ?   General: There is no distension.  ?   Palpations: Abdomen is soft. There is no mass.  ?   Tenderness: There is abdominal tenderness. There is no guarding or rebound.  ?   Hernia: A hernia is present.  ?   Comments: Reducible incisional hernia at the umbilicus deep to a surgical scar.  ?Skin: ?   General: Skin is warm and dry.  ?Neurological:  ?   Mental Status: She is alert and oriented to person, place, and time.  ? ?Primary care notes reviewed ?Assessment  ?Incisional hernia ?Plan  ?Patient is scheduled for an incisional herniorrhaphy with mesh on 03/13/2022.  The risks and benefits of the procedure including bleeding, infection, mesh use, and the possibility of recurrence of the hernia were fully explained to the patient, who gave informed consent.  Her previous surgery was canceled secondary to anxiety and hypokalemia which have resolved.  She will stop her Adderall approximately 2 weeks before her  surgery. ?

## 2022-02-23 NOTE — Progress Notes (Signed)
Subjective:  ?  ? Natasha Cain  ?Patient presents back to reschedule surgery.  She feels she had an anxiety attack which was also complicated by her hypokalemia.  She has seen her primary care physician and her potassium has returned to normal.  She is not taking Lasix.  She has not had any further episodes of jaw pain and anxiety. ?Objective:  ? ? BP (!) 149/83   Pulse (!) 55   Temp 98 ?F (36.7 ?C) (Oral)   Resp 18   Ht 5\' 7"  (1.702 m)   Wt 264 lb (119.7 kg)   SpO2 97%   BMI 41.35 kg/m?  ? ?General:  alert, cooperative, and no distress  ? ?   ? ?Assessment:  ? ? Incisional hernia ,   ?  ?Plan:  ? ?Patient is scheduled for an incisional herniorrhaphy with mesh on 03/13/2022.  The risks and benefits of the procedure including bleeding, infection, mesh use, and the possibility of recurrence of the hernia were fully explained to the patient, who gave informed consent.  We will retest her potassium prior to surgery.  She continues to stay off her Lasix.  She is taking potassium supplementation. ?

## 2022-02-23 NOTE — Telephone Encounter (Signed)
Received recommendations from Dr. Arnoldo Morale to advise that patient is not required to hold Adderall prior to surgery.  ? ?Call placed to patient and left message for patient to return call.  ?

## 2022-02-27 NOTE — Telephone Encounter (Signed)
Call placed to patient and patient made aware.  ? ?Patient also reports that she is going to a new pain management specialist and her initial appointment will be 02/28/2022. ?

## 2022-03-07 DIAGNOSIS — Z79891 Long term (current) use of opiate analgesic: Secondary | ICD-10-CM | POA: Diagnosis not present

## 2022-03-07 DIAGNOSIS — G894 Chronic pain syndrome: Secondary | ICD-10-CM | POA: Diagnosis not present

## 2022-03-07 DIAGNOSIS — M542 Cervicalgia: Secondary | ICD-10-CM | POA: Diagnosis not present

## 2022-03-07 DIAGNOSIS — Z79899 Other long term (current) drug therapy: Secondary | ICD-10-CM | POA: Diagnosis not present

## 2022-03-07 DIAGNOSIS — M797 Fibromyalgia: Secondary | ICD-10-CM | POA: Diagnosis not present

## 2022-03-09 ENCOUNTER — Telehealth: Payer: Self-pay | Admitting: *Deleted

## 2022-03-09 ENCOUNTER — Encounter (HOSPITAL_COMMUNITY)
Admission: RE | Admit: 2022-03-09 | Discharge: 2022-03-09 | Disposition: A | Discharge status: Home / Self Care | Payer: Medicaid Other | Source: Ambulatory Visit | Attending: General Surgery | Admitting: General Surgery

## 2022-03-09 NOTE — Patient Instructions (Signed)
? ? ? ? ? ? ? ? Natasha Cain ? 03/09/2022  ?  ? @PREFPERIOPPHARMACY @ ? ? Your procedure is scheduled on  03/13/2022. ? ? Report to 03/15/2022 at  0600  A.M. ? ? Call this number if you have problems the morning of surgery: ? [phone] ? ? Remember: ? Do not eat or drink after midnight. ?  ? ?   Use your inhaler before you come and bring your rescue inhaler with  you. ?  ? Take these medicines the morning of surgery with A SIP OF WATER  ? ?        abilify, suboxone, buspar, clonidine, gabapentin, levothyroxine, mobic(if needed), prilosec, zanaflex(if needed), vyvanse. ?  ? ? Do not wear jewelry, make-up or nail polish. ? Do not wear lotions, powders, or perfumes, or deodorant. ? Do not shave 48 hours prior to surgery.  Men may shave face and neck. ? Do not bring valuables to the hospital. ? [facility] is not responsible for any belongings or valuables. ? ?Contacts, dentures or bridgework may not be worn into surgery.  Leave your suitcase in the car.  After surgery it may be brought to your room. ? ?For patients admitted to the hospital, discharge time will be determined by your treatment team. ? ?Patients discharged the day of surgery will not be allowed to drive home and must  have someone with them for 24 hours.  ? ? ?Special instructions:   DO NOT smoke tobacco or vape for 24 hours before your procedure. ? ?Please read over the following fact sheets that you were given. ?Coughing and Deep Breathing, Surgical Site Infection Prevention, Anesthesia Post-op Instructions, and Care and Recovery After Surgery ?  ? ? ? Open Hernia Repair, Adult, Care After ?What can I expect after the procedure? ?After the procedure, it is common to have: ?Mild discomfort. ?Slight bruising. ?Mild swelling. ?Pain in the belly (abdomen). ?A small amount of blood from the cut from surgery (incision). ?Follow these instructions at home: ?Your doctor may give you more specific instructions. If you have problems, call your  doctor. ?Medicines ?Take over-the-counter and prescription medicines only as told by your doctor. ?If told, take steps to prevent problems with pooping (constipation). You may need to: ?Drink enough fluid to keep your pee (urine) pale yellow. ?Take medicines. You will be told what medicines to take. ?Eat foods that are high in fiber. These include beans, whole grains, and fresh fruits and vegetables. ?Limit foods that are high in fat and sugar. These include fried or sweet foods. ?Ask your doctor if you should avoid driving or using machines while you are taking your medicine. ?Incision care ? ?Follow instructions from your doctor about how to take care of your incision. Make sure you: ?Wash your hands with soap and water for at least 20 seconds before and after you change your bandage (dressing). If you cannot use soap and water, use hand sanitizer. ?Change your bandage. ?Leave stitches or skin glue in place for at least 2 weeks. ?Leave tape strips alone unless you are told to take them off. You may trim the edges of the tape strips if they curl up. ?Check your incision every day for signs of infection. Check for: ?More redness, swelling, or pain. ?More fluid or blood. ?Warmth. ?Pus or a bad smell. ?Wear loose, soft clothing while your incision heals. ?Activity ? ?Rest as told by your doctor. ?Do not lift anything that is heavier than 10 lb (4.5 kg),  or the limit that you are told. ?Do not play contact sports until your doctor says that this is safe. ?If you were given a sedative during your procedure, do not drive or use machines until your doctor says that it is safe. A sedative is a medicine that helps you relax. ?Return to your normal activities when your doctor says that it is safe. ?General instructions ?Do not take baths, swim, or use a hot tub. Ask your doctor about taking showers or sponge baths. ?Hold a pillow over your belly when you cough or sneeze. This helps with pain. ?Do not smoke or use any  products that contain nicotine or tobacco. If you need help quitting, ask your doctor. ?Keep all follow-up visits. ?Contact a doctor if: ?You have any of these signs of infection in or around your incision: ?More redness, swelling, or pain. ?More fluid or blood. ?Warmth. ?Pus. ?A bad smell. ?You have a fever or chills. ?You have blood in your poop (stool). ?You have not pooped (had a bowel movement) in 2-3 days. ?Medicine does not help your pain. ?Get help right away if: ?You have chest pain, or you are short of breath. ?You feel faint or light-headed. ?You have very bad pain. ?You vomit and your pain is worse. ?You have pain, swelling, or redness in a leg. ?These symptoms may be an emergency. Get help right away. Call your local emergency services (911 in the [place] ?Do not wait to see if the symptoms will go away. ?Do not drive yourself to the hospital. ?Summary ?After this procedure, it is common to have mild discomfort, slight bruising, and mild swelling. ?Follow instructions from your doctor about how to take care of your cut from surgery (incision). Check every day for signs of infection. ?Do not lift heavy objects or play contact sports until your doctor says it is safe. ?Return to your normal activities as told by your doctor. ?This information is not intended to replace advice given to you by your health care provider. Make sure you discuss any questions you have with your health care provider. ?Document Revised: 06/21/2020 Document Reviewed: 06/21/2020 ?Elsevier Patient Education ? 2023 [employer] ?General Anesthesia, Adult, Care After ?This sheet gives you information about how to care for yourself after your procedure. Your health care provider may also give you more specific instructions. If you have problems or questions, contact your health care provider. ?What can I expect after the procedure? ?After the procedure, the following side effects are common: ?Pain or discomfort at the IV  site. ?Nausea. ?Vomiting. ?Sore throat. ?Trouble concentrating. ?Feeling cold or chills. ?Feeling weak or tired. ?Sleepiness and fatigue. ?Soreness and body aches. These side effects can affect parts of the body that were not involved in surgery. ?Follow these instructions at home: ?For the time period you were told by your health care provider: ? ?Rest. ?Do not participate in activities where you could fall or become injured. ?Do not drive or use machinery. ?Do not drink alcohol. ?Do not take sleeping pills or medicines that cause drowsiness. ?Do not make important decisions or sign legal documents. ?Do not take care of children on your own. ?Eating and drinking ?Follow any instructions from your health care provider about eating or drinking restrictions. ?When you feel hungry, start by eating small amounts of foods that are soft and easy to digest (bland), such as toast. Gradually return to your regular diet. ?Drink enough fluid to keep your urine pale yellow. ?If you vomit, rehydrate by  drinking water, juice, or clear broth. ?General instructions ?If you have sleep apnea, surgery and certain medicines can increase your risk for breathing problems. Follow instructions from your health care provider about wearing your sleep device: ?Anytime you are sleeping, including during daytime naps. ?While taking prescription pain medicines, sleeping medicines, or medicines that make you drowsy. ?Have a responsible adult stay with you for the time you are told. It is important to have someone help care for you until you are awake and alert. ?Return to your normal activities as told by your health care provider. Ask your health care provider what activities are safe for you. ?Take over-the-counter and prescription medicines only as told by your health care provider. ?If you smoke, do not smoke without supervision. ?Keep all follow-up visits as told by your health care provider. This is important. ?Contact a health care  provider if: ?You have nausea or vomiting that does not get better with medicine. ?You cannot eat or drink without vomiting. ?You have pain that does not get better with medicine. ?You are unable to pass urine. ?You develop a skin rash. ?You hav

## 2022-03-09 NOTE — Pre-Procedure Instructions (Signed)
Spoke with Altha Harm at Dr Arnoldo Morale office to let her know patient was a no show for her preop. ?

## 2022-03-09 NOTE — Telephone Encounter (Signed)
Received call from Kim with [facility] Pre-OP.  ? ?Reports that patient missed pre-op appointment.  ? ?Call placed to patient to inquire. States that she has been having N/V/D and thinks she has food poisoning. Advised that pre-op and surgery will need to be re-scheduled due to illness.  ? ?Surgery scheduler made aware and appointments re-scheduled as follows: ?Pre-op 03/20/222 @ 12:45pm ?Surgery 03/22/2022 ?

## 2022-03-13 DIAGNOSIS — Z01818 Encounter for other preprocedural examination: Secondary | ICD-10-CM

## 2022-03-17 ENCOUNTER — Ambulatory Visit
Admission: EM | Admit: 2022-03-17 | Discharge: 2022-03-17 | Disposition: A | Discharge status: Home / Self Care | Payer: Medicaid Other | Attending: Nurse Practitioner | Admitting: Nurse Practitioner

## 2022-03-17 ENCOUNTER — Ambulatory Visit (INDEPENDENT_AMBULATORY_CARE_PROVIDER_SITE_OTHER): Payer: Medicaid Other

## 2022-03-17 ENCOUNTER — Encounter: Payer: Self-pay | Admitting: Nurse Practitioner

## 2022-03-17 ENCOUNTER — Other Ambulatory Visit: Payer: Self-pay

## 2022-03-17 DIAGNOSIS — M79671 Pain in right foot: Secondary | ICD-10-CM

## 2022-03-17 NOTE — ED Provider Notes (Signed)
[facility]    CSN: [id number] Arrival date & time: 03/17/22  1502      History   Chief Complaint Chief Complaint  Patient presents with   Foot Pain    HPI Natasha Cain is a 46 y.o. female.   The patient is a 46 year old female who presents for right foot pain.  Patient's symptoms started this morning when she hit her right foot on the step.  Patient states that the pain was so intense that she "passed out".  Patient has pain in the arch of her foot.  Patient states she had a history of cauda equina syndrome as a child and had to have emergency surgery.  She states since that time she has had a higher arch in her right foot.  Patient does have a history of plantar fasciitis.  She was seen by podiatrist on 02/22/2022 and given a steroid injection.  She was scheduled to follow-up next month.  Since the injury to her foot this morning, she has pain, swelling, difficulty bearing weight, and tenderness in the right foot.  Denies numbness, or tingling.  She has taken ibuprofen for her symptoms.  She is currently wearing a brace that was given to her by the podiatrist.  The history is provided by the patient.   Past Medical History:  Diagnosis Date   Anxiety    Arthritis    Asthma    Complication of anesthesia    patietn wakes up in panic mode   COPD (chronic obstructive pulmonary disease) (HCC)    Depression    Hyperlipidemia    Hypertension    Hypothyroidism    PONV (postoperative nausea and vomiting)    Substance abuse (HCC)    Thyroid disease     There are no problems to display for this patient.   Past Surgical History:  Procedure Laterality Date   BACK SURGERY     CESAREAN SECTION  05/22/07   CHOLECYSTECTOMY  2009   KNEE SURGERY  07/2011   left   SPINE SURGERY  05/15/12    OB History   No obstetric history on file.      Home Medications    Prior to Admission medications   Medication Sig Start Date End Date Taking? Authorizing Provider  albuterol  (VENTOLIN HFA) 108 (90 Base) MCG/ACT inhaler Inhale 2 puffs into the lungs every 6 (six) hours as needed for wheezing or shortness of breath.    [provider]  amphetamine-dextroamphetamine (ADDERALL) 7.5 MG tablet Take 7.5 mg by mouth daily. 10/25/21   [provider]  ARIPiprazole (ABILIFY) 5 MG tablet Take 5 mg by mouth daily.    [provider]  buprenorphine-naloxone (SUBOXONE) 8-2 mg SUBL SL tablet Place 1 tablet under the tongue in the morning, at noon, and at bedtime.    [provider]  busPIRone (BUSPAR) 30 MG tablet Take 30 mg by mouth daily as needed (anxiety).    [provider]  chlorthalidone (HYGROTON) 25 MG tablet Take 25 mg by mouth daily.    [provider]  cloNIDine (CATAPRES) 0.3 MG tablet Take 0.3 mg by mouth 2 (two) times daily.    [provider]  ferrous sulfate 324 MG TBEC Take 324 mg by mouth daily as needed (iron deficiency).    [provider]  Fluticasone-Umeclidin-Vilant (TRELEGY ELLIPTA) 100-62.5-25 MCG/ACT AEPB Inhale 1 puff into the lungs daily.    [provider]  furosemide (LASIX) 20 MG tablet Take 20 mg by  mouth daily as needed for edema.    [provider]  gabapentin (NEURONTIN) 800 MG tablet Take 800 mg by mouth in the morning, at noon, in the evening, and at bedtime. 11/30/20   [provider]  hydrOXYzine (VISTARIL) 50 MG capsule Take 100 mg by mouth at bedtime. 10/25/21   [provider]  ibuprofen (ADVIL) 200 MG tablet Take 800 mg by mouth every 8 (eight) hours as needed for mild pain or moderate pain.    [provider]  levothyroxine (SYNTHROID) 125 MCG tablet Take 125 mcg by mouth daily before breakfast. 10/21/20   [provider]  meloxicam (MOBIC) 15 MG tablet Take 1 tablet (15 mg total) by mouth daily. 02/22/22   Felecia Shelling, DPM  Multiple Vitamins-Minerals (MULTIVITAMIN WITH MINERALS) tablet Take 1 tablet by mouth daily.     [provider]  omeprazole (PRILOSEC) 40 MG capsule Take 40 mg by mouth every morning.    [provider]  potassium chloride SA (KLOR-CON M) 20 MEQ tablet Take 2 tablets (40 mEq total) by mouth 2 (two) times daily. 11/28/21   Franky Macho, MD  rOPINIRole (REQUIP) 1 MG tablet Take 1 mg by mouth at bedtime.    [provider]  sertraline (ZOLOFT) 100 MG tablet Take 200 mg by mouth daily. 05/31/16   [provider]  tiZANidine (ZANAFLEX) 4 MG tablet Take 4 mg by mouth every 8 (eight) hours as needed for muscle spasms.    [provider]  topiramate (TOPAMAX) 100 MG tablet Take 100 mg by mouth at bedtime. 10/29/20   [provider]  Vitamin D, Ergocalciferol, (DRISDOL) 1.25 MG (50000 UNIT) CAPS capsule Take 50,000 Units by mouth every Sunday.    [provider]  VYVANSE 70 MG capsule Take 70 mg by mouth every morning. 10/25/21   [provider]    Family History Family History  Problem Relation Age of Onset   Cancer Mother        breast   Cancer Maternal Aunt        skin cancer on face   Cancer Paternal Uncle        colon   Cancer Maternal Grandfather        lung   Cancer Paternal Grandfather        colon   Cancer Cousin        colon    Social History Social History   Tobacco Use   Smoking status: Every Day    Packs/day: 0.50    Types: Cigarettes  Substance Use Topics   Alcohol use: No   Drug use: No     Allergies   Cymbalta [duloxetine hcl], Latex, Zorvolex [diclofenac], Ambien [zolpidem tartrate], Codeine, Darvon [propoxyphene], and Sulfa antibiotics   Review of Systems Review of Systems  Constitutional: Negative.   Musculoskeletal:  Positive for gait problem and joint swelling (right foot).       Right foot pain and swelling  Skin: Negative.   Psychiatric/Behavioral: Negative.      Physical Exam Triage Vital Signs ED Triage Vitals  Enc Vitals Group     BP 03/17/22 1545 (!) 126/53     Pulse  Rate 03/17/22 1545 74     Resp 03/17/22 1545 18     Temp 03/17/22 1545 97.8 F (36.6 C)     Temp Source 03/17/22 1545 Oral     SpO2 03/17/22 1545 96 %     Weight 03/17/22 1546 254 lb (115.2  kg)     Height 03/17/22 1546 5\' 7"  (1.702 m)     Head Circumference --      Peak Flow --      Pain Score 03/17/22 1546 10     Pain Loc --      Pain Edu? --      Excl. in GC? --    No data found.  Updated Vital Signs BP (!) 126/53 (BP Location: Right Arm)   Pulse 74   Temp 97.8 F (36.6 C) (Oral)   Resp 18   Ht 5\' 7"  (1.702 m)   Wt 254 lb (115.2 kg)   SpO2 96%   BMI 39.78 kg/m   Visual Acuity Right Eye Distance:   Left Eye Distance:   Bilateral Distance:    Right Eye Near:   Left Eye Near:    Bilateral Near:     Physical Exam Vitals reviewed.  Constitutional:      Appearance: Normal appearance.  HENT:     Head: Normocephalic and atraumatic.  Pulmonary:     Effort: Pulmonary effort is normal.  Musculoskeletal:     Right ankle: Normal.     Right foot: Decreased range of motion. Normal capillary refill. Swelling and tenderness (moderate tenderness in medial longitudinalr arch of right foot) present. Normal pulse.     Comments: Swelling noted to dorsal aspect of right foot, metatarsals and phalanges.  Skin:    General: Skin is warm and dry.     Capillary Refill: Capillary refill takes less than 2 seconds.  Neurological:     General: No focal deficit present.     Mental Status: She is alert and oriented to person, place, and time.  Psychiatric:        Mood and Affect: Mood normal.        Behavior: Behavior normal.     UC Treatments / Results  Labs (all labs ordered are listed, but only abnormal results are displayed) Labs Reviewed - No data to display  EKG   Radiology DG Foot Complete Right  Result Date: 03/17/2022 CLINICAL DATA:  RIGHT foot pain after falling today, pain from the 2nd to 5th metatarsals EXAM: RIGHT FOOT COMPLETE - 3+ VIEW COMPARISON:  02/22/2022  FINDINGS: Osseous demineralization. Degenerative changes at first TMT joint. Joint spaces otherwise preserved. Small os intermetatarseum between first and second metatarsal bases, accessory ossicle. No acute fracture, dislocation, or bone destruction. Dorsal soft tissue swelling overlying the distal metatarsals. IMPRESSION: No acute osseous abnormalities. Degenerative changes first TMT joint. Electronically Signed   By: Ulyses Southward M.D.   On: 03/17/2022 16:14    Procedures Procedures (including critical care time)  Medications Ordered in UC Medications - No data to display  Initial Impression / Assessment and Plan / UC Course  I have reviewed the triage vital signs and the nursing notes.  Pertinent labs & imaging results that were available during my care of the patient were reviewed by me and considered in my medical decision making (see chart for details).  The patient is a 46 year old female who presents with pain in the right foot.  Patient was stepping up into her home this morning when she hit the right foot.  She has moderate pain in the medial longitudinal arch of the right foot with swelling in the dorsal aspect and phalanges.  X-ray is negative for fractures.  We will recommend rice therapy until her symptoms improve, rest, ice, compression, and elevation.  We will also prescribe ibuprofen to  help with her pain.  Patient advised to use ice to the right foot instructions provided on how to use ice.  Recommend that she follow-up with podiatry as scheduled.  Discussed with the patient that if she has any concern about ligaments or tendons in the foot, she needed advanced imaging.  Patient advised to continue using her foot brace, an Ace wrap was provided today to add additional compression.  Follow-up as needed. Final Clinical Impressions(s) / UC Diagnoses   Final diagnoses:  None   Discharge Instructions   None    ED Prescriptions   None    PDMP not reviewed this encounter.    Abran Cantor, NP 03/17/22 1627

## 2022-03-17 NOTE — Discharge Instructions (Addendum)
Your xray is negative for fracture today. ?Continue the meloxicam you have been previously prescribed. ?RICE therapy rest, ice, compression, and elevation until your symptoms improve.  Weightbearing as tolerated. ?Follow-up with your podiatrist as scheduled.  As discussed, we cannot see tendons or ligaments on x-ray.  If additional imaging is required, follow-up with your podiatrist or orthopedics. ?Follow-up as needed. ? ?

## 2022-03-17 NOTE — ED Triage Notes (Addendum)
Pt reports is currently being seen by podiatrist for plantar fascitis and neuropathy. Pt reports received injection in right foot recently with little to no change in pain. pt reports foot pain was exacerbated by fall this am. Pt reports went to step up and middle of foot hit step and reports due to pain, pt passed out. Pt reports ever since has had intense pain in right foot and no available appointments today at podiatrist office.  ? ?Pt noted to have brace on right ankle and reports no change in pain.pt requesting right foot xray. ?

## 2022-03-20 ENCOUNTER — Encounter (HOSPITAL_COMMUNITY): Payer: Medicaid Other

## 2022-03-20 ENCOUNTER — Encounter (HOSPITAL_COMMUNITY): Payer: Self-pay

## 2022-03-20 DIAGNOSIS — F5081 Binge eating disorder: Secondary | ICD-10-CM | POA: Diagnosis not present

## 2022-03-20 DIAGNOSIS — F331 Major depressive disorder, recurrent, moderate: Secondary | ICD-10-CM | POA: Diagnosis not present

## 2022-03-20 DIAGNOSIS — F4310 Post-traumatic stress disorder, unspecified: Secondary | ICD-10-CM | POA: Diagnosis not present

## 2022-03-20 DIAGNOSIS — F411 Generalized anxiety disorder: Secondary | ICD-10-CM | POA: Diagnosis not present

## 2022-03-20 DIAGNOSIS — F902 Attention-deficit hyperactivity disorder, combined type: Secondary | ICD-10-CM | POA: Diagnosis not present

## 2022-03-22 DIAGNOSIS — Z01818 Encounter for other preprocedural examination: Secondary | ICD-10-CM

## 2022-03-27 ENCOUNTER — Ambulatory Visit: Payer: Medicaid Other | Admitting: Podiatry

## 2022-03-28 DIAGNOSIS — Z6841 Body Mass Index (BMI) 40.0 and over, adult: Secondary | ICD-10-CM | POA: Diagnosis not present

## 2022-03-28 DIAGNOSIS — J40 Bronchitis, not specified as acute or chronic: Secondary | ICD-10-CM | POA: Diagnosis not present

## 2022-03-28 NOTE — Patient Instructions (Addendum)
? Your procedure is scheduled on: 04/03/2022 ? Report to [facility] Main Entrance at   8:00  AM. ? Call this number if you have problems the morning of surgery: [phone] ? ? Remember: ? ? Do not Eat or Drink after midnight  ? ?      No Smoking the morning of surgery ? : ? Take these medicines the morning of surgery with A SIP OF WATER: Abilify, Atenolol, Gabapentin, Levothyroxine, omeprazole and zoloft ? ? Do not wear jewelry, make-up or nail polish. ? Do not wear lotions, powders, or perfumes. You may wear deodorant. ? Do not shave 48 hours prior to surgery. Men may shave face and neck. ? Do not bring valuables to the hospital. ? Contacts, dentures or bridgework may not be worn into surgery. ? Leave suitcase in the car. After surgery it may be brought to your room. ? For patients admitted to the hospital, checkout time is 11:00 AM the day of discharge. ? ? Patients discharged the day of surgery will not be allowed to drive home. ?  ? Special Instructions: Shower using CHG night before surgery and shower the day of surgery use CHG.  Use special wash - you have one bottle of CHG for all showers.  You should use approximately 1/2 of the bottle for each shower.  ?How to Use Chlorhexidine for Bathing ?Chlorhexidine gluconate (CHG) is a germ-killing (antiseptic) solution that is used to clean the skin. It can get rid of the bacteria that normally live on the skin and can keep them away for about 24 hours. To clean your skin with CHG, you may be given: ?A CHG solution to use in the shower or as part of a sponge bath. ?A prepackaged cloth that contains CHG. ?Cleaning your skin with CHG may help lower the risk for infection: ?While you are staying in the intensive care unit of the hospital. ?If you have a vascular access, such as a central line, to provide short-term or long-term access to your veins. ?If you have a catheter to drain urine from your bladder. ?If you are on a ventilator. A ventilator is a machine that  helps you breathe by moving air in and out of your lungs. ?After surgery. ?What are the risks? ?Risks of using CHG include: ?A skin reaction. ?Hearing loss, if CHG gets in your ears and you have a perforated eardrum. ?Eye injury, if CHG gets in your eyes and is not rinsed out. ?The CHG product catching fire. ?Make sure that you avoid smoking and flames after applying CHG to your skin. ?Do not use CHG: ?If you have a chlorhexidine allergy or have previously reacted to chlorhexidine. ?On babies younger than 24 months of age. ?How to use CHG solution ?Use CHG only as told by your health care provider, and follow the instructions on the label. ?Use the full amount of CHG as directed. Usually, this is one bottle. ?During a shower ?Follow these steps when using CHG solution during a shower (unless your health care provider gives you different instructions): ?Start the shower. ?Use your normal soap and shampoo to wash your face and hair. ?Turn off the shower or move out of the shower stream. ?Pour the CHG onto a clean washcloth. Do not use any type of brush or rough-edged sponge. ?Starting at your neck, lather your body down to your toes. Make sure you follow these instructions: ?If you will be having surgery, pay special attention to the part of your body where  you will be having surgery. Scrub this area for at least 1 minute. ?Do not use CHG on your head or face. If the solution gets into your ears or eyes, rinse them well with water. ?Avoid your genital area. ?Avoid any areas of skin that have broken skin, cuts, or scrapes. ?Scrub your back and under your arms. Make sure to wash skin folds. ?Let the lather sit on your skin for 1-2 minutes or as long as told by your health care provider. ?Thoroughly rinse your entire body in the shower. Make sure that all body creases and crevices are rinsed well. ?Dry off with a clean towel. Do not put any substances on your body afterward--such as powder, lotion, or perfume--unless you  are told to do so by your health care provider. Only use lotions that are recommended by the manufacturer. ?Put on clean clothes or pajamas. ?If it is the night before your surgery, sleep in clean sheets. ? ?During a sponge bath ?Follow these steps when using CHG solution during a sponge bath (unless your health care provider gives you different instructions): ?Use your normal soap and shampoo to wash your face and hair. ?Pour the CHG onto a clean washcloth. ?Starting at your neck, lather your body down to your toes. Make sure you follow these instructions: ?If you will be having surgery, pay special attention to the part of your body where you will be having surgery. Scrub this area for at least 1 minute. ?Do not use CHG on your head or face. If the solution gets into your ears or eyes, rinse them well with water. ?Avoid your genital area. ?Avoid any areas of skin that have broken skin, cuts, or scrapes. ?Scrub your back and under your arms. Make sure to wash skin folds. ?Let the lather sit on your skin for 1-2 minutes or as long as told by your health care provider. ?Using a different clean, wet washcloth, thoroughly rinse your entire body. Make sure that all body creases and crevices are rinsed well. ?Dry off with a clean towel. Do not put any substances on your body afterward--such as powder, lotion, or perfume--unless you are told to do so by your health care provider. Only use lotions that are recommended by the manufacturer. ?Put on clean clothes or pajamas. ?If it is the night before your surgery, sleep in clean sheets. ?How to use CHG prepackaged cloths ?Only use CHG cloths as told by your health care provider, and follow the instructions on the label. ?Use the CHG cloth on clean, dry skin. ?Do not use the CHG cloth on your head or face unless your health care provider tells you to. ?When washing with the CHG cloth: ?Avoid your genital area. ?Avoid any areas of skin that have broken skin, cuts, or  scrapes. ?Before surgery ?Follow these steps when using a CHG cloth to clean before surgery (unless your health care provider gives you different instructions): ?Using the CHG cloth, vigorously scrub the part of your body where you will be having surgery. Scrub using a back-and-forth motion for 3 minutes. The area on your body should be completely wet with CHG when you are done scrubbing. ?Do not rinse. Discard the cloth and let the area air-dry. Do not put any substances on the area afterward, such as powder, lotion, or perfume. ?Put on clean clothes or pajamas. ?If it is the night before your surgery, sleep in clean sheets. ? ?For general bathing ?Follow these steps when using CHG cloths for general  bathing (unless your health care provider gives you different instructions). ?Use a separate CHG cloth for each area of your body. Make sure you wash between any folds of skin and between your fingers and toes. Wash your body in the following order, switching to a new cloth after each step: ?The front of your neck, shoulders, and chest. ?Both of your arms, under your arms, and your hands. ?Your stomach and groin area, avoiding the genitals. ?Your right leg and foot. ?Your left leg and foot. ?The back of your neck, your back, and your buttocks. ?Do not rinse. Discard the cloth and let the area air-dry. Do not put any substances on your body afterward--such as powder, lotion, or perfume--unless you are told to do so by your health care provider. Only use lotions that are recommended by the manufacturer. ?Put on clean clothes or pajamas. ?Contact a health care provider if: ?Your skin gets irritated after scrubbing. ?You have questions about using your solution or cloth. ?You swallow any chlorhexidine. Call your local poison control center ([phone] in the [place] ?Get help right away if: ?Your eyes itch badly, or they become very red or swollen. ?Your skin itches badly and is red or swollen. ?Your hearing  changes. ?You have trouble seeing. ?You have swelling or tingling in your mouth or throat. ?You have trouble breathing. ?These symptoms may represent a serious problem that is an emergency. Do not wait to see if the sympto

## 2022-03-30 ENCOUNTER — Telehealth: Payer: Self-pay | Admitting: *Deleted

## 2022-03-30 ENCOUNTER — Encounter (HOSPITAL_COMMUNITY)
Admission: RE | Admit: 2022-03-30 | Discharge: 2022-03-30 | Disposition: A | Discharge status: Home / Self Care | Payer: Medicaid Other | Source: Ambulatory Visit | Attending: General Surgery | Admitting: General Surgery

## 2022-03-30 VITALS — BP 137/51 | HR 65 | Temp 97.8°F | Resp 18 | Ht 67.0 in | Wt 260.0 lb

## 2022-03-30 DIAGNOSIS — Z01818 Encounter for other preprocedural examination: Secondary | ICD-10-CM

## 2022-03-30 DIAGNOSIS — Z01812 Encounter for preprocedural laboratory examination: Secondary | ICD-10-CM | POA: Insufficient documentation

## 2022-03-30 LAB — BASIC METABOLIC PANEL
Anion gap: 15 (ref 5–15)
BUN: 19 mg/dL (ref 6–20)
CO2: 22 mmol/L (ref 22–32)
Calcium: 9.7 mg/dL (ref 8.9–10.3)
Chloride: 99 mmol/L (ref 98–111)
Creatinine, Ser: 1.21 mg/dL — ABNORMAL HIGH (ref 0.44–1.00)
GFR, Estimated: 57 mL/min — ABNORMAL LOW (ref >60)
Glucose, Bld: 180 mg/dL — ABNORMAL HIGH (ref 70–99)
Potassium: 2.6 mmol/L — CL (ref 3.5–5.1)
Sodium: 136 mmol/L (ref 135–145)

## 2022-03-30 LAB — CBC WITH DIFFERENTIAL/PLATELET
Abs Immature Granulocytes: 0.05 10*3/uL (ref 0.00–0.07)
Basophils Absolute: 0.1 10*3/uL (ref 0.0–0.1)
Basophils Relative: 1 %
Eosinophils Absolute: 0.1 10*3/uL (ref 0.0–0.5)
Eosinophils Relative: 1 %
HCT: 39.3 % (ref 36.0–46.0)
Hemoglobin: 12.3 g/dL (ref 12.0–15.0)
Immature Granulocytes: 1 %
Lymphocytes Relative: 43 %
Lymphs Abs: 4.6 10*3/uL — ABNORMAL HIGH (ref 0.7–4.0)
MCH: 24.8 pg — ABNORMAL LOW (ref 26.0–34.0)
MCHC: 31.3 g/dL (ref 30.0–36.0)
MCV: 79.4 fL — ABNORMAL LOW (ref 80.0–100.0)
Monocytes Absolute: 0.6 10*3/uL (ref 0.1–1.0)
Monocytes Relative: 6 %
Neutro Abs: 5.4 10*3/uL (ref 1.7–7.7)
Neutrophils Relative %: 48 %
Platelets: 344 10*3/uL (ref 150–400)
RBC: 4.95 MIL/uL (ref 3.87–5.11)
RDW: 15.6 % — ABNORMAL HIGH (ref 11.5–15.5)
WBC: 10.8 10*3/uL — ABNORMAL HIGH (ref 4.0–10.5)
nRBC: 0 % (ref 0.0–0.2)

## 2022-03-30 LAB — HCG, SERUM, QUALITATIVE: Preg, Serum: NEGATIVE

## 2022-03-30 MED ORDER — POTASSIUM CHLORIDE CRYS ER 20 MEQ PO TBCR: 20.0000 meq | EXTENDED_RELEASE_TABLET | Freq: Three times a day (TID) | ORAL | 0 refills | Status: DC

## 2022-03-30 NOTE — Progress Notes (Signed)
?   03/30/22 1431  ?OBSTRUCTIVE SLEEP APNEA  ?Have you ever been diagnosed with sleep apnea through a sleep study? No  ?If yes, do you have and use a CPAP or BPAP machine every night? 0  ?Do you snore loudly (loud enough to be heard through closed doors)?  1  ?Do you often feel tired, fatigued, or sleepy during the daytime (such as falling asleep during driving or talking to someone)? 0  ?Has anyone observed you stop breathing during your sleep? 1  ?Do you have, or are you being treated for high blood pressure? 1  ?BMI more than 35 kg/m2? 1  ?Age > 50 (1-yes) 0  ?Neck circumference greater than:Female 16 inches or larger, Female 17inches or larger? 1  ?Female Gender (Yes=1) 0  ?Obstructive Sleep Apnea Score 5  ?Score 5 or greater  Results sent to PCP  ? ? ?

## 2022-03-30 NOTE — Telephone Encounter (Signed)
Received call from Dr. Lovell Sheehan with critical result of BMP.  ? ?K+ low at 2.6. New orders obtained for Klor- Con 20 mEq PO TID. Advised that K+ level will be repeated prior to surgery.  ? ?Call placed to patient and patient made aware. Agreeable to plan. Prescription sent to pharmacy.  ?

## 2022-03-31 NOTE — Progress Notes (Signed)
Dr Lovell Sheehan and Dr Johnnette Litter notified of K+ 2.6.  Patient is aware to pick up K+ po 3 times a day.  ISTAT on arrival prior to surgery ?

## 2022-04-03 ENCOUNTER — Encounter (HOSPITAL_COMMUNITY)
Admission: RE | Disposition: A | Discharge status: Home / Self Care | Payer: Self-pay | Source: Home / Self Care | Attending: General Surgery

## 2022-04-03 ENCOUNTER — Other Ambulatory Visit: Payer: Self-pay

## 2022-04-03 ENCOUNTER — Ambulatory Visit (HOSPITAL_BASED_OUTPATIENT_CLINIC_OR_DEPARTMENT_OTHER): Payer: Medicaid Other | Admitting: Anesthesiology

## 2022-04-03 ENCOUNTER — Ambulatory Visit (HOSPITAL_COMMUNITY)
Admission: RE | Admit: 2022-04-03 | Discharge: 2022-04-03 | Disposition: A | Discharge status: Home / Self Care | Payer: Medicaid Other | Attending: General Surgery | Admitting: General Surgery

## 2022-04-03 ENCOUNTER — Ambulatory Visit: Payer: Medicaid Other | Admitting: Podiatry

## 2022-04-03 ENCOUNTER — Ambulatory Visit (HOSPITAL_COMMUNITY): Payer: Medicaid Other | Admitting: Anesthesiology

## 2022-04-03 ENCOUNTER — Encounter (HOSPITAL_COMMUNITY): Payer: Self-pay | Admitting: General Surgery

## 2022-04-03 DIAGNOSIS — K432 Incisional hernia without obstruction or gangrene: Secondary | ICD-10-CM | POA: Diagnosis not present

## 2022-04-03 DIAGNOSIS — G4730 Sleep apnea, unspecified: Secondary | ICD-10-CM | POA: Insufficient documentation

## 2022-04-03 DIAGNOSIS — I10 Essential (primary) hypertension: Secondary | ICD-10-CM | POA: Diagnosis not present

## 2022-04-03 DIAGNOSIS — J45909 Unspecified asthma, uncomplicated: Secondary | ICD-10-CM | POA: Diagnosis not present

## 2022-04-03 DIAGNOSIS — E309 Disorder of puberty, unspecified: Secondary | ICD-10-CM | POA: Insufficient documentation

## 2022-04-03 DIAGNOSIS — Z79890 Hormone replacement therapy (postmenopausal): Secondary | ICD-10-CM | POA: Diagnosis not present

## 2022-04-03 DIAGNOSIS — F419 Anxiety disorder, unspecified: Secondary | ICD-10-CM | POA: Diagnosis not present

## 2022-04-03 DIAGNOSIS — Z01818 Encounter for other preprocedural examination: Secondary | ICD-10-CM

## 2022-04-03 DIAGNOSIS — F32A Depression, unspecified: Secondary | ICD-10-CM | POA: Diagnosis not present

## 2022-04-03 DIAGNOSIS — F988 Other specified behavioral and emotional disorders with onset usually occurring in childhood and adolescence: Secondary | ICD-10-CM | POA: Diagnosis not present

## 2022-04-03 DIAGNOSIS — F17200 Nicotine dependence, unspecified, uncomplicated: Secondary | ICD-10-CM | POA: Diagnosis not present

## 2022-04-03 DIAGNOSIS — J449 Chronic obstructive pulmonary disease, unspecified: Secondary | ICD-10-CM | POA: Insufficient documentation

## 2022-04-03 DIAGNOSIS — Z6841 Body Mass Index (BMI) 40.0 and over, adult: Secondary | ICD-10-CM | POA: Diagnosis not present

## 2022-04-03 DIAGNOSIS — Z79899 Other long term (current) drug therapy: Secondary | ICD-10-CM | POA: Insufficient documentation

## 2022-04-03 DIAGNOSIS — F17210 Nicotine dependence, cigarettes, uncomplicated: Secondary | ICD-10-CM

## 2022-04-03 DIAGNOSIS — E039 Hypothyroidism, unspecified: Secondary | ICD-10-CM

## 2022-04-03 DIAGNOSIS — K219 Gastro-esophageal reflux disease without esophagitis: Secondary | ICD-10-CM | POA: Insufficient documentation

## 2022-04-03 HISTORY — PX: INCISIONAL HERNIA REPAIR: SHX193

## 2022-04-03 SURGERY — REPAIR, HERNIA, INCISIONAL
Anesthesia: General | Site: Abdomen

## 2022-04-03 MED ORDER — MIDAZOLAM HCL 2 MG/2ML IJ SOLN
INTRAMUSCULAR | Status: AC
  Filled 2022-04-03: qty 2

## 2022-04-03 MED ORDER — HYDROMORPHONE HCL 1 MG/ML IJ SOLN
0.2500 mg | INTRAMUSCULAR | Status: DC | PRN
  Administered 2022-04-03: 0.5 mg via INTRAVENOUS
  Filled 2022-04-03: qty 0.5

## 2022-04-03 MED ORDER — CEFAZOLIN IN SODIUM CHLORIDE 3-0.9 GM/100ML-% IV SOLN
INTRAVENOUS | Status: AC
  Filled 2022-04-03: qty 100

## 2022-04-03 MED ORDER — PROPOFOL 10 MG/ML IV BOLUS
INTRAVENOUS | Status: DC | PRN
  Administered 2022-04-03: 50 mg via INTRAVENOUS
  Administered 2022-04-03: 150 mg via INTRAVENOUS

## 2022-04-03 MED ORDER — MEPERIDINE HCL 50 MG/ML IJ SOLN: 6.2500 mg | INTRAMUSCULAR | Status: DC | PRN

## 2022-04-03 MED ORDER — CEFAZOLIN IN SODIUM CHLORIDE 3-0.9 GM/100ML-% IV SOLN
3.0000 g | INTRAVENOUS | Status: AC
  Administered 2022-04-03: 3 g via INTRAVENOUS

## 2022-04-03 MED ORDER — DEXMEDETOMIDINE HCL IN NACL 80 MCG/20ML IV SOLN
INTRAVENOUS | Status: AC
  Filled 2022-04-03: qty 20

## 2022-04-03 MED ORDER — ONDANSETRON HCL 4 MG/2ML IJ SOLN
INTRAMUSCULAR | Status: DC | PRN
  Administered 2022-04-03: 4 mg via INTRAVENOUS

## 2022-04-03 MED ORDER — BUPIVACAINE LIPOSOME 1.3 % IJ SUSP
INTRAMUSCULAR | Status: DC | PRN
  Administered 2022-04-03: 20 mL

## 2022-04-03 MED ORDER — CHLORHEXIDINE GLUCONATE 0.12 % MT SOLN: 15.0000 mL | Freq: Once | OROMUCOSAL | Status: DC

## 2022-04-03 MED ORDER — SUCCINYLCHOLINE CHLORIDE 200 MG/10ML IV SOSY
PREFILLED_SYRINGE | INTRAVENOUS | Status: AC
  Filled 2022-04-03: qty 10

## 2022-04-03 MED ORDER — LACTATED RINGERS IV SOLN: INTRAVENOUS | Status: DC

## 2022-04-03 MED ORDER — CHLORHEXIDINE GLUCONATE CLOTH 2 % EX PADS: 6.0000 | MEDICATED_PAD | Freq: Once | CUTANEOUS | Status: DC

## 2022-04-03 MED ORDER — LIDOCAINE 2% (20 MG/ML) 5 ML SYRINGE
INTRAMUSCULAR | Status: DC | PRN
  Administered 2022-04-03: 100 mg via INTRAVENOUS

## 2022-04-03 MED ORDER — ROCURONIUM BROMIDE 10 MG/ML (PF) SYRINGE
PREFILLED_SYRINGE | INTRAVENOUS | Status: DC | PRN
Start: 2022-04-03 — End: 2022-04-03
  Administered 2022-04-03: 40 mg via INTRAVENOUS

## 2022-04-03 MED ORDER — ORAL CARE MOUTH RINSE: 15.0000 mL | Freq: Once | OROMUCOSAL | Status: DC

## 2022-04-03 MED ORDER — CHLORHEXIDINE GLUCONATE 0.12 % MT SOLN
OROMUCOSAL | Status: AC
  Filled 2022-04-03: qty 15

## 2022-04-03 MED ORDER — SUGAMMADEX SODIUM 200 MG/2ML IV SOLN
INTRAVENOUS | Status: DC | PRN
  Administered 2022-04-03: 400 mg via INTRAVENOUS

## 2022-04-03 MED ORDER — ONDANSETRON HCL 4 MG/2ML IJ SOLN: 4.0000 mg | Freq: Once | INTRAMUSCULAR | Status: DC | PRN

## 2022-04-03 MED ORDER — SUCCINYLCHOLINE CHLORIDE 200 MG/10ML IV SOSY
PREFILLED_SYRINGE | INTRAVENOUS | Status: DC | PRN
  Administered 2022-04-03: 120 mg via INTRAVENOUS

## 2022-04-03 MED ORDER — DEXAMETHASONE SODIUM PHOSPHATE 10 MG/ML IJ SOLN
INTRAMUSCULAR | Status: AC
  Filled 2022-04-03: qty 1

## 2022-04-03 MED ORDER — FENTANYL CITRATE (PF) 250 MCG/5ML IJ SOLN
INTRAMUSCULAR | Status: DC | PRN
  Administered 2022-04-03 (×2): 50 ug via INTRAVENOUS

## 2022-04-03 MED ORDER — IPRATROPIUM-ALBUTEROL 0.5-2.5 (3) MG/3ML IN SOLN: 3.0000 mL | RESPIRATORY_TRACT | Status: DC | PRN

## 2022-04-03 MED ORDER — DEXMEDETOMIDINE (PRECEDEX) IN NS 20 MCG/5ML (4 MCG/ML) IV SYRINGE
PREFILLED_SYRINGE | INTRAVENOUS | Status: DC | PRN
  Administered 2022-04-03: 8 ug via INTRAVENOUS
  Administered 2022-04-03: 12 ug via INTRAVENOUS
  Administered 2022-04-03: 8 ug via INTRAVENOUS
  Administered 2022-04-03: 12 ug via INTRAVENOUS

## 2022-04-03 MED ORDER — MIDAZOLAM HCL 2 MG/2ML IJ SOLN
INTRAMUSCULAR | Status: DC | PRN
  Administered 2022-04-03 (×2): 2 mg via INTRAVENOUS

## 2022-04-03 MED ORDER — 0.9 % SODIUM CHLORIDE (POUR BTL) OPTIME
TOPICAL | Status: DC | PRN
  Administered 2022-04-03: 1000 mL

## 2022-04-03 MED ORDER — DEXAMETHASONE SODIUM PHOSPHATE 4 MG/ML IJ SOLN
INTRAMUSCULAR | Status: DC | PRN
  Administered 2022-04-03: 10 mg via INTRAVENOUS

## 2022-04-03 MED ORDER — ROCURONIUM BROMIDE 10 MG/ML (PF) SYRINGE
PREFILLED_SYRINGE | INTRAVENOUS | Status: AC
  Filled 2022-04-03: qty 10

## 2022-04-03 MED ORDER — LIDOCAINE HCL (PF) 2 % IJ SOLN
INTRAMUSCULAR | Status: AC
  Filled 2022-04-03: qty 5

## 2022-04-03 MED ORDER — ONDANSETRON HCL 4 MG/2ML IJ SOLN
INTRAMUSCULAR | Status: AC
  Filled 2022-04-03: qty 2

## 2022-04-03 MED ORDER — FENTANYL CITRATE (PF) 250 MCG/5ML IJ SOLN
INTRAMUSCULAR | Status: AC
  Filled 2022-04-03: qty 5

## 2022-04-03 SURGICAL SUPPLY — 35 items
ADH SKN CLS APL DERMABOND .7 (GAUZE/BANDAGES/DRESSINGS) ×1
APL PRP STRL LF DISP 70% ISPRP (MISCELLANEOUS) ×1
BLADE SURG SZ11 CARB STEEL (BLADE) ×2 IMPLANT
CHLORAPREP W/TINT 26 (MISCELLANEOUS) ×2 IMPLANT
CLOTH BEACON ORANGE TIMEOUT ST (SAFETY) ×2 IMPLANT
COVER LIGHT HANDLE STERIS (MISCELLANEOUS) ×4 IMPLANT
DERMABOND ADVANCED (GAUZE/BANDAGES/DRESSINGS) ×1
DERMABOND ADVANCED .7 DNX12 (GAUZE/BANDAGES/DRESSINGS) ×1 IMPLANT
ELECT REM PT RETURN 9FT ADLT (ELECTROSURGICAL) ×2
ELECTRODE REM PT RTRN 9FT ADLT (ELECTROSURGICAL) ×1 IMPLANT
GAUZE 4X4 16PLY ~~LOC~~+RFID DBL (SPONGE) ×2 IMPLANT
GLOVE BIOGEL PI IND STRL 7.0 (GLOVE) ×2 IMPLANT
GLOVE BIOGEL PI IND STRL 7.5 (GLOVE) IMPLANT
GLOVE BIOGEL PI INDICATOR 7.0 (GLOVE) ×2
GLOVE BIOGEL PI INDICATOR 7.5 (GLOVE) ×1
GLOVE SURG SS PI 7.0 STRL IVOR (GLOVE) ×3 IMPLANT
GLOVE SURG SS PI 7.5 STRL IVOR (GLOVE) ×2 IMPLANT
GOWN STRL REUS W/TWL LRG LVL3 (GOWN DISPOSABLE) ×6 IMPLANT
INST SET MAJOR GENERAL (KITS) ×1 IMPLANT
KIT TURNOVER KIT A (KITS) ×2 IMPLANT
MANIFOLD NEPTUNE II (INSTRUMENTS) ×2 IMPLANT
MESH VENTRALEX ST 1-7/10 CRC S (Mesh General) ×1 IMPLANT
NDL HYPO 21X1.5 SAFETY (NEEDLE) ×1 IMPLANT
NEEDLE HYPO 21X1.5 SAFETY (NEEDLE) ×2 IMPLANT
NS IRRIG 1000ML POUR BTL (IV SOLUTION) ×2 IMPLANT
PACK MINOR (CUSTOM PROCEDURE TRAY) ×1 IMPLANT
PAD ARMBOARD 7.5X6 YLW CONV (MISCELLANEOUS) ×2 IMPLANT
SET BASIN LINEN APH (SET/KITS/TRAYS/PACK) ×2 IMPLANT
SUT ETHIBOND 0 MO6 C/R (SUTURE) ×1 IMPLANT
SUT MNCRL AB 4-0 PS2 18 (SUTURE) ×2 IMPLANT
SUT VIC AB 2-0 CT1 27 (SUTURE) ×2
SUT VIC AB 2-0 CT1 TAPERPNT 27 (SUTURE) IMPLANT
SUT VIC AB 3-0 SH 27 (SUTURE) ×2
SUT VIC AB 3-0 SH 27X BRD (SUTURE) ×1 IMPLANT
SYR 20ML LL LF (SYRINGE) ×4 IMPLANT

## 2022-04-03 NOTE — Anesthesia Procedure Notes (Signed)
Procedure Name: Intubation ?Date/Time: 04/03/2022 9:25 AM ?Performed by: Orlie Dakin, CRNA ?Pre-anesthesia Checklist: Patient identified, Emergency Drugs available, Suction available and Patient being monitored ?Patient Re-evaluated:Patient Re-evaluated prior to induction ?Oxygen Delivery Method: Circle system utilized ?Preoxygenation: Pre-oxygenation with 100% oxygen ?Induction Type: IV induction, Rapid sequence and Cricoid Pressure applied ?Laryngoscope Size: Glidescope and 3 ?Grade View: Grade I ?Tube type: Oral ?Tube size: 7.5 mm ?Number of attempts: 1 ?Airway Equipment and Method: Rigid stylet and Video-laryngoscopy ?Placement Confirmation: ETT inserted through vocal cords under direct vision, positive ETCO2 and breath sounds checked- equal and bilateral ?Secured at: 22 cm ?Tube secured with: Tape ?Dental Injury: Teeth and Oropharynx as per pre-operative assessment  ?Comments: Glidescope used due to neck discomfort, short thick neck, small mouth opening. ? ? ? ? ?

## 2022-04-03 NOTE — Interval H&P Note (Signed)
History and Physical Interval Note: ? ?04/03/2022 ?8:31 AM ? ?Natasha Cain  has presented today for surgery, with the diagnosis of INCISIONAL HERNIA.  The various methods of treatment have been discussed with the patient and family. After consideration of risks, benefits and other options for treatment, the patient has consented to  Procedure(s): ?HERNIA REPAIR INCISIONAL W/ MESH (N/A) as a surgical intervention.  The patient's history has been reviewed, patient examined, no change in status, stable for surgery.  I have reviewed the patient's chart and labs.  Questions were answered to the patient's satisfaction.   ? ? ?Franky Macho ? ? ?

## 2022-04-03 NOTE — Op Note (Signed)
Patient:  Natasha Cain ? ?DOB:  05/17/1976 ? ?MRN:  [id number] ? ? ?Preop Diagnosis: Incisional hernia ? ?Postop Diagnosis: Same ? ?Procedure: Incisional herniorrhaphy with mesh ? ?Surgeon: Aviva Signs, MD ? ?Anes: General ? ?Indications: Patient is a 46 year old white female who presents with an incisional hernia at the umbilicus.  The risks and benefits of the procedure including bleeding, infection, mesh use, and the possibility of recurrence of the hernia were fully explained to the patient, who gave informed consent. ? ?Procedure note: The patient was placed in the supine position.  After general anesthesia was administered, the abdomen was prepped and draped using usual sterile technique with ChloraPrep.  Surgical site confirmation was performed. ? ?An infraumbilical incision was made down to the fascia.  The umbilicus was freed away from the underlying fascia.  The hernia sac was found.  Omentum was noted within the hernia sac and this was reduced without difficulty.  Hernia sac was excised down to the fascia.  The resultant defect was approximately 2 cm in its greatest diameter.  I did palpate the underside of the ventral wall and no other hernias were noted.  A 4.3 cm Bard Ventralax ST patch was then inserted and secured to the fascia using 0 Ethibond interrupted sutures.  The abdominal wall was closed over the mesh and a transverse pattern using 0 Ethibond interrupted sutures.  The umbilicus was secured back to the fascia using 2-0 Vicryl interrupted suture.  Subcutaneous layer was reapproximated using 3-0 Vicryl interrupted sutures.  Exparel was instilled into the surrounding skin.  The skin was closed using a 4-0 Monocryl subcuticular suture.  Dermabond was applied. ? ?All tape and needle counts were correct at the end of the procedure.  The patient was awakened and transferred to PACU in stable condition. ? ?Complications: None ? ?EBL: Minimal ? ?Specimen: None ? ? ?  ?

## 2022-04-03 NOTE — Anesthesia Preprocedure Evaluation (Addendum)
Anesthesia Evaluation  ?Patient identified by MRN, date of birth, ID band ?Patient awake ? ? ? ?Reviewed: ?Allergy & Precautions, NPO status , Patient's Chart, lab work & pertinent test results, reviewed documented beta blocker date and time  ? ?History of Anesthesia Complications ?(+) PONV and history of anesthetic complications ? ?Airway ?Mallampati: II ? ?TM Distance: >3 FB ?Neck ROM: Full ? ?Mouth opening: Limited Mouth Opening ? Dental ? ?(+) Dental Advisory Given, Missing ?  ?Pulmonary ?asthma , sleep apnea , pneumonia (bronchitis on prednisone), resolved, COPD,  COPD inhaler, Current Smoker and Patient abstained from smoking.,  ?  ?Pulmonary exam normal ?breath sounds clear to auscultation ? ? ? ? ? ? Cardiovascular ?Exercise Tolerance: Good ?hypertension, Pt. on medications and Pt. on home beta blockers ?Normal cardiovascular exam ?Rhythm:Regular Rate:Normal ? ? ?  ?Neuro/Psych ?PSYCHIATRIC DISORDERS Anxiety Depression negative neurological ROS ?   ? GI/Hepatic ?Neg liver ROS, GERD  Medicated and Poorly Controlled,  ?Endo/Other  ?Hypothyroidism Morbid obesity ? Renal/GU ?negative Renal ROS  ?negative genitourinary ?  ?Musculoskeletal ? ?(+) Arthritis , Osteoarthritis,   ? Abdominal ?  ?Peds ?negative pediatric ROS ?(+) ATTENTION DEFICIT DISORDER WITHOUT HYPERACTIVITY Hematology ?negative hematology ROS ?(+)   ?Anesthesia Other Findings ? ? Reproductive/Obstetrics ?negative OB ROS ? ?  ? ? ? ? ? ? ? ? ? ? ? ? ? ?  ?  ? ? ? ? ? ? ? ?Anesthesia Physical ?Anesthesia Plan ? ?ASA: 3 ? ?Anesthesia Plan: General  ? ?Post-op Pain Management: Dilaudid IV  ? ?Induction: Intravenous, Rapid sequence and Cricoid pressure planned ? ?PONV Risk Score and Plan: 4 or greater and Ondansetron and Dexamethasone ? ?Airway Management Planned: Oral ETT and Video Laryngoscope Planned ? ?Additional Equipment:  ? ?Intra-op Plan:  ? ?Post-operative Plan: Extubation in OR ? ?Informed Consent: I have  reviewed the patients History and Physical, chart, labs and discussed the procedure including the risks, benefits and alternatives for the proposed anesthesia with the patient or authorized representative who has indicated his/her understanding and acceptance.  ? ? ? ?Dental advisory given ? ?Plan Discussed with: CRNA and Surgeon ? ?Anesthesia Plan Comments: (Limited neck movement, bilateral upper extremity numbness, will use glidescope and keep her neck in neutral position.)  ? ? ? ? ? ? ?Anesthesia Quick Evaluation ? ?

## 2022-04-03 NOTE — Transfer of Care (Signed)
Immediate Anesthesia Transfer of Care Note ? ?Patient: Natasha Cain ? ?Procedure(s) Performed: HERNIA REPAIR INCISIONAL W/ MESH (Abdomen) ? ?Patient Location: PACU ? ?Anesthesia Type:General ? ?Level of Consciousness: drowsy ? ?Airway & Oxygen Therapy: Patient Spontanous Breathing and Patient connected to face mask oxygen ? ?Post-op Assessment: Report given to RN and Post -op Vital signs reviewed and stable ? ?Post vital signs: Reviewed and stable ? ?Last Vitals:  ?Vitals Value Taken Time  ?BP    ?Temp    ?Pulse    ?Resp    ?SpO2    ? ? ?Last Pain:  ?Vitals:  ? 04/03/22 0835  ?TempSrc: Oral  ?PainSc: 0-No pain  ?   ? ?Patients Stated Pain Goal: 6 (04/03/22 1610) ? ?Complications: No notable events documented. ?

## 2022-04-03 NOTE — Anesthesia Postprocedure Evaluation (Signed)
Anesthesia Post Note ? ?Patient: Natasha Cain ? ?Procedure(s) Performed: HERNIA REPAIR INCISIONAL W/ MESH (Abdomen) ? ?Patient location during evaluation: Phase II ?Anesthesia Type: General ?Level of consciousness: awake and alert and oriented ?Pain management: pain level controlled ?Vital Signs Assessment: post-procedure vital signs reviewed and stable ?Respiratory status: spontaneous breathing, nonlabored ventilation and respiratory function stable ?Cardiovascular status: blood pressure returned to baseline and stable ?Postop Assessment: no apparent nausea or vomiting ?Anesthetic complications: no ? ? ?No notable events documented. ? ? ?Last Vitals:  ?Vitals:  ? 04/03/22 1100 04/03/22 1116  ?BP: (!) 94/51 107/81  ?Pulse: (!) 48 (!) 55  ?Resp: (!) 9 18  ?Temp:  (!) 36.4 ?C  ?SpO2: 96% 99%  ?  ?Last Pain:  ?Vitals:  ? 04/03/22 1116  ?TempSrc: Oral  ?PainSc: 7   ? ? ?  ?  ?  ?  ?  ?  ? ?Natasha Cain ? ? ? ? ?

## 2022-04-04 ENCOUNTER — Telehealth: Payer: Self-pay | Admitting: *Deleted

## 2022-04-04 ENCOUNTER — Other Ambulatory Visit: Payer: Self-pay | Admitting: General Surgery

## 2022-04-04 ENCOUNTER — Encounter (HOSPITAL_COMMUNITY): Payer: Self-pay | Admitting: General Surgery

## 2022-04-04 DIAGNOSIS — M542 Cervicalgia: Secondary | ICD-10-CM | POA: Diagnosis not present

## 2022-04-04 DIAGNOSIS — G894 Chronic pain syndrome: Secondary | ICD-10-CM | POA: Diagnosis not present

## 2022-04-04 DIAGNOSIS — M797 Fibromyalgia: Secondary | ICD-10-CM | POA: Diagnosis not present

## 2022-04-04 NOTE — Telephone Encounter (Signed)
Received call from patient [phone]~ telephone.  ? ?Surgical Date: 04/03/2022 ?Procedure: Incisional Hernia Repair w/ Mesh ? ?Patient reports increased pain overnight and was not able to find relief. Reports pain at level 8. Requested pain prescription.  ? ?Patient is under care of [facility] at [facility] [place] location. Reports that she was advised that pain management would not give her prescription and to request from [facility].  ? ?Call placed to [facility] [phone]~ telephone. Discussed with Tomika who advised that while patient is under pain management contract, no prescriptions can be given from any provider other than pain management. Patient is currently under the care of Arlan Organ, [place].  ? ?Requested return call from pain management provider. ? ?Call placed to patient and patient made aware. Patient reports that provider is aware and adamantly refused to prescribe pain management.  ? ?Dr. Lovell Sheehan requested patient clarify allergies and precious pain management therapies. Patient states that she is able to take both hydrocodone and oxycodone with no adverse reactions. Reports that allergy is to Darvocet.  ?

## 2022-04-05 ENCOUNTER — Other Ambulatory Visit (INDEPENDENT_AMBULATORY_CARE_PROVIDER_SITE_OTHER): Payer: Medicaid Other | Admitting: General Surgery

## 2022-04-05 DIAGNOSIS — Z09 Encounter for follow-up examination after completed treatment for conditions other than malignant neoplasm: Secondary | ICD-10-CM

## 2022-04-05 LAB — POCT I-STAT, CHEM 8
BUN: 22 mg/dL — ABNORMAL HIGH (ref 6–20)
Calcium, Ion: 1.15 mmol/L (ref 1.15–1.40)
Chloride: 107 mmol/L (ref 98–111)
Creatinine, Ser: 1.2 mg/dL — ABNORMAL HIGH (ref 0.44–1.00)
Glucose, Bld: 125 mg/dL — ABNORMAL HIGH (ref 70–99)
HCT: 36 % (ref 36.0–46.0)
Hemoglobin: 12.2 g/dL (ref 12.0–15.0)
Potassium: 3.3 mmol/L — ABNORMAL LOW (ref 3.5–5.1)
Sodium: 141 mmol/L (ref 135–145)
TCO2: 24 mmol/L (ref 22–32)

## 2022-04-05 MED ORDER — OXYCODONE-ACETAMINOPHEN 7.5-325 MG PO TABS: 1.0000 | ORAL_TABLET | ORAL | 0 refills | Status: DC | PRN

## 2022-04-05 NOTE — Progress Notes (Signed)
Percocet prescribed

## 2022-04-05 NOTE — Telephone Encounter (Signed)
Call placed to [facility] [phone]~ telephone. Discussed with Dorathy Daft who advised that patient is not under pain contract though they are prescribing suboxone. States that provider is panning to call to give authorization for [facility] to prescribe pain medications as needed for patient.  ?

## 2022-04-05 NOTE — Telephone Encounter (Signed)
Received fax from [facility] with authorization for [facility] to provide prescription as deemed necessary.  ? ?Patient continues to report pain levels 7-8. Dr. Lovell Sheehan made aware of request.  ? ?Pharmacy: [facility] [place], [place]  ?

## 2022-04-07 ENCOUNTER — Ambulatory Visit: Payer: Medicaid Other | Admitting: Podiatry

## 2022-04-07 ENCOUNTER — Encounter: Payer: Self-pay | Admitting: Podiatry

## 2022-04-07 DIAGNOSIS — M722 Plantar fascial fibromatosis: Secondary | ICD-10-CM

## 2022-04-07 NOTE — Progress Notes (Signed)
HPI: 46 y.o. female presenting today for follow-up evaluation of right arch pain has been going on for several months now.  Last visit on 02/22/2022 we administered steroid injection as well as meloxicam which was ordered for the patient.  She does have a history of being on a prednisone pack recently as well.  None of the modalities helped and she continues to have pain and tenderness to the arch of the foot.  Also a plantar fascial band was dispensed which did not help.  She continues to have pain.  Presenting for further treatment and evaluation  Past Medical History:  Diagnosis Date   Anxiety    Arthritis    Asthma    Complication of anesthesia    patietn wakes up in panic mode   COPD (chronic obstructive pulmonary disease) ([place])    Depression    Hyperlipidemia    Hypertension    Hypothyroidism    PONV (postoperative nausea and vomiting)    Substance abuse ([place])    Thyroid disease     Past Surgical History:  Procedure Laterality Date   BACK SURGERY     CESAREAN SECTION  05/22/07   CHOLECYSTECTOMY  2009   INCISIONAL HERNIA REPAIR N/A 04/03/2022   Procedure: HERNIA REPAIR INCISIONAL W/ MESH;  Surgeon: Aviva Signs, MD;  Location: [facility];  Service: General;  Laterality: N/A;   KNEE SURGERY  07/2011   left   SPINE SURGERY  05/15/12    Allergies  Allergen Reactions   Cymbalta [Duloxetine Hcl] Anaphylaxis   Latex Rash   Zorvolex [Diclofenac] Swelling    Stroke like symptoms including tingling in arms and tongue in addition to swelling   Ambien [Zolpidem Tartrate] Other (See Comments)    Hallucinations   Codeine Rash   Darvon [Propoxyphene] Rash    Darvocet   Sulfa Antibiotics Hives and Rash     Physical Exam: General: The patient is alert and oriented x3 in no acute distress.  Dermatology: Skin is warm, dry and supple bilateral lower extremities. Negative for open lesions or macerations.  Vascular: Palpable pedal pulses bilaterally. Capillary refill within normal limits.   Negative for any significant edema or erythema  Neurological: Light touch and protective threshold grossly intact  Musculoskeletal Exam: No pedal deformities noted.  There continues to be exquisite tenderness and pain with light palpation and touch along the plantar arch of the right foot  Assessment: 1.  Plantar fasciitis/arch pain right foot   Plan of Care:  1. Patient evaluated.  2.  The patient recently had laparoscopic hernia surgery and she has been resting at home out of work.  3.  Cam boot dispensed.  Weightbearing as tolerated 4.  MRI ordered RT foot.  Unfortunately the patient has not improved with conservative care including steroid injection, oral anti-inflammatories, bracing, and shoe gear modifications. 5.  Return to clinic 4 weeks.  Over the next 3-4 weeks the patient will be resting at home to recover from hernia surgery     Edrick Kins, DPM [facility]  Dr. Edrick Kins, DPM    [address] [place] [address]                Office [phone]  Fax (  [phone]     

## 2022-04-18 ENCOUNTER — Encounter: Payer: Self-pay | Admitting: General Surgery

## 2022-04-18 ENCOUNTER — Ambulatory Visit (INDEPENDENT_AMBULATORY_CARE_PROVIDER_SITE_OTHER): Payer: Medicaid Other | Admitting: General Surgery

## 2022-04-18 VITALS — BP 89/50 | HR 57 | Temp 97.9°F | Resp 14 | Ht 67.0 in | Wt 264.0 lb

## 2022-04-18 DIAGNOSIS — Z09 Encounter for follow-up examination after completed treatment for conditions other than malignant neoplasm: Secondary | ICD-10-CM

## 2022-04-18 NOTE — Progress Notes (Signed)
Subjective:     Natasha Cain  Here for postoperative visit, status post incisional herniorrhaphy with mesh.  Patient has been doing well.  She did have 1 episode of pain and swelling when she was laughing a lot recently.  No further pain since that time.  No significant swelling noted. Objective:    BP (!) 89/50   Pulse (!) 57   Temp 97.9 F (36.6 C) (Other (Comment))   Resp 14   Ht 5\' 7"  (1.702 m)   Wt 264 lb (119.7 kg)   SpO2 98%   BMI 41.35 kg/m   General:  alert, cooperative, and no distress  Abdomen soft, incision well-healed.  No hernia appreciated.  No ecchymosis     Assessment:   Doing well postoperatively.   Plan:   May increase activity as able.  Follow-up here as needed.  There is no contraindication to undergoing foot surgery or MRI.

## 2022-04-19 DIAGNOSIS — F5081 Binge eating disorder: Secondary | ICD-10-CM | POA: Diagnosis not present

## 2022-04-19 DIAGNOSIS — F331 Major depressive disorder, recurrent, moderate: Secondary | ICD-10-CM | POA: Diagnosis not present

## 2022-04-19 DIAGNOSIS — F902 Attention-deficit hyperactivity disorder, combined type: Secondary | ICD-10-CM | POA: Diagnosis not present

## 2022-04-19 DIAGNOSIS — F411 Generalized anxiety disorder: Secondary | ICD-10-CM | POA: Diagnosis not present

## 2022-04-19 DIAGNOSIS — F4310 Post-traumatic stress disorder, unspecified: Secondary | ICD-10-CM | POA: Diagnosis not present

## 2022-04-22 ENCOUNTER — Ambulatory Visit
Admission: RE | Admit: 2022-04-22 | Discharge: 2022-04-22 | Disposition: A | Discharge status: Home / Self Care | Payer: Medicaid Other | Source: Ambulatory Visit | Attending: Podiatry | Admitting: Podiatry

## 2022-04-22 DIAGNOSIS — G8929 Other chronic pain: Secondary | ICD-10-CM | POA: Diagnosis not present

## 2022-04-22 DIAGNOSIS — M79671 Pain in right foot: Secondary | ICD-10-CM | POA: Diagnosis not present

## 2022-04-22 DIAGNOSIS — M722 Plantar fascial fibromatosis: Secondary | ICD-10-CM

## 2022-04-24 DIAGNOSIS — M1712 Unilateral primary osteoarthritis, left knee: Secondary | ICD-10-CM | POA: Diagnosis not present

## 2022-05-02 DIAGNOSIS — Z79891 Long term (current) use of opiate analgesic: Secondary | ICD-10-CM | POA: Diagnosis not present

## 2022-05-02 DIAGNOSIS — Z79899 Other long term (current) drug therapy: Secondary | ICD-10-CM | POA: Diagnosis not present

## 2022-05-12 ENCOUNTER — Ambulatory Visit: Payer: Medicaid Other | Admitting: Podiatry

## 2022-05-31 ENCOUNTER — Ambulatory Visit (INDEPENDENT_AMBULATORY_CARE_PROVIDER_SITE_OTHER): Payer: Medicaid Other | Admitting: Podiatry

## 2022-05-31 ENCOUNTER — Ambulatory Visit (INDEPENDENT_AMBULATORY_CARE_PROVIDER_SITE_OTHER): Payer: Medicaid Other

## 2022-05-31 DIAGNOSIS — M722 Plantar fascial fibromatosis: Secondary | ICD-10-CM

## 2022-05-31 NOTE — Progress Notes (Signed)
HPI: 46 y.o. female presenting today for follow-up evaluation of right arch pain has been going on for several months now.  Patient states that she continues to not have any relief.  All the conservative modalities have not been successful.  Last visit an MRI was ordered.  She has not been fitted for orthotics however yet.  She says that injections oral anti-inflammatories and bracing modalities have not helped alleviate any of her symptoms.  She presents for further treatment and evaluation and to review the MRI results  Past Medical History:  Diagnosis Date   Anxiety    Arthritis    Asthma    Complication of anesthesia    patietn wakes up in panic mode   COPD (chronic obstructive pulmonary disease) ([facility])    Depression    Hyperlipidemia    Hypertension    Hypothyroidism    PONV (postoperative nausea and vomiting)    Substance abuse ([facility])    Thyroid disease     Past Surgical History:  Procedure Laterality Date   BACK SURGERY     CESAREAN SECTION  05/22/07   CHOLECYSTECTOMY  2009   INCISIONAL HERNIA REPAIR N/A 04/03/2022   Procedure: HERNIA REPAIR INCISIONAL W/ MESH;  Surgeon: Franky Macho, MD;  Location: [facility];  Service: General;  Laterality: N/A;   KNEE SURGERY  07/2011   left   SPINE SURGERY  05/15/12    Allergies  Allergen Reactions   Cymbalta [Duloxetine Hcl] Anaphylaxis   Latex Rash   Zorvolex [Diclofenac] Swelling    Stroke like symptoms including tingling in arms and tongue in addition to swelling   Ambien [Zolpidem Tartrate] Other (See Comments)    Hallucinations   Codeine Rash   Darvon [Propoxyphene] Rash    Darvocet   Sulfa Antibiotics Hives and Rash     Physical Exam: General: The patient is alert and oriented x3 in no acute distress.  Dermatology: Skin is warm, dry and supple bilateral lower extremities. Negative for open lesions or macerations.  Vascular: Palpable pedal pulses bilaterally. Capillary refill within normal limits.  Negative for any  significant edema or erythema  Neurological: Light touch and protective threshold grossly intact  Musculoskeletal Exam: No pedal deformities noted.  There continues to be exquisite tenderness and pain with light palpation and touch along the plantar arch of the right foot  MR HEEL RT WO CONTRAST 04/22/2022: IMPRESSION: 1. 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 2. Mild osteoarthritis the second, third and fourth tarsometatarsal joints.  Assessment: 1.  Plantar fasciitis/arch pain right foot   Plan of Care:  1. Patient evaluated.  MRI reviewed which was mostly noncontributory to the patient's heel pain.  Incidental findings of split tear of the peroneus longus and arthritis of the second third and fourth TMT joints.  These areas are nonsymptomatic 2.  Patient continues to have pain.  She actually discontinued the meloxicam because it was not helping. 3.  Appointment for custom molded orthotics.  Apparently last visit the patient did not make an appointment for custom orthotics.  I am hoping that orthotics can provide lasting relief and alleviate some of the patient's symptoms 4.  Return to clinic 6 weeks after orthotics   Felecia Shelling, DPM [facility]  Dr. Felecia Shelling, DPM    [address]  [place], [place] [address]                Office [phone]  Fax [phone]

## 2022-05-31 NOTE — Progress Notes (Signed)
Reason for Consult: Evaluation for Bilateral Custom Foot Orthoses  ACTIONS PERFORMED Potential out of pocket cost was communicated to patient. Patient understood and consent to casting. Patient was casted for Foot Orthoses via crush box. Casts were shipped to central fabrication. All questions were answered and concerns addressed.   PLAN Patient is to be scheduled for fitting in 6 weeks to pick up inserts.  

## 2022-06-30 ENCOUNTER — Ambulatory Visit (INDEPENDENT_AMBULATORY_CARE_PROVIDER_SITE_OTHER): Payer: Medicaid Other | Admitting: Podiatry

## 2022-06-30 DIAGNOSIS — T148XXA Other injury of unspecified body region, initial encounter: Secondary | ICD-10-CM

## 2022-06-30 MED ORDER — METHYLPREDNISOLONE 4 MG PO TBPK: ORAL_TABLET | ORAL | 0 refills | Status: DC

## 2022-07-04 ENCOUNTER — Telehealth: Payer: Self-pay | Admitting: Podiatry

## 2022-07-04 NOTE — Telephone Encounter (Signed)
left voice mail to call back to schedule to pick up orthotics

## 2022-07-06 NOTE — Progress Notes (Signed)
Subjective:  Patient ID: Natasha Cain, female    DOB: Jun 30, 1976,  MRN: [id number]  Chief Complaint  Patient presents with   Plantar Fasciitis    46 y.o. female presents with the above complaint.  Patient presents with right generalized soft tissue contusion to the foot.  Patient states that the pain got worse with pressure.  Pain is still there when she wears a boot it does feel better with the boot.  She wanted to get it evaluated.  She is known to Dr. Amalia Hailey for [place] fasciitis treatment.  She says the plantar fascia right now is feeling okay.  She is states that there is a lot of inflammation going on the foot.  She denies any other acute complaints she has not seen anyone else prior to seeing me for this particular issue.   Review of Systems: Negative except as noted in the HPI. Denies N/V/F/Ch.  Past Medical History:  Diagnosis Date   Anxiety    Arthritis    Asthma    Complication of anesthesia    patietn wakes up in panic mode   COPD (chronic obstructive pulmonary disease) ([facility])    Depression    Hyperlipidemia    Hypertension    Hypothyroidism    PONV (postoperative nausea and vomiting)    Substance abuse ([facility])    Thyroid disease     Current Outpatient Medications:    methylPREDNISolone (MEDROL DOSEPAK) 4 MG TBPK tablet, Take as directed, Disp: 21 each, Rfl: 0   albuterol (VENTOLIN HFA) 108 (90 Base) MCG/ACT inhaler, Inhale 2 puffs into the lungs every 6 (six) hours as needed for wheezing or shortness of breath., Disp: , Rfl:    amphetamine-dextroamphetamine (ADDERALL) 20 MG tablet, Take 20 mg by mouth daily in the afternoon., Disp: , Rfl:    ARIPiprazole (ABILIFY) 5 MG tablet, Take 5 mg by mouth daily., Disp: , Rfl:    aspirin EC 81 MG tablet, Take 81 mg by mouth daily. Swallow whole., Disp: , Rfl:    atenolol (TENORMIN) 50 MG tablet, Take 50 mg by mouth daily., Disp: , Rfl:    chlorthalidone (HYGROTON) 25 MG tablet, Take 25 mg by mouth daily., Disp: , Rfl:     cholecalciferol (VITAMIN D3) 25 MCG (1000 UNIT) tablet, Take 3,000 Units by mouth daily., Disp: , Rfl:    cloNIDine (CATAPRES) 0.3 MG tablet, Take 0.3 mg by mouth at bedtime., Disp: , Rfl:    Coenzyme Q10 (CO Q-10) 50 MG CAPS, Take 50 mg by mouth daily., Disp: , Rfl:    Fluticasone-Umeclidin-Vilant (TRELEGY ELLIPTA) 100-62.5-25 MCG/ACT AEPB, Inhale 1 puff into the lungs daily., Disp: , Rfl:    furosemide (LASIX) 20 MG tablet, Take 20 mg by mouth daily as needed for edema., Disp: , Rfl:    gabapentin (NEURONTIN) 400 MG capsule, Take 400 mg by mouth in the morning, at noon, in the evening, and at bedtime., Disp: , Rfl:    hydrOXYzine (VISTARIL) 100 MG capsule, Take 100 mg by mouth at bedtime., Disp: , Rfl:    ibuprofen (ADVIL) 200 MG tablet, Take 800 mg by mouth every 8 (eight) hours as needed for mild pain or moderate pain., Disp: , Rfl:    levothyroxine (SYNTHROID) 125 MCG tablet, Take 125 mcg by mouth daily before breakfast., Disp: , Rfl:    meloxicam (MOBIC) 15 MG tablet, Take 1 tablet (15 mg total) by mouth daily., Disp: 30 tablet, Rfl: 1   Multiple Vitamins-Minerals (MULTIVITAMIN WITH MINERALS) tablet, Take 1 tablet  by mouth daily., Disp: , Rfl:    nystatin (MYCOSTATIN/NYSTOP) powder, Apply 1 application. topically daily., Disp: , Rfl:    Omega-3 Fatty Acids (FISH OIL) 1000 MG CAPS, Take 1,000 mg by mouth daily., Disp: , Rfl:    omeprazole (PRILOSEC) 40 MG capsule, Take 80 mg by mouth every morning., Disp: , Rfl:    oxyCODONE-acetaminophen (PERCOCET) 7.5-325 MG tablet, Take 1 tablet by mouth every 4 (four) hours as needed for severe pain., Disp: 30 tablet, Rfl: 0   Potassium 99 MG TABS, Take 198 mg by mouth daily., Disp: , Rfl:    potassium chloride SA (KLOR-CON M) 20 MEQ tablet, Take 1 tablet (20 mEq total) by mouth 3 (three) times daily., Disp: 15 tablet, Rfl: 0   rOPINIRole (REQUIP) 1 MG tablet, Take 1 mg by mouth at bedtime., Disp: , Rfl:    sertraline (ZOLOFT) 100 MG tablet, Take 200 mg by  mouth at bedtime., Disp: , Rfl:    tiZANidine (ZANAFLEX) 4 MG tablet, Take 4 mg by mouth every 8 (eight) hours as needed for muscle spasms., Disp: , Rfl:    topiramate (TOPAMAX) 100 MG tablet, Take 100 mg by mouth at bedtime., Disp: , Rfl:    VYVANSE 70 MG capsule, Take 70 mg by mouth every morning., Disp: , Rfl:    zinc gluconate 50 MG tablet, Take 50 mg by mouth at bedtime., Disp: , Rfl:   Social History   Tobacco Use  Smoking Status Every Day   Packs/day: 0.50   Types: Cigarettes  Smokeless Tobacco Not on file    Allergies  Allergen Reactions   Cymbalta [Duloxetine Hcl] Anaphylaxis   Latex Rash   Zorvolex [Diclofenac] Swelling    Stroke like symptoms including tingling in arms and tongue in addition to swelling   Ambien [Zolpidem Tartrate] Other (See Comments)    Hallucinations   Codeine Rash   Darvon [Propoxyphene] Rash    Darvocet   Sulfa Antibiotics Hives and Rash   Objective:  There were no vitals filed for this visit. There is no height or weight on file to calculate BMI. Constitutional Well developed. Well nourished.  Vascular Dorsalis pedis pulses palpable bilaterally. Posterior tibial pulses palpable bilaterally. Capillary refill normal to all digits.  No cyanosis or clubbing noted. Pedal hair growth normal.  Neurologic Normal speech. Oriented to person, place, and time. Epicritic sensation to light touch grossly present bilaterally.  Dermatologic Nails well groomed and normal in appearance. No open wounds. No skin lesions.  Orthopedic: Generalized foot pain noted across the dorsum of the foot at the midfoot carrying onto the toes.  Some pain with resisted dorsiflexion of the digits no pain with resisted plantarflexion of the digits.  No pain at the plantar fascia/calcaneal tuber.  No pain at the Achilles tendon posterior tibial tendon peroneal tendon.   Radiographs: None Assessment:   1. Contusion of soft tissue    Plan:  Patient was evaluated and  treated and all questions answered.  Right generalized soft tissue contusion -All questions and concerns were discussed with the patient in extensive detail -Given the amount of pain that she is experiencing she will generous but she will benefit from continue using the cam boot to immobilize her foot completely I discussed with her to take 4 to 6 weeks for to calm down.  She will also benefit from Medrol dose pack to bring down the inflammation.  I discussed with patient she states understanding -Medrol Dosepak was dispensed  Return in about  4 weeks (around 07/28/2022).

## 2022-07-07 ENCOUNTER — Telehealth: Payer: Self-pay | Admitting: Podiatry

## 2022-07-07 ENCOUNTER — Other Ambulatory Visit: Payer: Self-pay | Admitting: Podiatry

## 2022-07-07 MED ORDER — MELOXICAM 15 MG PO TABS: 15.0000 mg | ORAL_TABLET | Freq: Every day | ORAL | 1 refills | Status: DC

## 2022-07-07 NOTE — Telephone Encounter (Signed)
Refill prescription for meloxicam 15 mg sent to the pharmacy.  Take daily.  Please notify patient.  Thanks, Dr. Logan Bores

## 2022-07-07 NOTE — Telephone Encounter (Signed)
She can take OTC ibuprofen 800 mg 3 times daily as needed.  Please notify patient.  Thanks, Dr.

## 2022-07-07 NOTE — Telephone Encounter (Signed)
Pt called back and states that she is allergic to meloxicam and this should be noted in her chart. Pt wants to know what else can she do for the pain?  [facility] - [place], [place] - [address]  [address] [place], [place] [address]    Please advise.

## 2022-07-07 NOTE — Telephone Encounter (Signed)
Pt called in stating shes in a lot of pain and wanting a refill on Rx.

## 2022-07-08 ENCOUNTER — Encounter (HOSPITAL_COMMUNITY): Payer: Self-pay

## 2022-07-08 ENCOUNTER — Other Ambulatory Visit: Payer: Self-pay

## 2022-07-08 ENCOUNTER — Emergency Department (HOSPITAL_COMMUNITY)
Admission: EM | Admit: 2022-07-08 | Discharge: 2022-07-08 | Disposition: A | Discharge status: Home / Self Care | Payer: Medicaid Other | Attending: Emergency Medicine | Admitting: Emergency Medicine

## 2022-07-08 DIAGNOSIS — M5441 Lumbago with sciatica, right side: Secondary | ICD-10-CM | POA: Diagnosis not present

## 2022-07-08 DIAGNOSIS — Z91040 Latex allergy status: Secondary | ICD-10-CM | POA: Insufficient documentation

## 2022-07-08 DIAGNOSIS — M79671 Pain in right foot: Secondary | ICD-10-CM

## 2022-07-08 DIAGNOSIS — Z7982 Long term (current) use of aspirin: Secondary | ICD-10-CM | POA: Insufficient documentation

## 2022-07-08 DIAGNOSIS — M5430 Sciatica, unspecified side: Secondary | ICD-10-CM

## 2022-07-08 HISTORY — DX: Type 2 diabetes mellitus without complications: E11.9

## 2022-07-08 MED ORDER — LIDOCAINE 5 % EX PTCH
1.0000 | MEDICATED_PATCH | CUTANEOUS | Status: DC
  Administered 2022-07-08: 1 via TRANSDERMAL
  Filled 2022-07-08: qty 1

## 2022-07-08 MED ORDER — PREDNISONE 20 MG PO TABS: 40.0000 mg | ORAL_TABLET | Freq: Every day | ORAL | 0 refills | Status: DC

## 2022-07-08 MED ORDER — LIDOCAINE 5 % EX PTCH: 1.0000 | MEDICATED_PATCH | CUTANEOUS | 0 refills | Status: AC

## 2022-07-08 MED ORDER — KETOROLAC TROMETHAMINE 15 MG/ML IJ SOLN
15.0000 mg | Freq: Once | INTRAMUSCULAR | Status: AC
Start: 2022-07-08 — End: 2022-07-08
  Administered 2022-07-08: 15 mg via INTRAMUSCULAR
  Filled 2022-07-08: qty 1

## 2022-07-08 NOTE — ED Triage Notes (Signed)
Chronic left knee pain and acute right foot pain due to torn tendons. Being followed by ortho. 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

## 2022-07-08 NOTE — ED Provider Notes (Signed)
[facility] EMERGENCY DEPARTMENT Provider Note   CSN: [id number] Arrival date & time: 07/08/22  1406     History Chief Complaint  Patient presents with   Leg Pain    Natasha Cain is a 46 y.o. female patient with left chronic knee pain and chronic plantar fasciitis who presents to the emergency department with a 4-week history of foot pain.  She describes this foot pain as a sharp shooting sensation that radiates up the entire back of the leg and into her back.  Patient was seen and evaluated at the podiatrist on 2 separate occasions.  She had an MRI which did not show any acute findings apart from a incidental tendon tear which is not contributing to her pain.  She was fitted for orthotic shoes and told to follow-up in 4 to 6 weeks.  She was given a Medrol Dosepak which offered some improvement but the pain returned 2 days later.  This pain is episodic and primarily worse when she plantar flexes the foot.  The pain is localized in the foot between the fourth and fifth toes.  Patient does take gabapentin and methadone daily.   Leg Pain      Home Medications Prior to Admission medications   Medication Sig Start Date End Date Taking? Authorizing Provider  lidocaine (LIDODERM) 5 % Place 1 patch onto the skin daily. Remove & Discard patch within 12 hours or as directed by MD 07/08/22  Yes Meredeth Ide, Emilliano Dilworth M, PA-C  predniSONE (DELTASONE) 20 MG tablet Take 2 tablets (40 mg total) by mouth daily. 07/08/22  Yes Meredeth Ide, Sherol Sabas M, PA-C  albuterol (VENTOLIN HFA) 108 (90 Base) MCG/ACT inhaler Inhale 2 puffs into the lungs every 6 (six) hours as needed for wheezing or shortness of breath.    [provider]  amphetamine-dextroamphetamine (ADDERALL) 20 MG tablet Take 20 mg by mouth daily in the afternoon. 10/25/21   [provider]  ARIPiprazole (ABILIFY) 5 MG tablet Take 5 mg by mouth daily.    [provider]  aspirin EC 81 MG tablet Take 81 mg by mouth daily. Swallow whole.     [provider]  atenolol (TENORMIN) 50 MG tablet Take 50 mg by mouth daily. 03/20/22   [provider]  chlorthalidone (HYGROTON) 25 MG tablet Take 25 mg by mouth daily.    [provider]  cholecalciferol (VITAMIN D3) 25 MCG (1000 UNIT) tablet Take 3,000 Units by mouth daily.    [provider]  cloNIDine (CATAPRES) 0.3 MG tablet Take 0.3 mg by mouth at bedtime.    [provider]  Coenzyme Q10 (CO Q-10) 50 MG CAPS Take 50 mg by mouth daily.    [provider]  Fluticasone-Umeclidin-Vilant (TRELEGY ELLIPTA) 100-62.5-25 MCG/ACT AEPB Inhale 1 puff into the lungs daily.    [provider]  furosemide (LASIX) 20 MG tablet Take 20 mg by mouth daily as needed for edema.    [provider]  gabapentin (NEURONTIN) 400 MG capsule Take 400 mg by mouth in the morning, at noon, in the evening, and at bedtime. 11/30/20   [provider]  hydrOXYzine (VISTARIL) 100 MG capsule Take 100 mg by mouth at bedtime. 10/25/21   [provider]  ibuprofen (ADVIL) 200 MG tablet Take 800 mg by mouth every 8 (eight) hours as needed for mild pain or moderate pain.    [provider]  levothyroxine (SYNTHROID) 125 MCG tablet Take 125 mcg by mouth daily before breakfast. 10/21/20  [provider]  meloxicam (MOBIC) 15 MG tablet Take 1 tablet (15 mg total) by mouth daily. 07/07/22   Edrick Kins, DPM  methylPREDNISolone (MEDROL DOSEPAK) 4 MG TBPK tablet Take as directed 06/30/22   Felipa Furnace, DPM  Multiple Vitamins-Minerals (MULTIVITAMIN WITH MINERALS) tablet Take 1 tablet by mouth daily.    [provider]  nystatin (MYCOSTATIN/NYSTOP) powder Apply 1 application. topically daily. 12/20/21   [provider]  Omega-3 Fatty Acids (FISH OIL) 1000 MG CAPS Take 1,000 mg by mouth daily.    [provider]  omeprazole (PRILOSEC) 40 MG capsule Take 80 mg by mouth every morning.    [provider]  oxyCODONE-acetaminophen (PERCOCET) 7.5-325 MG tablet Take 1 tablet by mouth every 4 (four) hours as needed for severe pain. 04/05/22   Aviva Signs, MD  Potassium 99 MG TABS Take 198 mg by mouth daily.    [provider]  potassium chloride SA (KLOR-CON M) 20 MEQ tablet Take 1 tablet (20 mEq total) by mouth 3 (three) times daily. 03/30/22   Aviva Signs, MD  rOPINIRole (REQUIP) 1 MG tablet Take 1 mg by mouth at bedtime.    [provider]  sertraline (ZOLOFT) 100 MG tablet Take 200 mg by mouth at bedtime. 05/31/16   [provider]  tiZANidine (ZANAFLEX) 4 MG tablet Take 4 mg by mouth every 8 (eight) hours as needed for muscle spasms.    [provider]  topiramate (TOPAMAX) 100 MG tablet Take 100 mg by mouth at bedtime. 10/29/20   [provider]  VYVANSE 70 MG capsule Take 70 mg by mouth every morning. 10/25/21   [provider]  zinc gluconate 50 MG tablet Take 50 mg by mouth at bedtime.    [provider]      Allergies    Cymbalta [duloxetine hcl], Latex, Zorvolex [diclofenac], Ambien [zolpidem tartrate], Codeine, Darvon [propoxyphene], Mobic [meloxicam], and Sulfa antibiotics    Review of Systems   Review of Systems  All other systems reviewed and are negative.   Physical Exam Updated Vital Signs BP 112/63   Pulse 61   Temp 97.9 F (36.6 C)   Resp 18   Ht 5\' 5"  (1.651 m)   Wt 114.3 kg   SpO2 99%   BMI 41.93 kg/m  Physical Exam Vitals and nursing note reviewed.  Constitutional:      Appearance: Normal appearance.  HENT:     Head: Normocephalic and atraumatic.  Eyes:     General:        Right eye: No discharge.        Left eye: No discharge.     Conjunctiva/sclera: Conjunctivae normal.  Pulmonary:     Effort: Pulmonary effort is normal.  Musculoskeletal:     Comments: There is point tenderness over the right paralumbar musculature which reproduces the patient's shooting pain down to the  foot.  Normal sensation in lower extremities.  Good equal strength.  Skin:    General: Skin is warm and dry.     Findings: No rash.  Neurological:     General: No focal deficit present.     Mental Status: She is alert.  Psychiatric:        Mood and Affect: Mood normal.        Behavior: Behavior normal.     ED Results / Procedures / Treatments   Labs (all labs ordered are listed, but only abnormal results are displayed) Labs Reviewed - No  data to display  EKG None  Radiology No results found.  Procedures Procedures    Medications Ordered in ED Medications  lidocaine (LIDODERM) 5 % 1 patch (1 patch Transdermal Patch Applied 07/08/22 1540)  ketorolac (TORADOL) 15 MG/ML injection 15 mg (15 mg Intramuscular Given 07/08/22 1543)    ED Course/ Medical Decision Making/ A&P                           Medical Decision Making Natasha Cain is a 46 y.o. female patient who presents to the emergency department today for further evaluation of chronic foot pain.  I do not feel that the pain is coming from her foot today.  She has reproducible pain with palpation of the right paralumbar musculature and her pain follows the distribution of the sciatic nerve.  I will try giving her a lidocaine patch and Toradol.  Toradol is listed as an allergy but I double checked with the patient and she does not recall ever being allergic to Toradol.  I am unable to give her narcotic pain medication here as she is on methadone daily.  I will plan to give her a prednisone burst and lidocaine patches if her pain improves with lidocaine patch.  I will plan to reassess here shortly.  Patient was able to walk to the bathroom with assistance.  She states that she is feeling much better after lidocaine patch and shot of Toradol.  I will send her home with lidocaine patches and a short burst of steroids.  She will follow-up with her podiatrist.  I have also provided her a neurosurgeon number she can contact for  follow-up as well.  She is safe for discharge at this time.  Risk Prescription drug management.   Final Clinical Impression(s) / ED Diagnoses Final diagnoses:  Sciatic leg pain  Right foot pain    Rx / DC Orders ED Discharge Orders          Ordered    predniSONE (DELTASONE) 20 MG tablet  Daily        07/08/22 1646    lidocaine (LIDODERM) 5 %  Every 24 hours        07/08/22 1646              Honor Loh [place], PA-C 07/08/22 1649    Gerhard Munch, MD 07/09/22 1429

## 2022-07-08 NOTE — Discharge Instructions (Signed)
Use lidocaine patches as prescribed.  You may also take your prednisone as prescribed.  Please follow-up with your foot doctor or your spinal doctor.  I have provided a neurosurgeon for you as well.  Please return to the emergency department for any worsening symptoms you might have.

## 2022-07-17 ENCOUNTER — Telehealth: Payer: Self-pay | Admitting: Podiatry

## 2022-07-17 NOTE — Telephone Encounter (Signed)
Pt returned call to get scheduled to pick up her orthotics, she received a message [phone].Marland Kitchen She asked if insurance covered them and upon checking she has medicaid and I explained that medicaid does not cover dme and she asked the cost and I told her 438.00 and she must pay half up front.

## 2022-08-23 ENCOUNTER — Ambulatory Visit: Payer: Medicaid Other | Admitting: Podiatry

## 2022-09-04 ENCOUNTER — Ambulatory Visit: Payer: Medicaid Other | Admitting: Podiatry

## 2022-09-26 ENCOUNTER — Telehealth: Payer: Self-pay | Admitting: *Deleted

## 2022-09-26 NOTE — Telephone Encounter (Signed)
Confirmation received.

## 2022-09-26 NOTE — Telephone Encounter (Signed)
Received fax from [place] [place], [place]- [place] fax.   Electronically signed HIPPA Compliant Authorization for Release of Medical Information enclosed with date of 06/22/2022.  Medical records requested: 06/19/2021- present   Medical Record Request Case Number: [id number]  Records from [facility] faxed.

## 2022-10-31 ENCOUNTER — Ambulatory Visit (INDEPENDENT_AMBULATORY_CARE_PROVIDER_SITE_OTHER): Payer: Self-pay | Admitting: Podiatry

## 2022-10-31 DIAGNOSIS — M722 Plantar fascial fibromatosis: Secondary | ICD-10-CM

## 2022-10-31 DIAGNOSIS — T148XXA Other injury of unspecified body region, initial encounter: Secondary | ICD-10-CM

## 2022-10-31 NOTE — Progress Notes (Signed)
Patient presents today to pick up custom molded foot orthotics recommended by Dr. Logan Bores.   Orthotics were dispensed and fit was satisfactory. Reviewed instructions for break-in and wear. Written instructions given to patient.  Patient will follow up as needed.   [facility] - order # [id number]

## 2022-11-08 NOTE — H&P (Signed)
PREOPERATIVE H&P  Chief Complaint: LEFT 5TH METATARSAL FRACTURE  HPI: Natasha Cain is a 46 y.o. female who presents with a diagnosis of LEFT 5TH METATARSAL FRACTURE. Symptoms are rated as moderate to severe, and have been worsening.  This is significantly impairing activities of daily living.  She has elected for surgical management.   Past Medical History:  Diagnosis Date   Anxiety    Arthritis    Asthma    Complication of anesthesia    patietn wakes up in panic mode   COPD (chronic obstructive pulmonary disease) (HCC)    Depression    Diabetes (HCC)    Hyperlipidemia    Hypertension    Hypothyroidism    PONV (postoperative nausea and vomiting)    Substance abuse (HCC)    Thyroid disease    Past Surgical History:  Procedure Laterality Date   BACK SURGERY     CESAREAN SECTION  05/22/07   CHOLECYSTECTOMY  2009   INCISIONAL HERNIA REPAIR N/A 04/03/2022   Procedure: HERNIA REPAIR INCISIONAL W/ MESH;  Surgeon: Franky Macho, MD;  Location: [facility];  Service: General;  Laterality: N/A;   KNEE SURGERY  07/2011   left   SPINE SURGERY  05/15/12   Social History   Socioeconomic History   Marital status: Married    Spouse name: Not on file   Number of children: Not on file   Years of education: Not on file   Highest education level: Not on file  Occupational History   Not on file  Tobacco Use   Smoking status: Every Day    Packs/day: 0.50    Types: Cigarettes   Smokeless tobacco: Not on file  Substance and Sexual Activity   Alcohol use: No   Drug use: No   Sexual activity: Never    Birth control/protection: None  Other Topics Concern   Not on file  Social History Narrative   Not on file   Social Determinants of Health   Financial Resource Strain: Not on file  Food Insecurity: Not on file  Transportation Needs: Not on file  Physical Activity: Not on file  Stress: Not on file  Social Connections: Not on file   Family History  Problem Relation Age of Onset    Cancer Mother        breast   Cancer Maternal Aunt        skin cancer on face   Cancer Paternal Uncle        colon   Cancer Maternal Grandfather        lung   Cancer Paternal Grandfather        colon   Cancer Cousin        colon   Allergies  Allergen Reactions   Cymbalta [Duloxetine Hcl] Anaphylaxis   Latex Rash   Zorvolex [Diclofenac] Swelling    Stroke like symptoms including tingling in arms and tongue in addition to swelling   Ambien [Zolpidem Tartrate] Other (See Comments)    Hallucinations   Codeine Rash   Darvon [Propoxyphene] Rash    Darvocet   Mobic [Meloxicam] Other (See Comments)    Numbness in arms   Sulfa Antibiotics Hives and Rash   Prior to Admission medications   Medication Sig Start Date End Date Taking? Authorizing Provider  albuterol (VENTOLIN HFA) 108 (90 Base) MCG/ACT inhaler Inhale 2 puffs into the lungs every 6 (six) hours as needed for wheezing or shortness of breath.    [provider]  amphetamine-dextroamphetamine (ADDERALL)  20 MG tablet Take 20 mg by mouth daily in the afternoon. 10/25/21   [provider]  ARIPiprazole (ABILIFY) 5 MG tablet Take 5 mg by mouth daily.    [provider]  aspirin EC 81 MG tablet Take 81 mg by mouth daily. Swallow whole.    [provider]  atenolol (TENORMIN) 50 MG tablet Take 50 mg by mouth daily. 03/20/22   [provider]  chlorthalidone (HYGROTON) 25 MG tablet Take 25 mg by mouth daily.    [provider]  cholecalciferol (VITAMIN D3) 25 MCG (1000 UNIT) tablet Take 3,000 Units by mouth daily.    [provider]  cloNIDine (CATAPRES) 0.3 MG tablet Take 0.3 mg by mouth at bedtime.    [provider]  Coenzyme Q10 (CO Q-10) 50 MG CAPS Take 50 mg by mouth daily.    [provider]  Fluticasone-Umeclidin-Vilant (TRELEGY ELLIPTA) 100-62.5-25 MCG/ACT AEPB Inhale 1 puff into the lungs daily.    [provider]  furosemide (LASIX) 20 MG  tablet Take 20 mg by mouth daily as needed for edema.    [provider]  gabapentin (NEURONTIN) 400 MG capsule Take 400 mg by mouth in the morning, at noon, in the evening, and at bedtime. 11/30/20   [provider]  hydrOXYzine (VISTARIL) 100 MG capsule Take 100 mg by mouth at bedtime. 10/25/21   [provider]  ibuprofen (ADVIL) 200 MG tablet Take 800 mg by mouth every 8 (eight) hours as needed for mild pain or moderate pain.    [provider]  levothyroxine (SYNTHROID) 125 MCG tablet Take 125 mcg by mouth daily before breakfast. 10/21/20   [provider]  lidocaine (LIDODERM) 5 % Place 1 patch onto the skin daily. Remove & Discard patch within 12 hours or as directed by MD 07/08/22   Teressa Lower, PA-C  meloxicam (MOBIC) 15 MG tablet Take 1 tablet (15 mg total) by mouth daily. 07/07/22   Felecia Shelling, DPM  methylPREDNISolone (MEDROL DOSEPAK) 4 MG TBPK tablet Take as directed 06/30/22   Candelaria Stagers, DPM  Multiple Vitamins-Minerals (MULTIVITAMIN WITH MINERALS) tablet Take 1 tablet by mouth daily.    [provider]  nystatin (MYCOSTATIN/NYSTOP) powder Apply 1 application. topically daily. 12/20/21   [provider]  Omega-3 Fatty Acids (FISH OIL) 1000 MG CAPS Take 1,000 mg by mouth daily.    [provider]  omeprazole (PRILOSEC) 40 MG capsule Take 80 mg by mouth every morning.    [provider]  oxyCODONE-acetaminophen (PERCOCET) 7.5-325 MG tablet Take 1 tablet by mouth every 4 (four) hours as needed for severe pain. 04/05/22   Franky Macho, MD  Potassium 99 MG TABS Take 198 mg by mouth daily.    [provider]  potassium chloride SA (KLOR-CON M) 20 MEQ tablet Take 1 tablet (20 mEq total) by mouth 3 (three) times daily. 03/30/22   Franky Macho, MD  predniSONE (DELTASONE) 20 MG tablet Take 2 tablets (40 mg total) by mouth daily. 07/08/22   Honor Loh M, PA-C  rOPINIRole (REQUIP) 1 MG tablet Take 1  mg by mouth at bedtime.    [provider]  sertraline (ZOLOFT) 100 MG tablet Take 200 mg by mouth at bedtime. 05/31/16   [provider]  tiZANidine (ZANAFLEX) 4 MG tablet Take 4 mg by mouth every 8 (eight) hours as needed for muscle spasms.    [provider]  topiramate (TOPAMAX) 100 MG tablet Take  100 mg by mouth at bedtime. 10/29/20   [provider]  VYVANSE 70 MG capsule Take 70 mg by mouth every morning. 10/25/21   [provider]  zinc gluconate 50 MG tablet Take 50 mg by mouth at bedtime.    [provider]     Positive ROS: All other systems have been reviewed and were otherwise negative with the exception of those mentioned in the HPI and as above.  Physical Exam: General: Alert, no acute distress Cardiovascular: No pedal edema Respiratory: No cyanosis, no use of accessory musculature GI: No organomegaly, abdomen is soft and non-tender Skin: No lesions in the area of chief complaint Neurologic: Sensation intact distally Psychiatric: Patient is competent for consent with normal mood and affect Lymphatic: No axillary or cervical lymphadenopathy  MUSCULOSKELETAL: TTP left midfoot and 5th metatarsal, decrease ankle and foot ROM d/t pain, no edema or ecchymosis seen, NVI   Imaging: 3v xrays of the left foot show a nondisplaced fracture involving the proximal 5th metatarsal   Assessment: LEFT 5TH METATARSAL FRACTURE  Plan: Plan for Procedure(s): OPEN REDUCTION INTERNAL FIXATION (ORIF) METATARSAL (TOE) FRACTURE  The risks benefits and alternatives were discussed with the patient including but not limited to the risks of nonoperative treatment, versus surgical intervention including infection, bleeding, nerve injury,  blood clots, cardiopulmonary complications, morbidity, mortality, among others, and they were willing to proceed.   Weightbearing: NWB LLE Orthopedic devices: splint Showering: keep splint dry Dressing:  reinforce PRN Medicines: ASA, Oxy, Tylenol, Zofran  Discharge: home Follow up: 11/24/22 at 11:30am    Jenne Pane, PA-C Office [phone] 11/08/2022 2:22 PM

## 2022-11-09 ENCOUNTER — Encounter (HOSPITAL_BASED_OUTPATIENT_CLINIC_OR_DEPARTMENT_OTHER): Payer: Self-pay | Admitting: Orthopedic Surgery

## 2022-11-09 NOTE — Progress Notes (Signed)
Spoke w/ via phone for pre-op interview--- Natasha [facility] Lab needs dos----  UPT and ISTAT             Lab results------Current EKG in Epic dated 11/2021 COVID test -----patient states asymptomatic no test needed Arrive at -------0630 NPO after MN NO Solid Food.  Med rec completed Medications to take morning of surgery ----- Albuterol-bring, Atenolol, Abilify, Gabapentin, Synthroid, Prilosec, Methadone. Diabetic medication ----- NONE AM of surgery Patient instructed no nail polish to be worn day of surgery Patient instructed to bring photo id and insurance card day of surgery Patient aware to have Driver (ride ) / caregiver  Mother and Father  for 24 hours after surgery  Patient Special Instructions ----- Pre-Op special Istructions ----- Patient verbalized understanding of instructions that were given at this phone interview. Patient denies shortness of breath, chest pain, fever, cough at this phone interview.

## 2022-11-12 NOTE — Anesthesia Preprocedure Evaluation (Signed)
Anesthesia Evaluation  Patient identified by MRN, date of birth, ID band Patient awake    Reviewed: Allergy & Precautions, NPO status , Patient's Chart, lab work & pertinent test results  History of Anesthesia Complications (+) PONV and history of anesthetic complications  Airway Mallampati: II  TM Distance: >3 FB Neck ROM: Full    Dental  (+) Poor Dentition, Dental Advisory Given   Pulmonary asthma , COPD, Current Smoker and Patient abstained from smoking.   Pulmonary exam normal        Cardiovascular hypertension, Pt. on medications and Pt. on home beta blockers Normal cardiovascular exam     Neuro/Psych  PSYCHIATRIC DISORDERS Anxiety Depression    negative neurological ROS     GI/Hepatic negative GI ROS,,,(+)     substance abuse    Endo/Other  diabetesHypothyroidism    Renal/GU negative Renal ROS     Musculoskeletal negative musculoskeletal ROS (+)    Abdominal   Peds  Hematology negative hematology ROS (+)   Anesthesia Other Findings   Reproductive/Obstetrics                             Anesthesia Physical Anesthesia Plan  ASA: 3  Anesthesia Plan:    Post-op Pain Management: Tylenol PO (pre-op)* and Toradol IV (intra-op)*   Induction:   PONV Risk Score and Plan:   Airway Management Planned:   Additional Equipment:   Intra-op Plan:   Post-operative Plan:   Informed Consent: I have reviewed the patients History and Physical, chart, labs and discussed the procedure including the risks, benefits and alternatives for the proposed anesthesia with the patient or authorized representative who has indicated his/her understanding and acceptance.     Dental advisory given  Plan Discussed with: Anesthesiologist and CRNA  Anesthesia Plan Comments:        Anesthesia Quick Evaluation

## 2022-11-14 ENCOUNTER — Encounter (HOSPITAL_BASED_OUTPATIENT_CLINIC_OR_DEPARTMENT_OTHER): Payer: Self-pay | Admitting: Orthopedic Surgery

## 2022-11-14 ENCOUNTER — Ambulatory Visit (HOSPITAL_BASED_OUTPATIENT_CLINIC_OR_DEPARTMENT_OTHER): Payer: Medicaid Other | Admitting: Anesthesiology

## 2022-11-14 ENCOUNTER — Other Ambulatory Visit: Payer: Self-pay

## 2022-11-14 ENCOUNTER — Ambulatory Visit (HOSPITAL_BASED_OUTPATIENT_CLINIC_OR_DEPARTMENT_OTHER): Payer: Medicaid Other

## 2022-11-14 ENCOUNTER — Ambulatory Visit (HOSPITAL_BASED_OUTPATIENT_CLINIC_OR_DEPARTMENT_OTHER)
Admission: RE | Admit: 2022-11-14 | Discharge: 2022-11-14 | Disposition: A | Discharge status: Home / Self Care | Payer: Medicaid Other | Attending: Orthopedic Surgery | Admitting: Orthopedic Surgery

## 2022-11-14 ENCOUNTER — Encounter (HOSPITAL_BASED_OUTPATIENT_CLINIC_OR_DEPARTMENT_OTHER)
Admission: RE | Disposition: A | Discharge status: Home / Self Care | Payer: Self-pay | Source: Home / Self Care | Attending: Orthopedic Surgery

## 2022-11-14 DIAGNOSIS — I10 Essential (primary) hypertension: Secondary | ICD-10-CM

## 2022-11-14 DIAGNOSIS — Z01818 Encounter for other preprocedural examination: Secondary | ICD-10-CM

## 2022-11-14 DIAGNOSIS — F17210 Nicotine dependence, cigarettes, uncomplicated: Secondary | ICD-10-CM | POA: Insufficient documentation

## 2022-11-14 DIAGNOSIS — F419 Anxiety disorder, unspecified: Secondary | ICD-10-CM | POA: Insufficient documentation

## 2022-11-14 DIAGNOSIS — E039 Hypothyroidism, unspecified: Secondary | ICD-10-CM | POA: Insufficient documentation

## 2022-11-14 DIAGNOSIS — X58XXXA Exposure to other specified factors, initial encounter: Secondary | ICD-10-CM | POA: Diagnosis not present

## 2022-11-14 DIAGNOSIS — E119 Type 2 diabetes mellitus without complications: Secondary | ICD-10-CM | POA: Diagnosis not present

## 2022-11-14 DIAGNOSIS — S92352A Displaced fracture of fifth metatarsal bone, left foot, initial encounter for closed fracture: Secondary | ICD-10-CM | POA: Diagnosis present

## 2022-11-14 DIAGNOSIS — F32A Depression, unspecified: Secondary | ICD-10-CM | POA: Diagnosis not present

## 2022-11-14 DIAGNOSIS — J449 Chronic obstructive pulmonary disease, unspecified: Secondary | ICD-10-CM | POA: Diagnosis not present

## 2022-11-14 DIAGNOSIS — J45909 Unspecified asthma, uncomplicated: Secondary | ICD-10-CM | POA: Diagnosis not present

## 2022-11-14 HISTORY — PX: ORIF TOE FRACTURE: SHX5032

## 2022-11-14 LAB — POCT I-STAT, CHEM 8
BUN: 36 mg/dL — ABNORMAL HIGH (ref 6–20)
Calcium, Ion: 1.25 mmol/L (ref 1.15–1.40)
Chloride: 102 mmol/L (ref 98–111)
Creatinine, Ser: 1.9 mg/dL — ABNORMAL HIGH (ref 0.44–1.00)
Glucose, Bld: 85 mg/dL (ref 70–99)
HCT: 37 % (ref 36.0–46.0)
Hemoglobin: 12.6 g/dL (ref 12.0–15.0)
Potassium: 4 mmol/L (ref 3.5–5.1)
Sodium: 139 mmol/L (ref 135–145)
TCO2: 26 mmol/L (ref 22–32)

## 2022-11-14 LAB — GLUCOSE, CAPILLARY: Glucose-Capillary: 97 mg/dL (ref 70–99)

## 2022-11-14 LAB — POCT PREGNANCY, URINE: Preg Test, Ur: NEGATIVE

## 2022-11-14 SURGERY — OPEN REDUCTION INTERNAL FIXATION (ORIF) METATARSAL (TOE) FRACTURE
Anesthesia: Choice | Site: Toe | Laterality: Left

## 2022-11-14 MED ORDER — DEXAMETHASONE SODIUM PHOSPHATE 10 MG/ML IJ SOLN
INTRAMUSCULAR | Status: DC | PRN
  Administered 2022-11-14: 5 mg via INTRAVENOUS

## 2022-11-14 MED ORDER — FENTANYL CITRATE (PF) 100 MCG/2ML IJ SOLN
INTRAMUSCULAR | Status: AC
  Filled 2022-11-14: qty 2

## 2022-11-14 MED ORDER — BUPIVACAINE HCL (PF) 0.5 % IJ SOLN
INTRAMUSCULAR | Status: DC | PRN
  Administered 2022-11-14: 15 mL via PERINEURAL

## 2022-11-14 MED ORDER — SCOPOLAMINE 1 MG/3DAYS TD PT72
MEDICATED_PATCH | TRANSDERMAL | Status: AC
  Filled 2022-11-14: qty 1

## 2022-11-14 MED ORDER — PROPOFOL 10 MG/ML IV BOLUS
INTRAVENOUS | Status: DC | PRN
  Administered 2022-11-14: 200 mg via INTRAVENOUS

## 2022-11-14 MED ORDER — FENTANYL CITRATE (PF) 100 MCG/2ML IJ SOLN
INTRAMUSCULAR | Status: DC | PRN
  Administered 2022-11-14: 50 ug via INTRAVENOUS

## 2022-11-14 MED ORDER — PROMETHAZINE HCL 25 MG/ML IJ SOLN: 6.2500 mg | INTRAMUSCULAR | Status: DC | PRN

## 2022-11-14 MED ORDER — DEXAMETHASONE SODIUM PHOSPHATE 10 MG/ML IJ SOLN: 8.0000 mg | Freq: Once | INTRAMUSCULAR | Status: DC

## 2022-11-14 MED ORDER — 0.9 % SODIUM CHLORIDE (POUR BTL) OPTIME
TOPICAL | Status: DC | PRN
  Administered 2022-11-14: 1000 mL

## 2022-11-14 MED ORDER — ACETAMINOPHEN 500 MG PO TABS
1000.0000 mg | ORAL_TABLET | Freq: Once | ORAL | Status: AC
  Administered 2022-11-14: 1000 mg via ORAL

## 2022-11-14 MED ORDER — PROPOFOL 10 MG/ML IV BOLUS
INTRAVENOUS | Status: AC
  Filled 2022-11-14: qty 20

## 2022-11-14 MED ORDER — MIDAZOLAM HCL 2 MG/2ML IJ SOLN
INTRAMUSCULAR | Status: AC
  Filled 2022-11-14: qty 2

## 2022-11-14 MED ORDER — ONDANSETRON HCL 4 MG/2ML IJ SOLN
INTRAMUSCULAR | Status: DC | PRN
  Administered 2022-11-14: 4 mg via INTRAVENOUS

## 2022-11-14 MED ORDER — MIDAZOLAM HCL 2 MG/2ML IJ SOLN
2.0000 mg | Freq: Once | INTRAMUSCULAR | Status: AC
  Administered 2022-11-14: 2 mg via INTRAVENOUS

## 2022-11-14 MED ORDER — POVIDONE-IODINE 10 % EX SWAB: 2.0000 | Freq: Once | CUTANEOUS | Status: DC

## 2022-11-14 MED ORDER — CEFAZOLIN SODIUM-DEXTROSE 2-4 GM/100ML-% IV SOLN
2.0000 g | INTRAVENOUS | Status: AC
  Administered 2022-11-14: 2 g via INTRAVENOUS

## 2022-11-14 MED ORDER — ARTIFICIAL TEARS OPHTHALMIC OINT
TOPICAL_OINTMENT | OPHTHALMIC | Status: AC
  Filled 2022-11-14: qty 3.5

## 2022-11-14 MED ORDER — EPHEDRINE SULFATE-NACL 50-0.9 MG/10ML-% IV SOSY
PREFILLED_SYRINGE | INTRAVENOUS | Status: DC | PRN
  Administered 2022-11-14: 10 mg via INTRAVENOUS
  Administered 2022-11-14: 5 mg via INTRAVENOUS
  Administered 2022-11-14: 10 mg via INTRAVENOUS

## 2022-11-14 MED ORDER — DEXAMETHASONE SODIUM PHOSPHATE 10 MG/ML IJ SOLN
INTRAMUSCULAR | Status: AC
  Filled 2022-11-14: qty 1

## 2022-11-14 MED ORDER — ASPIRIN 81 MG PO TBEC: 81.0000 mg | DELAYED_RELEASE_TABLET | Freq: Two times a day (BID) | ORAL | 0 refills | Status: DC

## 2022-11-14 MED ORDER — AMISULPRIDE (ANTIEMETIC) 5 MG/2ML IV SOLN: 10.0000 mg | Freq: Once | INTRAVENOUS | Status: DC | PRN

## 2022-11-14 MED ORDER — BUPIVACAINE LIPOSOME 1.3 % IJ SUSP
INTRAMUSCULAR | Status: DC | PRN
  Administered 2022-11-14: 10 mL via PERINEURAL

## 2022-11-14 MED ORDER — ACETAMINOPHEN 500 MG PO TABS
ORAL_TABLET | ORAL | Status: AC
  Filled 2022-11-14: qty 2

## 2022-11-14 MED ORDER — ACETAMINOPHEN 500 MG PO TABS: 1000.0000 mg | ORAL_TABLET | Freq: Once | ORAL | Status: DC

## 2022-11-14 MED ORDER — ONDANSETRON 4 MG PO TBDP: 4.0000 mg | ORAL_TABLET | Freq: Two times a day (BID) | ORAL | 0 refills | Status: DC | PRN

## 2022-11-14 MED ORDER — MIDAZOLAM HCL 5 MG/5ML IJ SOLN
INTRAMUSCULAR | Status: DC | PRN
  Administered 2022-11-14: 1 mg via INTRAVENOUS

## 2022-11-14 MED ORDER — WHITE PETROLATUM EX OINT
TOPICAL_OINTMENT | CUTANEOUS | Status: AC
  Filled 2022-11-14: qty 5

## 2022-11-14 MED ORDER — SCOPOLAMINE 1 MG/3DAYS TD PT72
1.0000 | MEDICATED_PATCH | TRANSDERMAL | Status: DC
  Administered 2022-11-14: 1.5 mg via TRANSDERMAL

## 2022-11-14 MED ORDER — ONDANSETRON HCL 4 MG/2ML IJ SOLN
INTRAMUSCULAR | Status: AC
  Filled 2022-11-14: qty 2

## 2022-11-14 MED ORDER — LIDOCAINE HCL (PF) 2 % IJ SOLN
INTRAMUSCULAR | Status: AC
  Filled 2022-11-14: qty 5

## 2022-11-14 MED ORDER — CEFAZOLIN SODIUM-DEXTROSE 2-4 GM/100ML-% IV SOLN
INTRAVENOUS | Status: AC
  Filled 2022-11-14: qty 100

## 2022-11-14 MED ORDER — FENTANYL CITRATE (PF) 100 MCG/2ML IJ SOLN
100.0000 ug | Freq: Once | INTRAMUSCULAR | Status: AC
  Administered 2022-11-14: 100 ug via INTRAVENOUS

## 2022-11-14 MED ORDER — FENTANYL CITRATE (PF) 100 MCG/2ML IJ SOLN: 25.0000 ug | INTRAMUSCULAR | Status: DC | PRN

## 2022-11-14 MED ORDER — ACETAMINOPHEN 500 MG PO TABS: 1000.0000 mg | ORAL_TABLET | Freq: Four times a day (QID) | ORAL | 0 refills | Status: AC | PRN

## 2022-11-14 MED ORDER — LACTATED RINGERS IV SOLN: INTRAVENOUS | Status: DC

## 2022-11-14 MED ORDER — GLYCOPYRROLATE PF 0.2 MG/ML IJ SOSY
PREFILLED_SYRINGE | INTRAMUSCULAR | Status: AC
  Filled 2022-11-14: qty 1

## 2022-11-14 MED ORDER — GLYCOPYRROLATE PF 0.2 MG/ML IJ SOSY
PREFILLED_SYRINGE | INTRAMUSCULAR | Status: DC | PRN
  Administered 2022-11-14: .2 mg via INTRAVENOUS

## 2022-11-14 MED ORDER — EPHEDRINE 5 MG/ML INJ
INTRAVENOUS | Status: AC
  Filled 2022-11-14: qty 5

## 2022-11-14 SURGICAL SUPPLY — 57 items
APL PRP STRL LF DISP 70% ISPRP (MISCELLANEOUS) ×1
BIT DRILL CANN 3.5 (BIT) ×1
BIT DRILL SRG 3.5XCAN FFTH MTR (BIT) IMPLANT
BIT DRL SRG 3.5XCANN FIFTH MTR (BIT) ×1
BLADE SURG 15 STRL LF DISP TIS (BLADE) ×1 IMPLANT
BLADE SURG 15 STRL SS (BLADE) ×2
BNDG CMPR 5X3 CHSV STRCH STRL (GAUZE/BANDAGES/DRESSINGS) ×1
BNDG CMPR 9X4 STRL LF SNTH (GAUZE/BANDAGES/DRESSINGS) ×1
BNDG COHESIVE 3X5 TAN ST LF (GAUZE/BANDAGES/DRESSINGS) IMPLANT
BNDG ELASTIC 4X5.8 VLCR STR LF (GAUZE/BANDAGES/DRESSINGS) ×1 IMPLANT
BNDG ELASTIC 6X5.8 VLCR STR LF (GAUZE/BANDAGES/DRESSINGS) IMPLANT
BNDG ESMARK 4X9 LF (GAUZE/BANDAGES/DRESSINGS) ×1 IMPLANT
CHLORAPREP W/TINT 26 (MISCELLANEOUS) ×1 IMPLANT
CORD BIPOLAR FORCEPS 12FT (ELECTRODE) ×1 IMPLANT
COVER BACK TABLE 60X90IN (DRAPES) ×1 IMPLANT
DRAPE C-ARM 35X43 STRL (DRAPES) ×1 IMPLANT
DRAPE EXTREMITY T 121X128X90 (DISPOSABLE) ×1 IMPLANT
DRAPE IMP U-DRAPE 54X76 (DRAPES) ×1 IMPLANT
DRAPE SURG 17X23 STRL (DRAPES) IMPLANT
DRSG EMULSION OIL 3X3 NADH (GAUZE/BANDAGES/DRESSINGS) ×1 IMPLANT
GAUZE SPONGE 4X4 12PLY STRL (GAUZE/BANDAGES/DRESSINGS) ×1 IMPLANT
GAUZE XEROFORM 1X8 LF (GAUZE/BANDAGES/DRESSINGS) IMPLANT
GLOVE BIOGEL PI IND STRL 8 (GLOVE) ×2 IMPLANT
GLOVE SURG SS PI 7.5 STRL IVOR (GLOVE) IMPLANT
GOWN STRL REUS W/TWL LRG LVL3 (GOWN DISPOSABLE) ×3 IMPLANT
GUIDEWIRE .07X8 (WIRE) IMPLANT
KIT TURNOVER CYSTO (KITS) ×1 IMPLANT
NDL HYPO 25X1 1.5 SAFETY (NEEDLE) ×1 IMPLANT
NEEDLE HYPO 25X1 1.5 SAFETY (NEEDLE) ×1 IMPLANT
NS IRRIG 500ML POUR BTL (IV SOLUTION) IMPLANT
PACK BASIN DAY SURGERY FS (CUSTOM PROCEDURE TRAY) ×1 IMPLANT
PAD CAST 4YDX4 CTTN HI CHSV (CAST SUPPLIES) ×1 IMPLANT
PADDING CAST ABS COTTON 4X4 ST (CAST SUPPLIES) ×1 IMPLANT
PADDING CAST ABS COTTON 6X4 NS (CAST SUPPLIES) IMPLANT
PADDING CAST COTTON 4X4 STRL (CAST SUPPLIES) ×3
SCREW LN PT 40X4.5XST SLD (Screw) IMPLANT
SCREW LP 4.5X40MM (Screw) ×1 IMPLANT
SPIKE FLUID TRANSFER (MISCELLANEOUS) IMPLANT
SPLINT PLASTER CAST FAST 5X30 (CAST SUPPLIES) IMPLANT
SPLINT PLASTER CAST XFAST 3X15 (CAST SUPPLIES) IMPLANT
SPLINT PLASTER CAST XFAST 4X15 (CAST SUPPLIES) IMPLANT
STRIP CLOSURE SKIN 1/2X4 (GAUZE/BANDAGES/DRESSINGS) ×1 IMPLANT
SUCTION FRAZIER HANDLE 10FR (MISCELLANEOUS)
SUCTION TUBE FRAZIER 10FR DISP (MISCELLANEOUS) IMPLANT
SUT ETHILON 3 0 PS 1 (SUTURE) IMPLANT
SUT MON AB 2-0 CT1 36 (SUTURE) ×1 IMPLANT
SUT MON AB 4-0 PC3 18 (SUTURE) IMPLANT
SUT PROLENE 3 0 PS 2 (SUTURE) IMPLANT
SUT VIC AB 2-0 SH 27 (SUTURE)
SUT VIC AB 2-0 SH 27XBRD (SUTURE) IMPLANT
SUT VIC AB 3-0 FS2 27 (SUTURE) IMPLANT
SYR BULB EAR ULCER 3OZ GRN STR (SYRINGE) ×1 IMPLANT
SYR CONTROL 10ML LL (SYRINGE) ×1 IMPLANT
TAP BONE CANN 4.5MM (BIT) IMPLANT
TOWEL OR 17X26 10 PK STRL BLUE (TOWEL DISPOSABLE) ×1 IMPLANT
TUBE CONNECTING 12X1/4 (SUCTIONS) IMPLANT
UNDERPAD 30X36 HEAVY ABSORB (UNDERPADS AND DIAPERS) ×1 IMPLANT

## 2022-11-14 NOTE — Discharge Instructions (Addendum)
POST-OPERATIVE OPIOID TAPER INSTRUCTIONS: It is important to wean off of your opioid medication as soon as possible. If you do not need pain medication after your surgery it is ok to stop day one. Opioids include: Codeine, Hydrocodone(Norco, Vicodin), Oxycodone(Percocet, oxycontin) and hydromorphone amongst others.  Long term and even short term use of opiods can cause: Increased pain response Dependence Constipation Depression Respiratory depression And more.  Withdrawal symptoms can include Flu like symptoms Nausea, vomiting And more Techniques to manage these symptoms Hydrate well Eat regular healthy meals Stay active Use relaxation techniques(deep breathing, meditating, yoga) Do Not substitute Alcohol to help with tapering If you have been on opioids for less than two weeks and do not have pain than it is ok to stop all together.  Plan to wean off of opioids This plan should start within one week post op of your joint replacement. Maintain the same interval or time between taking each dose and first decrease the dose.  Cut the total daily intake of opioids by one tablet each day Next start to increase the time between doses. The last dose that should be eliminated is the evening dose.      Post Anesthesia Home Care Instructions  Activity: Get plenty of rest for the remainder of the day. A responsible individual must stay with you for 24 hours following the procedure.  For the next 24 hours, DO NOT: -Drive a car -Advertising copywriter -Drink alcoholic beverages -Take any medication unless instructed by your physician -Make any legal decisions or sign important papers.  Meals: Start with liquid foods such as gelatin or soup. Progress to regular foods as tolerated. Avoid greasy, spicy, heavy foods. If nausea and/or vomiting occur, drink only clear liquids until the nausea and/or vomiting subsides. Call your physician if vomiting continues.  Special  Instructions/Symptoms: Your throat may feel dry or sore from the anesthesia or the breathing tube placed in your throat during surgery. If this causes discomfort, gargle with warm salt water. The discomfort should disappear within 24 hours.  If you had a scopolamine patch placed behind your ear for the management of post- operative nausea and/or vomiting:  1. The medication in the patch is effective for 72 hours, after which it should be removed.  Wrap patch in a tissue and discard in the trash. Wash hands thoroughly with soap and water. 2. You may remove the patch earlier than 72 hours if you experience unpleasant side effects which may include dry mouth, dizziness or visual disturbances. 3. Avoid touching the patch. Wash your hands with soap and water after contact with the patch. Remove patch by Friday, 11/17/22   No acetaminophen/Tylenol until after 1:15 pm today if needed.     Regional Anesthesia Blocks  1. Numbness or the inability to move the "blocked" extremity may last from 3-48 hours after placement. The length of time depends on the medication injected and your individual response to the medication. If the numbness is not going away after 48 hours, call your surgeon.  2. The extremity that is blocked will need to be protected until the numbness is gone and the  Strength has returned. Because you cannot feel it, you will need to take extra care to avoid injury. Because it may be weak, you may have difficulty moving it or using it. You may not know what position it is in without looking at it while the block is in effect.  3. For blocks in the legs and feet, returning to weight bearing  and walking needs to be done carefully. You will need to wait until the numbness is entirely gone and the strength has returned. You should be able to move your leg and foot normally before you try and bear weight or walk. You will need someone to be with you when you first try to ensure you do not fall  and possibly risk injury.  4. Bruising and tenderness at the needle site are common side effects and will resolve in a few days.  5. Persistent numbness or new problems with movement should be communicated to the surgeon   Information for Discharge Teaching: EXPAREL (bupivacaine liposome injectable suspension)   Your surgeon or anesthesiologist gave you EXPAREL(bupivacaine) to help control your pain after surgery.  EXPAREL is a local anesthetic that provides pain relief by numbing the tissue around the surgical site. EXPAREL is designed to release pain medication over time and can control pain for up to 72 hours. Depending on how you respond to EXPAREL, you may require less pain medication during your recovery.  Possible side effects: Temporary loss of sensation or ability to move in the area where bupivacaine was injected. Nausea, vomiting, constipation Rarely, numbness and tingling in your mouth or lips, lightheadedness, or anxiety may occur. Call your doctor right away if you think you may be experiencing any of these sensations, or if you have other questions regarding possible side effects.  Follow all other discharge instructions given to you by your surgeon or nurse. Eat a healthy diet and drink plenty of water or other fluids.  If you return to the hospital for any reason within 96 hours following the administration of EXPAREL, it is important for health care providers to know that you have received this anesthetic. A teal colored band has been placed on your arm with the date, time and amount of EXPAREL you have received in order to alert and inform your health care providers. Please leave this armband in place for the full 96 hours following administration, and then you may remove the band. (Saturday, 11/18/22)

## 2022-11-14 NOTE — Anesthesia Postprocedure Evaluation (Signed)
Anesthesia Post Note  Patient: Natasha Cain  Procedure(s) Performed: OPEN REDUCTION INTERNAL FIXATION (ORIF) METATARSAL (TOE) FRACTURE (Left: Toe)     Patient location during evaluation: PACU Anesthesia Type: General Level of consciousness: sedated Pain management: pain level controlled Vital Signs Assessment: post-procedure vital signs reviewed and stable Respiratory status: spontaneous breathing and respiratory function stable Cardiovascular status: stable Postop Assessment: no apparent nausea or vomiting Anesthetic complications: no  No notable events documented.  Last Vitals:  Vitals:   11/14/22 1000 11/14/22 1050  BP: (!) 110/59 99/61  Pulse: (!) 52 (!) 53  Resp: 15 14  Temp:  36.6 C  SpO2: 98% 98%    Last Pain:  Vitals:   11/14/22 1050  TempSrc:   PainSc: 0-No pain                 Deasiah Hagberg DANIEL

## 2022-11-14 NOTE — Transfer of Care (Signed)
Immediate Anesthesia Transfer of Care Note  Patient: Nathaniel Yaden  Procedure(s) Performed: OPEN REDUCTION INTERNAL FIXATION (ORIF) METATARSAL (TOE) FRACTURE (Left: Toe)  Patient Location: PACU  Anesthesia Type:General and Regional  Level of Consciousness: awake, alert , and oriented  Airway & Oxygen Therapy: Patient Spontanous Breathing  Post-op Assessment: Report given to RN and Post -op Vital signs reviewed and stable  Post vital signs: Reviewed and stable  Last Vitals:  Vitals Value Taken Time  BP 100/49 11/14/22 0941  Temp 36.4 C 11/14/22 0941  Pulse 61 11/14/22 0947  Resp 10 11/14/22 0947  SpO2 97 % 11/14/22 0947  Vitals shown include unvalidated device data.  Last Pain:  Vitals:   11/14/22 0736  TempSrc: Oral  PainSc: 0-No pain      Patients Stated Pain Goal: 4 (11/14/22 0736)  Complications: No notable events documented.

## 2022-11-14 NOTE — Op Note (Signed)
11/14/2022  9:51 AM  PATIENT:  [name]    PRE-OPERATIVE DIAGNOSIS:  LEFT 5TH METATARSAL FRACTURE  POST-OPERATIVE DIAGNOSIS:  Same  PROCEDURE:  OPEN REDUCTION INTERNAL FIXATION (ORIF) METATARSAL (TOE) FRACTURE  SURGEON:  Sheral Apley, MD  ASSISTANT: Levester Fresh, PA-C, he was present and scrubbed throughout the case, critical for completion in a timely fashion, and for retraction, instrumentation, and closure.   ANESTHESIA:   LMA  PREOPERATIVE INDICATIONS:  Natasha Cain is a  46 y.o. female with a diagnosis of LEFT 5TH METATARSAL FRACTURE who failed conservative measures and elected for surgical management.    The risks benefits and alternatives were discussed with the patient preoperatively including but not limited to the risks of infection, bleeding, nerve injury, cardiopulmonary complications, the need for revision surgery, among others, and the patient was willing to proceed.  OPERATIVE IMPLANTS: Arthrex 5th MT solid screw  OPERATIVE FINDINGS: Jones fracture   BLOOD LOSS: min  COMPLICATIONS: none  TOURNIQUET TIME: none  OPERATIVE PROCEDURE:  Patient was identified in the preoperative holding area and site was marked by me She was transported to the operating theater and placed on the table in supine position taking care to pad all bony prominences. After a preincinduction time out anesthesia was induced. The left lower extremity was prepped and draped in normal sterile fashion and a pre-incision timeout was performed. She received ancef for preoperative antibiotics.   K wire was introduced using fluoroscopy and inserted into the intermedullary canal of the 5th MT. Care was taken to cross the fracture. I then made a 75mm incision at the pin site.   I used a cannulated drill to open an entry into the canal of the bone.  I sequentialy tapped until I had a good purchase on the bone.   I selected the appropriate sized and length partially threaded screw and inserted  this under xray visualization.   I examined 3 xray views of the foot and was happy with fracture reduction and hardware placement.   Incision was closed and a sterile dressing and splint were applied.   POST OPERATIVE PLAN: NWB for 1-2 wks. Chemical dvt px where appropriate and mobilization for dvt px.

## 2022-11-14 NOTE — Progress Notes (Signed)
Assisted Dr. Singer with left, popliteal, ultrasound guided block. Side rails up, monitors on throughout procedure. See vital signs in flow sheet. Tolerated Procedure well. 

## 2022-11-14 NOTE — Interval H&P Note (Signed)
History and Physical Interval Note:  11/14/2022 6:59 AM  Natasha Cain  has presented today for surgery, with the diagnosis of LEFT 5TH METATARSAL FRACTURE.  The various methods of treatment have been discussed with the patient and family. After consideration of risks, benefits and other options for treatment, the patient has consented to  Procedure(s): OPEN REDUCTION INTERNAL FIXATION (ORIF) METATARSAL (TOE) FRACTURE (Left) as a surgical intervention.  The patient's history has been reviewed, patient examined, no change in status, stable for surgery.  I have reviewed the patient's chart and labs.  Questions were answered to the patient's satisfaction.     Sheral Apley

## 2022-11-14 NOTE — Anesthesia Procedure Notes (Signed)
Procedure Name: LMA Insertion Date/Time: 11/14/2022 8:38 AM  Performed by: Bishop Limbo, CRNAPre-anesthesia Checklist: Patient identified, Emergency Drugs available, Suction available and Patient being monitored Patient Re-evaluated:Patient Re-evaluated prior to induction Oxygen Delivery Method: Circle System Utilized Preoxygenation: Pre-oxygenation with 100% oxygen Induction Type: IV induction Ventilation: Mask ventilation without difficulty LMA: LMA inserted LMA Size: 4.0 Number of attempts: 1 Airway Equipment and Method: Bite block Placement Confirmation: positive ETCO2 Tube secured with: Tape Dental Injury: Teeth and Oropharynx as per pre-operative assessment

## 2022-11-14 NOTE — Anesthesia Procedure Notes (Signed)
Anesthesia Regional Block: Popliteal block   Pre-Anesthetic Checklist: , timeout performed,  Correct Patient, Correct Site, Correct Laterality,  Correct Procedure, Correct Position, site marked,  Risks and benefits discussed,  Surgical consent,  Pre-op evaluation,  At surgeon's request and post-op pain management  Laterality: Left  Prep: chloraprep       Needles:  Injection technique: Single-shot  Needle Type: Echogenic Stimulator Needle          Additional Needles:   Procedures:,,,, ultrasound used (permanent image in chart),,    Narrative:  Start time: 11/14/2022 7:51 AM End time: 11/14/2022 8:01 AM Injection made incrementally with aspirations every 5 mL.  Performed by: Personally  Anesthesiologist: Heather Roberts, MD  Additional Notes: A functioning IV was confirmed and monitors were applied.  Sterile prep and drape, hand hygiene and sterile gloves were used.  Negative aspiration and test dose prior to incremental administration of local anesthetic. The patient tolerated the procedure well.Ultrasound  guidance: relevant anatomy identified, needle position confirmed, local anesthetic spread visualized around nerve(s), vascular puncture avoided.  Image printed for medical record.

## 2022-11-15 ENCOUNTER — Ambulatory Visit: Payer: Medicaid Other | Admitting: Podiatry

## 2022-11-15 ENCOUNTER — Encounter (HOSPITAL_BASED_OUTPATIENT_CLINIC_OR_DEPARTMENT_OTHER): Payer: Self-pay | Admitting: Orthopedic Surgery

## 2022-12-22 NOTE — H&P (Signed)
PREOPERATIVE H&P  Chief Complaint: RIGHT 5TH METATARSAL FRACTURE  HPI: Natasha Cain is a 47 y.o. female who presents with a diagnosis of RIGHT 5TH METATARSAL FRACTURE. Symptoms are rated as moderate to severe, and have been worsening.  This is significantly impairing activities of daily living.  She has elected for surgical management.   Past Medical History:  Diagnosis Date   Anxiety    Arthritis    Asthma    Complication of anesthesia    patietn wakes up in panic mode   COPD (chronic obstructive pulmonary disease) ([place])    Depression    Diabetes ([place])    Hyperlipidemia    Hypertension    Hypothyroidism    PONV (postoperative nausea and vomiting)    Substance abuse ([place])    Thyroid disease    Past Surgical History:  Procedure Laterality Date   BACK SURGERY     CESAREAN SECTION  05/22/07   CHOLECYSTECTOMY  2009   INCISIONAL HERNIA REPAIR N/A 04/03/2022   Procedure: HERNIA REPAIR INCISIONAL W/ MESH;  Surgeon: Aviva Signs, MD;  Location: [facility];  Service: General;  Laterality: N/A;   KNEE SURGERY  07/2011   left   ORIF TOE FRACTURE Left 11/14/2022   Procedure: OPEN REDUCTION INTERNAL FIXATION (ORIF) METATARSAL (TOE) FRACTURE;  Surgeon: Renette Butters, MD;  Location: [facility];  Service: Orthopedics;  Laterality: Left;   SPINE SURGERY  05/15/12   Social History   Socioeconomic History   Marital status: Married    Spouse name: Not on file   Number of children: Not on file   Years of education: Not on file   Highest education level: Not on file  Occupational History   Not on file  Tobacco Use   Smoking status: Every Day    Packs/day: 0.50    Types: Cigarettes   Smokeless tobacco: Not on file  Substance and Sexual Activity   Alcohol use: No   Drug use: No   Sexual activity: Never    Birth control/protection: None  Other Topics Concern   Not on file  Social History Narrative   Not on file   Social Determinants of Health   Financial Resource  Strain: Not on file  Food Insecurity: Not on file  Transportation Needs: Not on file  Physical Activity: Not on file  Stress: Not on file  Social Connections: Not on file   Family History  Problem Relation Age of Onset   Cancer Mother        breast   Cancer Maternal Aunt        skin cancer on face   Cancer Paternal Uncle        colon   Cancer Maternal Grandfather        lung   Cancer Paternal Grandfather        colon   Cancer Cousin        colon   Allergies  Allergen Reactions   Cymbalta [Duloxetine Hcl] Anaphylaxis   Latex Rash   Zorvolex [Diclofenac] Swelling    Stroke like symptoms including tingling in arms and tongue in addition to swelling   Ambien [Zolpidem Tartrate] Other (See Comments)    Hallucinations   Codeine Rash   Darvon [Propoxyphene] Rash    Darvocet   Mobic [Meloxicam] Other (See Comments)    Numbness in arms   Sulfa Antibiotics Hives and Rash   Prior to Admission medications   Medication Sig Start Date End Date Taking? Authorizing Provider  acetaminophen (TYLENOL) 500 MG tablet Take 2 tablets (1,000 mg total) by mouth every 6 (six) hours as needed for mild pain or moderate pain. 11/14/22   Britt Bottom, PA-C  albuterol (VENTOLIN HFA) 108 (90 Base) MCG/ACT inhaler Inhale 2 puffs into the lungs every 6 (six) hours as needed for wheezing or shortness of breath.    [provider]  amphetamine-dextroamphetamine (ADDERALL) 30 MG tablet Take 20 mg by mouth daily in the afternoon. 10/25/21   [provider]  ARIPiprazole (ABILIFY) 5 MG tablet Take 5 mg by mouth daily.    [provider]  aspirin EC 81 MG tablet Take 1 tablet (81 mg total) by mouth 2 (two) times daily. To prevent blood clots for 30 days after surgery. 11/14/22   Britt Bottom, PA-C  atenolol (TENORMIN) 50 MG tablet Take 50 mg by mouth daily. 03/20/22   [provider]  chlorthalidone (HYGROTON) 25 MG tablet Take 25 mg by mouth daily.    [provider]  cholecalciferol (VITAMIN D3) 25 MCG (1000 UNIT) tablet Take 3,000 Units by mouth daily.    [provider]  cloNIDine (CATAPRES) 0.3 MG tablet Take 0.3 mg by mouth at bedtime.    [provider]  Coenzyme Q10 (CO Q-10) 50 MG CAPS Take 50 mg by mouth daily.    [provider]  Fluticasone-Umeclidin-Vilant (TRELEGY ELLIPTA) 100-62.5-25 MCG/ACT AEPB Inhale 1 puff into the lungs daily.    [provider]  furosemide (LASIX) 20 MG tablet Take 20 mg by mouth daily as needed for edema.    [provider]  gabapentin (NEURONTIN) 400 MG capsule Take 400 mg by mouth in the morning, at noon, in the evening, and at bedtime. 11/30/20   [provider]  hydrOXYzine (VISTARIL) 100 MG capsule Take 100 mg by mouth at bedtime. 10/25/21   [provider]  levothyroxine (SYNTHROID) 125 MCG tablet Take 125 mcg by mouth daily before breakfast. 10/21/20   [provider]  lidocaine (LIDODERM) 5 % Place 1 patch onto the skin daily. Remove & Discard patch within 12 hours or as directed by MD 07/08/22   Hendricks Limes, PA-C  meloxicam (MOBIC) 15 MG tablet Take 1 tablet (15 mg total) by mouth daily. 07/07/22   Edrick Kins, DPM  metFORMIN (GLUCOPHAGE) 1000 MG tablet Take 1,000 mg by mouth 2 (two) times daily with a meal.    [provider]  METHADONE HCL PO Take 120 mg by mouth daily.    [provider]  methylPREDNISolone (MEDROL DOSEPAK) 4 MG TBPK tablet Take as directed 06/30/22   Felipa Furnace, DPM  Multiple Vitamins-Minerals (MULTIVITAMIN WITH MINERALS) tablet Take 1 tablet by mouth daily.    [provider]  nystatin (MYCOSTATIN/NYSTOP) powder Apply 1 application. topically daily. 12/20/21   [provider]  Omega-3 Fatty Acids (FISH OIL) 1000 MG CAPS Take 1,000 mg by mouth daily.    [provider]  omeprazole (PRILOSEC) 40 MG capsule Take 80 mg by mouth every morning.    [provider]  ondansetron (ZOFRAN-ODT) 4 MG disintegrating tablet Take 1 tablet (4 mg total) by mouth 2 (two) times daily as needed for nausea or vomiting. 11/14/22   Britt Bottom, PA-C  Potassium 99 MG TABS Take 198 mg by mouth daily.    [provider]  potassium chloride SA (KLOR-CON M) 20 MEQ tablet Take 1 tablet (20 mEq total) by mouth 3 (three) times daily.  03/30/22   Aviva Signs, MD  predniSONE (DELTASONE) 20 MG tablet Take 2 tablets (40 mg total) by mouth daily. 07/08/22   Myna Bright M, PA-C  rOPINIRole (REQUIP) 1 MG tablet Take 1 mg by mouth at bedtime.    [provider]  sertraline (ZOLOFT) 100 MG tablet Take 200 mg by mouth at bedtime. 05/31/16   [provider]  tiZANidine (ZANAFLEX) 4 MG tablet Take 4 mg by mouth every 8 (eight) hours as needed for muscle spasms.    [provider]  topiramate (TOPAMAX) 100 MG tablet Take 100 mg by mouth at bedtime. 10/29/20   [provider]  VYVANSE 70 MG capsule Take 70 mg by mouth every morning. 10/25/21   [provider]  zinc gluconate 50 MG tablet Take 50 mg by mouth at bedtime.    [provider]     Positive ROS: All other systems have been reviewed and were otherwise negative with the exception of those mentioned in the HPI and as above.  Physical Exam: General: Alert, no acute distress Cardiovascular: No pedal edema Respiratory: No cyanosis, no use of accessory musculature GI: No organomegaly, abdomen is soft and non-tender Skin: No lesions in the area of chief complaint Neurologic: Sensation intact distally Psychiatric: Patient is competent for consent with normal mood and affect Lymphatic: No axillary or cervical lymphadenopathy  MUSCULOSKELETAL: TTP right 5th metatarsal, painful foot ROM, pain with WB, no edema or ecchymosis seen, NVI   Imaging: 3v foot xrays show a right 5th metatarsal base fracture with a moderate amount of distraction at the fracture  site   Assessment: RIGHT 5TH METATARSAL FRACTURE  Plan: Plan for Procedure(s): OPEN REDUCTION INTERNAL FIXATION (ORIF) METATARSAL (TOE) FRACTURE  The risks benefits and alternatives were discussed with the patient including but not limited to the risks of nonoperative treatment, versus surgical intervention including infection, bleeding, nerve injury,  blood clots, cardiopulmonary complications, morbidity, mortality, among others, and they were willing to proceed.   Weightbearing: NWB RLE Orthopedic devices: splint Showering: keep splint dry Dressing: reinforce PRN Medicines: ASA, Norco, Zofran (can't have NSAIDs)  Discharge: home Follow up: 01/05/23 at 11:15am    Britt Bottom, PA-C Office [phone] 12/22/2022 5:54 PM

## 2022-12-25 ENCOUNTER — Other Ambulatory Visit: Payer: Self-pay

## 2022-12-25 ENCOUNTER — Encounter (HOSPITAL_BASED_OUTPATIENT_CLINIC_OR_DEPARTMENT_OTHER): Payer: Self-pay | Admitting: Orthopedic Surgery

## 2022-12-25 NOTE — Progress Notes (Signed)
   12/25/22 0855  OBSTRUCTIVE SLEEP APNEA  Have you ever been diagnosed with sleep apnea through a sleep study? No  Do you snore loudly (loud enough to be heard through closed doors)?  1  Do you often feel tired, fatigued, or sleepy during the daytime (such as falling asleep during driving or talking to someone)? 0  Has anyone observed you stop breathing during your sleep? 1  Do you have, or are you being treated for high blood pressure? 1  BMI more than 35 kg/m2? 1  Age > 50 (1-yes) 0  Neck circumference greater than:Female 16 inches or larger, Female 17inches or larger? 1  Female Gender (Yes=1) 0  Obstructive Sleep Apnea Score 5  Score 5 or greater  Results sent to PCP

## 2022-12-27 ENCOUNTER — Encounter (HOSPITAL_BASED_OUTPATIENT_CLINIC_OR_DEPARTMENT_OTHER)
Admission: RE | Admit: 2022-12-27 | Discharge: 2022-12-27 | Disposition: A | Discharge status: Home / Self Care | Payer: Medicaid Other | Source: Ambulatory Visit | Attending: Orthopedic Surgery | Admitting: Orthopedic Surgery

## 2022-12-27 DIAGNOSIS — Z01812 Encounter for preprocedural laboratory examination: Secondary | ICD-10-CM | POA: Insufficient documentation

## 2022-12-27 LAB — BASIC METABOLIC PANEL
Anion gap: 10 (ref 5–15)
BUN: 14 mg/dL (ref 6–20)
CO2: 25 mmol/L (ref 22–32)
Calcium: 9 mg/dL (ref 8.9–10.3)
Chloride: 102 mmol/L (ref 98–111)
Creatinine, Ser: 1.02 mg/dL — ABNORMAL HIGH (ref 0.44–1.00)
GFR, Estimated: >60 mL/min (ref >60)
Glucose, Bld: 107 mg/dL — ABNORMAL HIGH (ref 70–99)
Potassium: 4.2 mmol/L (ref 3.5–5.1)
Sodium: 137 mmol/L (ref 135–145)

## 2022-12-27 NOTE — Progress Notes (Signed)

## 2022-12-27 NOTE — Progress Notes (Signed)
EKG reviewed by Dr. Daiva Huge, pt asymptomatic, will proceed with surgery as scheduled.

## 2022-12-28 NOTE — Anesthesia Preprocedure Evaluation (Addendum)
Anesthesia Evaluation  Patient identified by MRN, date of birth, ID band Patient awake    Reviewed: Allergy & Precautions, H&P , NPO status , Patient's Chart, lab work & pertinent test results, reviewed documented beta blocker date and time   History of Anesthesia Complications (+) PONV and history of anesthetic complications  Airway Mallampati: III  TM Distance: >3 FB Neck ROM: Full    Dental no notable dental hx. (+) Teeth Intact, Dental Advisory Given   Pulmonary asthma , COPD,  COPD inhaler, Current Smoker and Patient abstained from smoking.   Pulmonary exam normal breath sounds clear to auscultation       Cardiovascular hypertension, Pt. on medications and Pt. on home beta blockers  Rhythm:Regular Rate:Normal     Neuro/Psych   Anxiety Depression    negative neurological ROS     GI/Hepatic negative GI ROS, Neg liver ROS,,,  Endo/Other  diabetes, Type 2, Oral Hypoglycemic AgentsHypothyroidism  Morbid obesity  Renal/GU negative Renal ROS  negative genitourinary   Musculoskeletal  (+) Arthritis , Osteoarthritis,    Abdominal   Peds  Hematology negative hematology ROS (+)   Anesthesia Other Findings   Reproductive/Obstetrics negative OB ROS                             Anesthesia Physical Anesthesia Plan  ASA: 3  Anesthesia Plan: General   Post-op Pain Management: Regional block*, Tylenol PO (pre-op)* and Toradol IV (intra-op)*   Induction: Intravenous  PONV Risk Score and Plan: 3 and Ondansetron, Dexamethasone and Midazolam  Airway Management Planned: LMA  Additional Equipment:   Intra-op Plan:   Post-operative Plan: Extubation in OR  Informed Consent: I have reviewed the patients History and Physical, chart, labs and discussed the procedure including the risks, benefits and alternatives for the proposed anesthesia with the patient or authorized representative who has  indicated his/her understanding and acceptance.     Dental advisory given  Plan Discussed with: CRNA  Anesthesia Plan Comments:        Anesthesia Quick Evaluation

## 2022-12-29 ENCOUNTER — Ambulatory Visit (HOSPITAL_BASED_OUTPATIENT_CLINIC_OR_DEPARTMENT_OTHER): Payer: Medicaid Other | Admitting: Anesthesiology

## 2022-12-29 ENCOUNTER — Encounter (HOSPITAL_BASED_OUTPATIENT_CLINIC_OR_DEPARTMENT_OTHER): Payer: Self-pay | Admitting: Orthopedic Surgery

## 2022-12-29 ENCOUNTER — Other Ambulatory Visit: Payer: Self-pay

## 2022-12-29 ENCOUNTER — Encounter (HOSPITAL_BASED_OUTPATIENT_CLINIC_OR_DEPARTMENT_OTHER)
Admission: RE | Disposition: A | Discharge status: Home / Self Care | Payer: Self-pay | Source: Home / Self Care | Attending: Orthopedic Surgery

## 2022-12-29 ENCOUNTER — Ambulatory Visit (HOSPITAL_BASED_OUTPATIENT_CLINIC_OR_DEPARTMENT_OTHER): Payer: Medicaid Other

## 2022-12-29 ENCOUNTER — Ambulatory Visit (HOSPITAL_BASED_OUTPATIENT_CLINIC_OR_DEPARTMENT_OTHER)
Admission: RE | Admit: 2022-12-29 | Discharge: 2022-12-29 | Disposition: A | Discharge status: Home / Self Care | Payer: Medicaid Other | Attending: Orthopedic Surgery | Admitting: Orthopedic Surgery

## 2022-12-29 DIAGNOSIS — F17210 Nicotine dependence, cigarettes, uncomplicated: Secondary | ICD-10-CM | POA: Diagnosis not present

## 2022-12-29 DIAGNOSIS — Z7984 Long term (current) use of oral hypoglycemic drugs: Secondary | ICD-10-CM | POA: Insufficient documentation

## 2022-12-29 DIAGNOSIS — J449 Chronic obstructive pulmonary disease, unspecified: Secondary | ICD-10-CM

## 2022-12-29 DIAGNOSIS — F418 Other specified anxiety disorders: Secondary | ICD-10-CM

## 2022-12-29 DIAGNOSIS — I10 Essential (primary) hypertension: Secondary | ICD-10-CM | POA: Diagnosis not present

## 2022-12-29 DIAGNOSIS — E119 Type 2 diabetes mellitus without complications: Secondary | ICD-10-CM | POA: Insufficient documentation

## 2022-12-29 DIAGNOSIS — X58XXXA Exposure to other specified factors, initial encounter: Secondary | ICD-10-CM | POA: Insufficient documentation

## 2022-12-29 DIAGNOSIS — Z6841 Body Mass Index (BMI) 40.0 and over, adult: Secondary | ICD-10-CM | POA: Insufficient documentation

## 2022-12-29 DIAGNOSIS — S92351A Displaced fracture of fifth metatarsal bone, right foot, initial encounter for closed fracture: Secondary | ICD-10-CM | POA: Diagnosis present

## 2022-12-29 DIAGNOSIS — E039 Hypothyroidism, unspecified: Secondary | ICD-10-CM | POA: Insufficient documentation

## 2022-12-29 HISTORY — PX: ORIF TOE FRACTURE: SHX5032

## 2022-12-29 LAB — GLUCOSE, CAPILLARY
Glucose-Capillary: 94 mg/dL (ref 70–99)
Glucose-Capillary: 107 mg/dL — ABNORMAL HIGH (ref 70–99)

## 2022-12-29 SURGERY — OPEN REDUCTION INTERNAL FIXATION (ORIF) METATARSAL (TOE) FRACTURE
Anesthesia: General | Site: Toe | Laterality: Right

## 2022-12-29 MED ORDER — MIDAZOLAM HCL 5 MG/5ML IJ SOLN
INTRAMUSCULAR | Status: DC | PRN
  Administered 2022-12-29: 1 mg via INTRAVENOUS

## 2022-12-29 MED ORDER — BUPIVACAINE LIPOSOME 1.3 % IJ SUSP
INTRAMUSCULAR | Status: DC | PRN
  Administered 2022-12-29: 10 mL via PERINEURAL

## 2022-12-29 MED ORDER — FENTANYL CITRATE (PF) 100 MCG/2ML IJ SOLN
INTRAMUSCULAR | Status: AC
  Filled 2022-12-29: qty 2

## 2022-12-29 MED ORDER — LIDOCAINE HCL (CARDIAC) PF 100 MG/5ML IV SOSY
PREFILLED_SYRINGE | INTRAVENOUS | Status: DC | PRN
  Administered 2022-12-29: 50 mg via INTRAVENOUS

## 2022-12-29 MED ORDER — PROPOFOL 10 MG/ML IV BOLUS
INTRAVENOUS | Status: DC | PRN
  Administered 2022-12-29: 200 mg via INTRAVENOUS

## 2022-12-29 MED ORDER — 0.9 % SODIUM CHLORIDE (POUR BTL) OPTIME
TOPICAL | Status: DC | PRN
  Administered 2022-12-29: 1000 mL

## 2022-12-29 MED ORDER — MIDAZOLAM HCL 2 MG/2ML IJ SOLN
2.0000 mg | Freq: Once | INTRAMUSCULAR | Status: AC
  Administered 2022-12-29: 2 mg via INTRAVENOUS

## 2022-12-29 MED ORDER — CEFAZOLIN SODIUM-DEXTROSE 2-4 GM/100ML-% IV SOLN
INTRAVENOUS | Status: AC
  Filled 2022-12-29: qty 100

## 2022-12-29 MED ORDER — ACETAMINOPHEN 500 MG PO TABS
1000.0000 mg | ORAL_TABLET | Freq: Once | ORAL | Status: AC
  Administered 2022-12-29: 1000 mg via ORAL

## 2022-12-29 MED ORDER — POVIDONE-IODINE 10 % EX SWAB: 2.0000 | Freq: Once | CUTANEOUS | Status: DC

## 2022-12-29 MED ORDER — BUPIVACAINE-EPINEPHRINE (PF) 0.5% -1:200000 IJ SOLN
INTRAMUSCULAR | Status: DC | PRN
  Administered 2022-12-29: 20 mL via PERINEURAL

## 2022-12-29 MED ORDER — LACTATED RINGERS IV SOLN: INTRAVENOUS | Status: DC

## 2022-12-29 MED ORDER — MIDAZOLAM HCL 2 MG/2ML IJ SOLN
INTRAMUSCULAR | Status: AC
  Filled 2022-12-29: qty 2

## 2022-12-29 MED ORDER — ONDANSETRON HCL 4 MG/2ML IJ SOLN
INTRAMUSCULAR | Status: DC | PRN
  Administered 2022-12-29: 4 mg via INTRAVENOUS

## 2022-12-29 MED ORDER — DEXAMETHASONE SODIUM PHOSPHATE 10 MG/ML IJ SOLN
INTRAMUSCULAR | Status: AC
  Filled 2022-12-29: qty 1

## 2022-12-29 MED ORDER — PROPOFOL 10 MG/ML IV BOLUS
INTRAVENOUS | Status: AC
  Filled 2022-12-29: qty 20

## 2022-12-29 MED ORDER — CEFAZOLIN SODIUM-DEXTROSE 2-4 GM/100ML-% IV SOLN
2.0000 g | INTRAVENOUS | Status: AC
  Administered 2022-12-29: 2 g via INTRAVENOUS
  Administered 2022-12-29: 1 g via INTRAVENOUS

## 2022-12-29 MED ORDER — HYDROCODONE-ACETAMINOPHEN 10-325 MG PO TABS: 1.0000 | ORAL_TABLET | Freq: Four times a day (QID) | ORAL | 0 refills | Status: DC | PRN

## 2022-12-29 MED ORDER — DEXAMETHASONE SODIUM PHOSPHATE 10 MG/ML IJ SOLN: 8.0000 mg | Freq: Once | INTRAMUSCULAR | Status: DC

## 2022-12-29 MED ORDER — ASPIRIN 81 MG PO TBEC: 81.0000 mg | DELAYED_RELEASE_TABLET | Freq: Two times a day (BID) | ORAL | 0 refills | Status: DC

## 2022-12-29 MED ORDER — HYDROMORPHONE HCL 1 MG/ML IJ SOLN: 0.2500 mg | INTRAMUSCULAR | Status: DC | PRN

## 2022-12-29 MED ORDER — FENTANYL CITRATE (PF) 100 MCG/2ML IJ SOLN
100.0000 ug | Freq: Once | INTRAMUSCULAR | Status: AC
  Administered 2022-12-29: 100 ug via INTRAVENOUS

## 2022-12-29 MED ORDER — EPHEDRINE SULFATE (PRESSORS) 50 MG/ML IJ SOLN
INTRAMUSCULAR | Status: DC | PRN
  Administered 2022-12-29: 10 mg via INTRAVENOUS

## 2022-12-29 MED ORDER — FENTANYL CITRATE (PF) 100 MCG/2ML IJ SOLN
INTRAMUSCULAR | Status: DC | PRN
  Administered 2022-12-29: 50 ug via INTRAVENOUS

## 2022-12-29 MED ORDER — ACETAMINOPHEN 500 MG PO TABS
ORAL_TABLET | ORAL | Status: AC
  Filled 2022-12-29: qty 2

## 2022-12-29 MED ORDER — ONDANSETRON HCL 4 MG/2ML IJ SOLN
INTRAMUSCULAR | Status: AC
  Filled 2022-12-29: qty 2

## 2022-12-29 MED ORDER — DEXMEDETOMIDINE HCL IN NACL 400 MCG/100ML IV SOLN
INTRAVENOUS | Status: DC | PRN
  Administered 2022-12-29: 4 ug via INTRAVENOUS

## 2022-12-29 MED ORDER — LIDOCAINE 2% (20 MG/ML) 5 ML SYRINGE
INTRAMUSCULAR | Status: AC
  Filled 2022-12-29: qty 5

## 2022-12-29 MED ORDER — DEXAMETHASONE SODIUM PHOSPHATE 4 MG/ML IJ SOLN
INTRAMUSCULAR | Status: DC | PRN
  Administered 2022-12-29: 8 mg via INTRAVENOUS

## 2022-12-29 SURGICAL SUPPLY — 79 items
APL PRP STRL LF DISP 70% ISPRP (MISCELLANEOUS) ×1
BANDAGE ESMARK 6X9 LF (GAUZE/BANDAGES/DRESSINGS) IMPLANT
BIT DRILL CANN 3.5 (BIT) ×1
BIT DRILL SRG 3.5XCAN FFTH MTR (BIT) IMPLANT
BIT DRL SRG 3.5XCANN FIFTH MTR (BIT) ×1
BLADE SURG 15 STRL LF DISP TIS (BLADE) ×1 IMPLANT
BLADE SURG 15 STRL SS (BLADE) ×1
BNDG CMPR 5X4 CHSV STRCH STRL (GAUZE/BANDAGES/DRESSINGS) ×1
BNDG CMPR 9X4 STRL LF SNTH (GAUZE/BANDAGES/DRESSINGS)
BNDG CMPR 9X6 STRL LF SNTH (GAUZE/BANDAGES/DRESSINGS)
BNDG COHESIVE 4X5 TAN STRL LF (GAUZE/BANDAGES/DRESSINGS) ×1 IMPLANT
BNDG ELASTIC 3X5.8 VLCR STR LF (GAUZE/BANDAGES/DRESSINGS) IMPLANT
BNDG ELASTIC 4X5.8 VLCR STR LF (GAUZE/BANDAGES/DRESSINGS) ×1 IMPLANT
BNDG ELASTIC 6X5.8 VLCR STR LF (GAUZE/BANDAGES/DRESSINGS) ×1 IMPLANT
BNDG ESMARK 4X9 LF (GAUZE/BANDAGES/DRESSINGS) IMPLANT
BNDG ESMARK 6X9 LF (GAUZE/BANDAGES/DRESSINGS)
CHLORAPREP W/TINT 26 (MISCELLANEOUS) ×1 IMPLANT
CLSR STERI-STRIP ANTIMIC 1/2X4 (GAUZE/BANDAGES/DRESSINGS) IMPLANT
COVER BACK TABLE 60X90IN (DRAPES) ×1 IMPLANT
COVER MAYO STAND STRL (DRAPES) IMPLANT
CUFF TOURN SGL QUICK 18X4 (TOURNIQUET CUFF) IMPLANT
CUFF TOURN SGL QUICK 24 (TOURNIQUET CUFF)
CUFF TOURN SGL QUICK 34 (TOURNIQUET CUFF)
CUFF TRNQT CYL 24X4X16.5-23 (TOURNIQUET CUFF) IMPLANT
CUFF TRNQT CYL 34X4.125X (TOURNIQUET CUFF) IMPLANT
DRAPE EXTREMITY T 121X128X90 (DISPOSABLE) ×1 IMPLANT
DRAPE IMP U-DRAPE 54X76 (DRAPES) ×1 IMPLANT
DRAPE OEC MINIVIEW 54X84 (DRAPES) ×1 IMPLANT
DRAPE SURG 17X23 STRL (DRAPES) IMPLANT
DRAPE U-SHAPE 47X51 STRL (DRAPES) ×1 IMPLANT
DRSG EMULSION OIL 3X3 NADH (GAUZE/BANDAGES/DRESSINGS) ×1 IMPLANT
ELECT REM PT RETURN 9FT ADLT (ELECTROSURGICAL) ×1
ELECTRODE REM PT RTRN 9FT ADLT (ELECTROSURGICAL) ×1 IMPLANT
GAUZE SPONGE 4X4 12PLY STRL (GAUZE/BANDAGES/DRESSINGS) ×1 IMPLANT
GAUZE XEROFORM 1X8 LF (GAUZE/BANDAGES/DRESSINGS) IMPLANT
GLOVE BIO SURGEON STRL SZ7.5 (GLOVE) ×1 IMPLANT
GLOVE BIOGEL PI IND STRL 7.5 (GLOVE) ×1 IMPLANT
GLOVE BIOGEL PI IND STRL 8 (GLOVE) ×1 IMPLANT
GLOVE SURG SYN 7.5  E (GLOVE) ×1
GLOVE SURG SYN 7.5 E (GLOVE) ×1 IMPLANT
GLOVE SURG SYN 7.5 PF PI (GLOVE) ×1 IMPLANT
GOWN STRL REUS W/ TWL LRG LVL3 (GOWN DISPOSABLE) ×1 IMPLANT
GOWN STRL REUS W/ TWL XL LVL3 (GOWN DISPOSABLE) ×1 IMPLANT
GOWN STRL REUS W/TWL LRG LVL3 (GOWN DISPOSABLE) ×1
GOWN STRL REUS W/TWL XL LVL3 (GOWN DISPOSABLE) ×2 IMPLANT
GUIDEWIRE .07X8 (WIRE) IMPLANT
NDL HYPO 25X1 1.5 SAFETY (NEEDLE) ×1 IMPLANT
NEEDLE HYPO 25X1 1.5 SAFETY (NEEDLE) IMPLANT
NS IRRIG 1000ML POUR BTL (IV SOLUTION) ×1 IMPLANT
PACK BASIN DAY SURGERY FS (CUSTOM PROCEDURE TRAY) ×1 IMPLANT
PAD CAST 3X4 CTTN HI CHSV (CAST SUPPLIES) IMPLANT
PAD CAST 4YDX4 CTTN HI CHSV (CAST SUPPLIES) ×1 IMPLANT
PADDING CAST ABS COTTON 4X4 ST (CAST SUPPLIES) IMPLANT
PADDING CAST ABS COTTON 6X4 NS (CAST SUPPLIES) ×2 IMPLANT
PADDING CAST COTTON 3X4 STRL (CAST SUPPLIES)
PADDING CAST COTTON 4X4 STRL (CAST SUPPLIES) ×1
PENCIL SMOKE EVACUATOR (MISCELLANEOUS) ×1 IMPLANT
SCREW LP PT 4.5X50MM (Screw) IMPLANT
SLEEVE SCD COMPRESS KNEE MED (STOCKING) IMPLANT
SPIKE FLUID TRANSFER (MISCELLANEOUS) IMPLANT
SPLINT PLASTER CAST FAST 5X30 (CAST SUPPLIES) IMPLANT
SPONGE T-LAP 4X18 ~~LOC~~+RFID (SPONGE) ×1 IMPLANT
STOCKINETTE 6  STRL (DRAPES) ×1
STOCKINETTE 6 STRL (DRAPES) IMPLANT
SUCTION FRAZIER HANDLE 10FR (MISCELLANEOUS) ×1
SUCTION TUBE FRAZIER 10FR DISP (MISCELLANEOUS) IMPLANT
SUT ETHILON 3 0 PS 1 (SUTURE) IMPLANT
SUT MON AB 2-0 CT1 36 (SUTURE) IMPLANT
SUT MON AB 4-0 PC3 18 (SUTURE) IMPLANT
SUT PROLENE 3 0 PS 2 (SUTURE) IMPLANT
SUT VIC AB 2-0 SH 27 (SUTURE)
SUT VIC AB 2-0 SH 27XBRD (SUTURE) IMPLANT
SUT VIC AB 3-0 FS2 27 (SUTURE) IMPLANT
SYR 20ML LL LF (SYRINGE) IMPLANT
SYR BULB EAR ULCER 3OZ GRN STR (SYRINGE) ×1 IMPLANT
TAP BONE CANN 4.5MM (BIT) IMPLANT
TOWEL GREEN STERILE FF (TOWEL DISPOSABLE) ×1 IMPLANT
TUBE CONNECTING 20X1/4 (TUBING) IMPLANT
UNDERPAD 30X36 HEAVY ABSORB (UNDERPADS AND DIAPERS) ×1 IMPLANT

## 2022-12-29 NOTE — Interval H&P Note (Signed)
History and Physical Interval Note:  12/29/2022 12:24 PM  Natasha Cain  has presented today for surgery, with the diagnosis of RIGHT 5TH METATARSAL FRACTURE.  The various methods of treatment have been discussed with the patient and family. After consideration of risks, benefits and other options for treatment, the patient has consented to  Procedure(s): OPEN REDUCTION INTERNAL FIXATION (ORIF) METATARSAL (TOE) FRACTURE (Right) as a surgical intervention.  The patient's history has been reviewed, patient examined, no change in status, stable for surgery.  I have reviewed the patient's chart and labs.  Questions were answered to the patient's satisfaction.     Renette Butters

## 2022-12-29 NOTE — Progress Notes (Signed)
Assisted Dr. Oren Bracket with right, popliteal, ultrasound guided block. Side rails up, monitors on throughout procedure. See vital signs in flow sheet. Tolerated Procedure well.

## 2022-12-29 NOTE — Anesthesia Postprocedure Evaluation (Signed)
Anesthesia Post Note  Patient: Natasha Cain  Procedure(s) Performed: OPEN REDUCTION INTERNAL FIXATION (ORIF) METATARSAL (TOE) FRACTURE (Right: Toe)     Patient location during evaluation: PACU Anesthesia Type: General and Regional Level of consciousness: awake and alert Pain management: pain level controlled Vital Signs Assessment: post-procedure vital signs reviewed and stable Respiratory status: spontaneous breathing, nonlabored ventilation and respiratory function stable Cardiovascular status: blood pressure returned to baseline and stable Postop Assessment: no apparent nausea or vomiting Anesthetic complications: no  No notable events documented.  Last Vitals:  Vitals:   12/29/22 1400 12/29/22 1401  BP: (!) 106/57   Pulse: (!) 58   Resp: 12   Temp:    SpO2: 91% 99%    Last Pain:  Vitals:   12/29/22 1400  TempSrc:   PainSc: 0-No pain                 Arling Cerone,W. EDMOND

## 2022-12-29 NOTE — Anesthesia Procedure Notes (Signed)
Procedure Name: LMA Insertion Date/Time: 12/29/2022 12:43 PM  Performed by: Bufford Spikes, CRNAPre-anesthesia Checklist: Patient identified, Emergency Drugs available, Suction available and Patient being monitored Patient Re-evaluated:Patient Re-evaluated prior to induction Oxygen Delivery Method: Circle system utilized Preoxygenation: Pre-oxygenation with 100% oxygen Induction Type: IV induction Ventilation: Mask ventilation without difficulty LMA: LMA inserted LMA Size: 4.0 Number of attempts: 1 Placement Confirmation: positive ETCO2 Tube secured with: Tape Dental Injury: Teeth and Oropharynx as per pre-operative assessment

## 2022-12-29 NOTE — Discharge Instructions (Addendum)
POST-OPERATIVE OPIOID TAPER INSTRUCTIONS: It is important to wean off of your opioid medication as soon as possible. If you do not need pain medication after your surgery it is ok to stop day one. Opioids include: Codeine, Hydrocodone(Norco, Vicodin), Oxycodone(Percocet, oxycontin) and hydromorphone amongst others.  Long term and even short term use of opiods can cause: Increased pain response Dependence Constipation Depression Respiratory depression And more.  Withdrawal symptoms can include Flu like symptoms Nausea, vomiting And more Techniques to manage these symptoms Hydrate well Eat regular healthy meals Stay active Use relaxation techniques(deep breathing, meditating, yoga) Do Not substitute Alcohol to help with tapering If you have been on opioids for less than two weeks and do not have pain than it is ok to stop all together.  Plan to wean off of opioids This plan should start within one week post op of your joint replacement. Maintain the same interval or time between taking each dose and first decrease the dose.  Cut the total daily intake of opioids by one tablet each day Next start to increase the time between doses. The last dose that should be eliminated is the evening dose.   May have Tylenol today after 4:37 PM   Post Anesthesia Home Care Instructions  Activity: Get plenty of rest for the remainder of the day. A responsible individual must stay with you for 24 hours following the procedure.  For the next 24 hours, DO NOT: -Drive a car -Paediatric nurse -Drink alcoholic beverages -Take any medication unless instructed by your physician -Make any legal decisions or sign important papers.  Meals: Start with liquid foods such as gelatin or soup. Progress to regular foods as tolerated. Avoid greasy, spicy, heavy foods. If nausea and/or vomiting occur, drink only clear liquids until the nausea and/or vomiting subsides. Call your physician if vomiting  continues.  Special Instructions/Symptoms: Your throat may feel dry or sore from the anesthesia or the breathing tube placed in your throat during surgery. If this causes discomfort, gargle with warm salt water. The discomfort should disappear within 24 hours.  If you had a scopolamine patch placed behind your ear for the management of post- operative nausea and/or vomiting:  1. The medication in the patch is effective for 72 hours, after which it should be removed.  Wrap patch in a tissue and discard in the trash. Wash hands thoroughly with soap and water. 2. You may remove the patch earlier than 72 hours if you experience unpleasant side effects which may include dry mouth, dizziness or visual disturbances. 3. Avoid touching the patch. Wash your hands with soap and water after contact with the patch.    Regional Anesthesia Blocks  1. Numbness or the inability to move the "blocked" extremity may last from 3-48 hours after placement. The length of time depends on the medication injected and your individual response to the medication. If the numbness is not going away after 48 hours, call your surgeon.  2. The extremity that is blocked will need to be protected until the numbness is gone and the  Strength has returned. Because you cannot feel it, you will need to take extra care to avoid injury. Because it may be weak, you may have difficulty moving it or using it. You may not know what position it is in without looking at it while the block is in effect.  3. For blocks in the legs and feet, returning to weight bearing and walking needs to be done carefully. You will need to  wait until the numbness is entirely gone and the strength has returned. You should be able to move your leg and foot normally before you try and bear weight or walk. You will need someone to be with you when you first try to ensure you do not fall and possibly risk injury.  4. Bruising and tenderness at the needle site are  common side effects and will resolve in a few days.  5. Persistent numbness or new problems with movement should be communicated to the surgeon or the [facility] [phone] [place] [phone]).

## 2022-12-29 NOTE — Anesthesia Procedure Notes (Signed)
Anesthesia Regional Block: Popliteal block   Pre-Anesthetic Checklist: , timeout performed,  Correct Patient, Correct Site, Correct Laterality,  Correct Procedure, Correct Position, site marked,  Risks and benefits discussed,  Pre-op evaluation,  At surgeon's request and post-op pain management  Laterality: Right  Prep: Maximum Sterile Barrier Precautions used, chloraprep       Needles:  Injection technique: Single-shot  Needle Type: Echogenic Stimulator Needle     Needle Length: 9cm  Needle Gauge: 21     Additional Needles:   Procedures:,,,, ultrasound used (permanent image in chart),,    Narrative:  Start time: 12/29/2022 11:36 AM End time: 12/29/2022 11:46 AM Injection made incrementally with aspirations every 5 mL.  Performed by: Personally  Anesthesiologist: Roderic Palau, MD  Additional Notes: 2% Lidocaine skin wheel.

## 2022-12-29 NOTE — Op Note (Signed)
12/29/2022  1:11 PM  PATIENT:  Natasha Cain    PRE-OPERATIVE DIAGNOSIS:  RIGHT 5TH METATARSAL FRACTURE  POST-OPERATIVE DIAGNOSIS:  Same  PROCEDURE:  OPEN REDUCTION INTERNAL FIXATION (ORIF) METATARSAL (TOE) FRACTURE  SURGEON:  Renette Butters, MD  ASSISTANT: Aggie Moats, PA-C, he was present and scrubbed throughout the case, critical for completion in a timely fashion, and for retraction, instrumentation, and closure.   ANESTHESIA:   gen  PREOPERATIVE INDICATIONS:  [name] is a  47 y.o. female with a diagnosis of [place] who failed conservative measures and elected for surgical management.    The risks benefits and alternatives were discussed with the patient preoperatively including but not limited to the risks of infection, bleeding, nerve injury, cardiopulmonary complications, the need for revision surgery, among others, and the patient was willing to proceed.  OPERATIVE IMPLANTS: Arthrex 5th MT solid screw 4.5 x 50  OPERATIVE FINDINGS: Jones fracture   BLOOD LOSS: min  COMPLICATIONS: none  TOURNIQUET TIME: none  OPERATIVE PROCEDURE:  Patient was identified in the preoperative holding area and site was marked by me She was transported to the operating theater and placed on the table in supine position taking care to pad all bony prominences. After a preincinduction time out anesthesia was induced. The right lower extremity was prepped and draped in normal sterile fashion and a pre-incision timeout was performed. She received ancef for preoperative antibiotics.   K wire was introduced using fluoroscopy and inserted into the intermedullary canal of the 5th MT. Care was taken to cross the fracture. I then made a 66m incision at the pin site.   I used a cannulated drill to open an entry into the canal of the bone.  I sequentialy tapped until I had a good purchase on the bone.   I selected the appropriate sized and length partially threaded screw and  inserted this under xray visualization.   I examined 3 xray views of the foot and was happy with fracture reduction and hardware placement.   Incision was closed and a sterile dressing and splint were applied.   POST OPERATIVE PLAN: NWB for 1-2 wks. Chemical dvt px where appropriate and mobilization for dvt px.

## 2022-12-29 NOTE — Interval H&P Note (Signed)
History and Physical Interval Note:  12/29/2022 10:56 AM  Natasha Cain  has presented today for surgery, with the diagnosis of RIGHT 5TH METATARSAL FRACTURE.  The various methods of treatment have been discussed with the patient and family. After consideration of risks, benefits and other options for treatment, the patient has consented to  Procedure(s): OPEN REDUCTION INTERNAL FIXATION (ORIF) METATARSAL (TOE) FRACTURE (Right) as a surgical intervention.  The patient's history has been reviewed, patient examined, no change in status, stable for surgery.  I have reviewed the patient's chart and labs.  Questions were answered to the patient's satisfaction.     Renette Butters

## 2022-12-29 NOTE — Transfer of Care (Signed)
Immediate Anesthesia Transfer of Care Note  Patient: Natasha Cain  Procedure(s) Performed: OPEN REDUCTION INTERNAL FIXATION (ORIF) METATARSAL (TOE) FRACTURE (Right: Toe)  Patient Location: PACU  Anesthesia Type:General  Level of Consciousness: awake, alert , and oriented  Airway & Oxygen Therapy: Patient Spontanous Breathing and Patient connected to face mask oxygen  Post-op Assessment: Report given to RN and Post -op Vital signs reviewed and stable  Post vital signs: Reviewed and stable  Last Vitals:  Vitals Value Taken Time  BP 103/51 12/29/22 1324  Temp    Pulse 59 12/29/22 1327  Resp 11 12/29/22 1327  SpO2 100 % 12/29/22 1327  Vitals shown include unvalidated device data.  Last Pain:  Vitals:   12/29/22 1034  TempSrc: Oral  PainSc: 0-No pain      Patients Stated Pain Goal: 3 ([id number])  Complications: No notable events documented.

## 2023-01-01 ENCOUNTER — Encounter (HOSPITAL_BASED_OUTPATIENT_CLINIC_OR_DEPARTMENT_OTHER): Payer: Self-pay | Admitting: Orthopedic Surgery

## 2023-01-10 IMAGING — MR MR HEEL *R* W/O CM
4 of 5 series · 21 of 40 positions shown · non-contrast
Comparison: None Available.

CLINICAL DATA: Chronic heel pain

EXAM:
MR OF THE RIGHT HEEL WITHOUT CONTRAST
TECHNIQUE: Multiplanar, multisequence MR imaging of the right heel was
performed. No intravenous contrast was administered.

[Series 12: T2 fat-sat · axial · 3.5mm · 0.33mm/px · z∈[-93,+27]mm · 6 of 37 slices shown (1 of 2)]
[im 1/37]
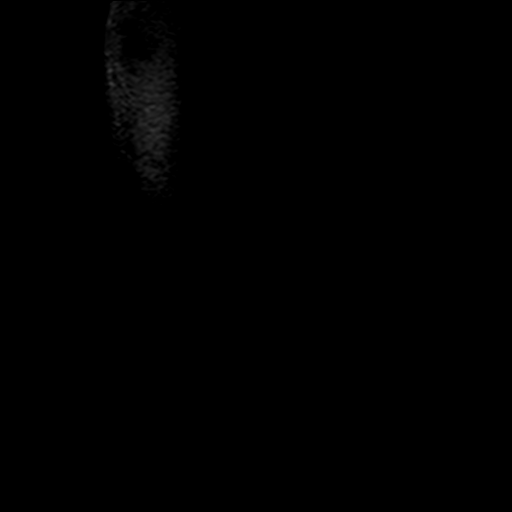
[im 5/37]
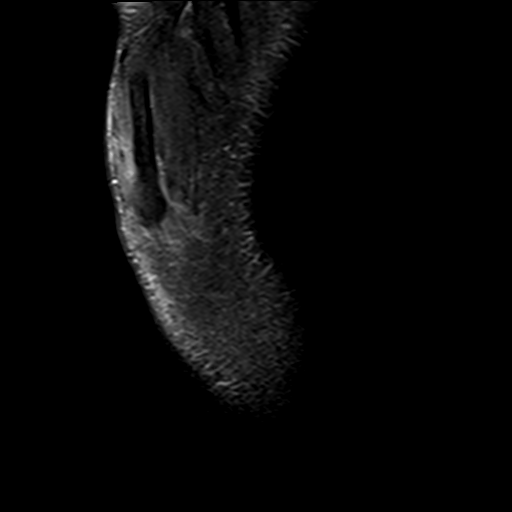
[im 10/37]
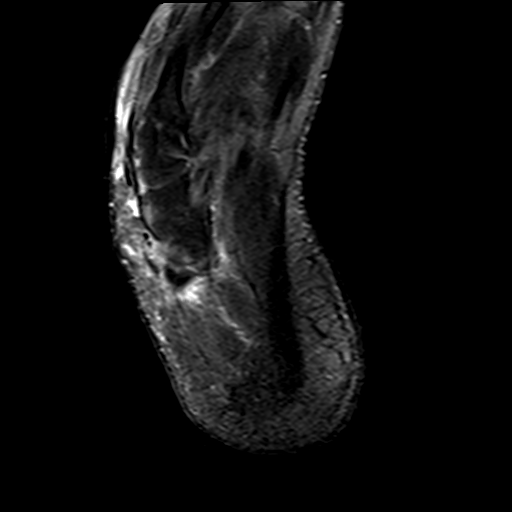
[im 14/37]
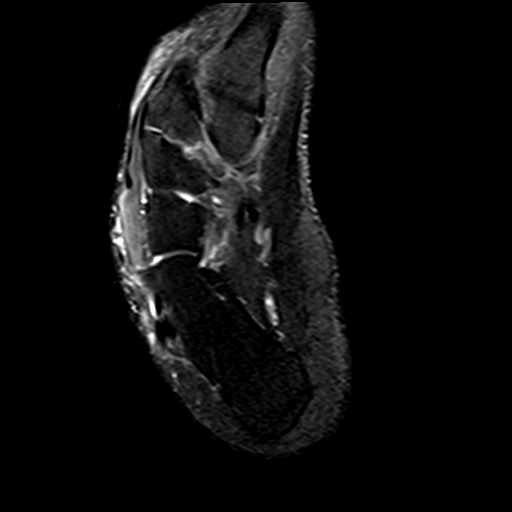
[im 19/37]
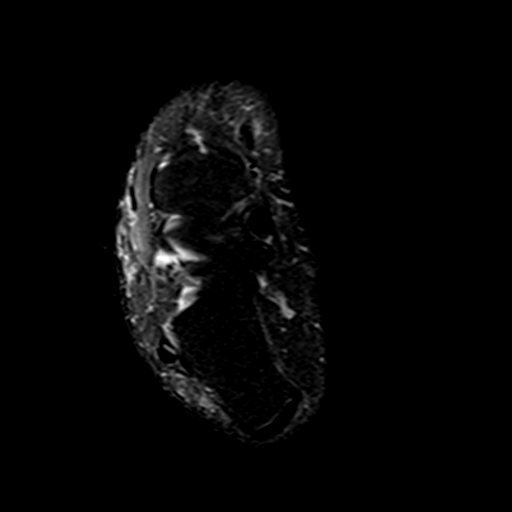
[im 32/37]
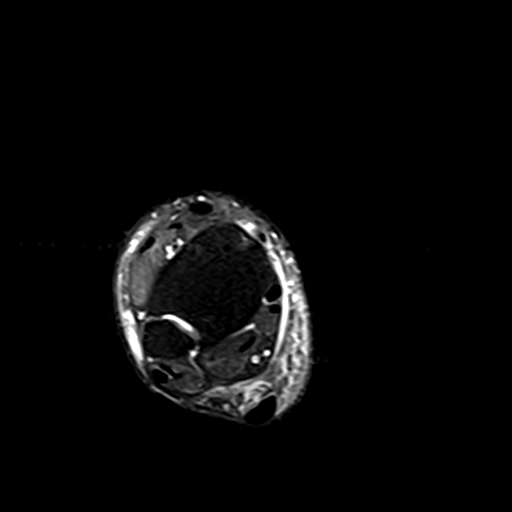

[Series 13: PD fat-sat · axial · 3.5mm · 0.33mm/px · z∈[-93,+46]mm · 9 of 37 slices shown]
[im 1/37]
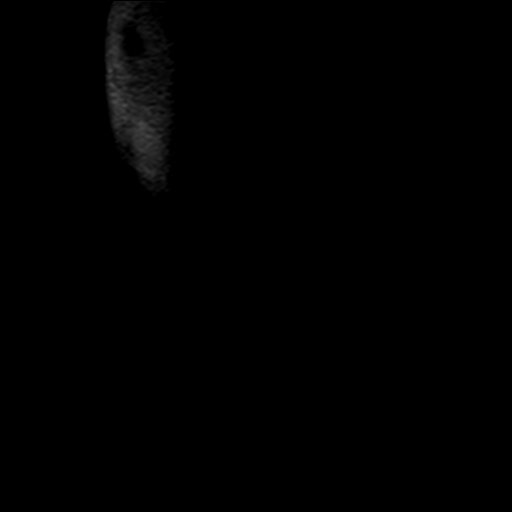
[im 5/37]
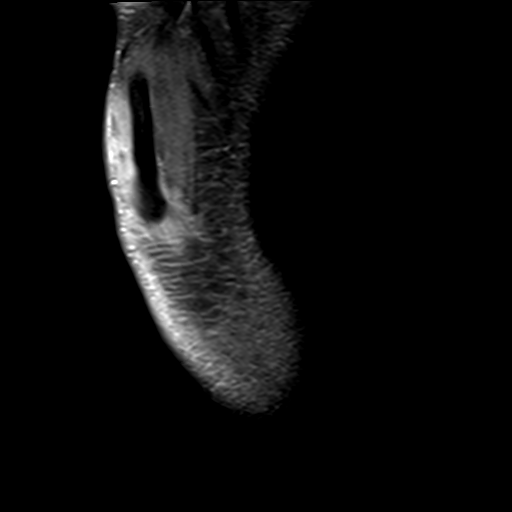
[im 10/37]
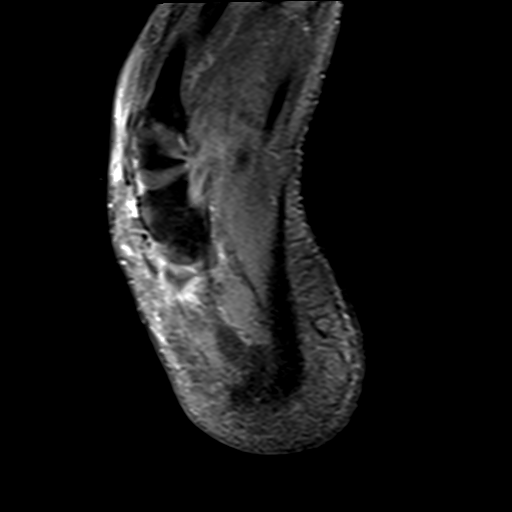
[im 14/37]
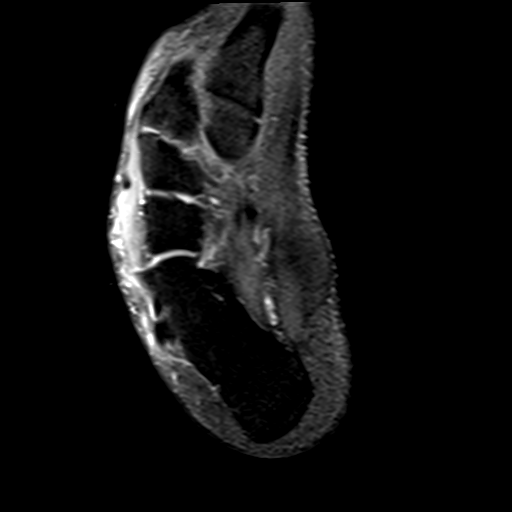
[im 19/37]
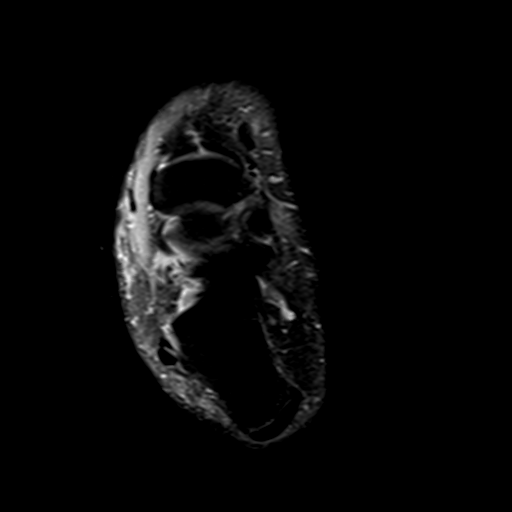
[im 23/37]
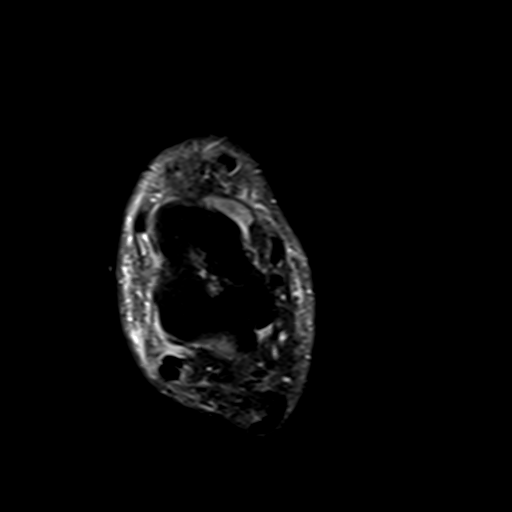
[im 28/37]
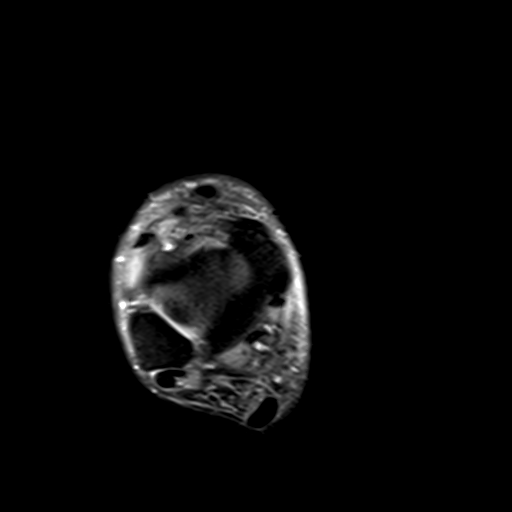
[im 32/37]
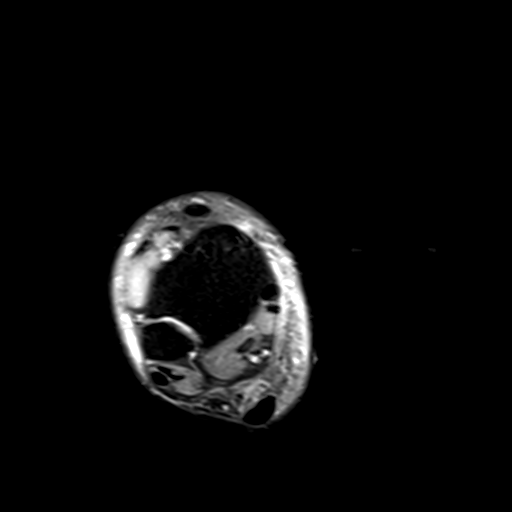
[im 37/37]
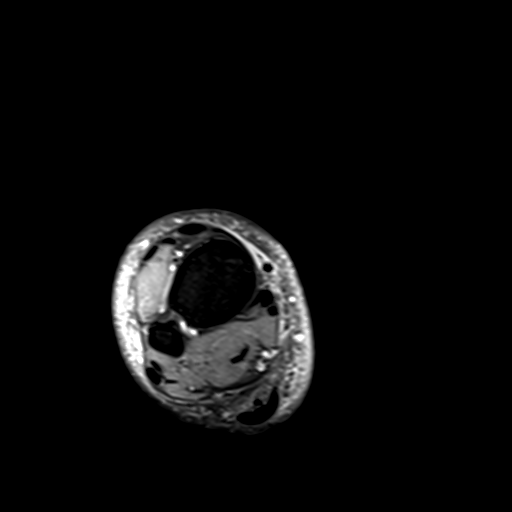

[Series 14: T1 · sagittal · 3.5mm · 0.62mm/px · 3 of 24 slices shown]
[im 5/24]
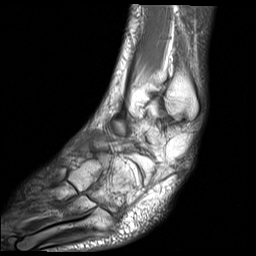
[im 14/24]
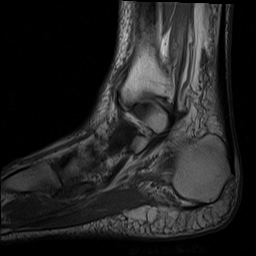
[im 24/24]
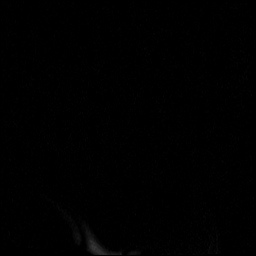

[Series 16: T2 fat-sat · coronal · 3.3mm · 0.33mm/px · 3 of 39 slices shown (2 of 2)]
[im 5/39]
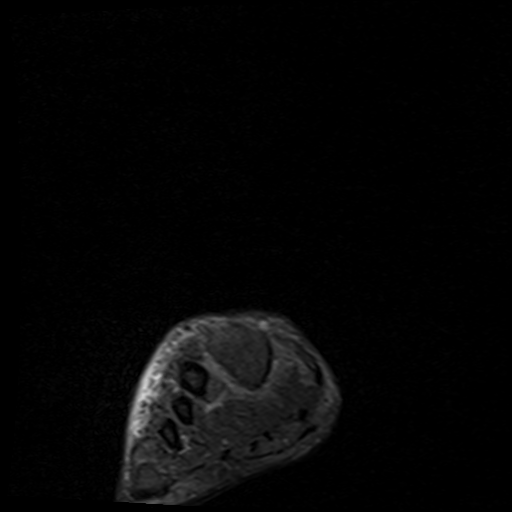
[im 22/39]
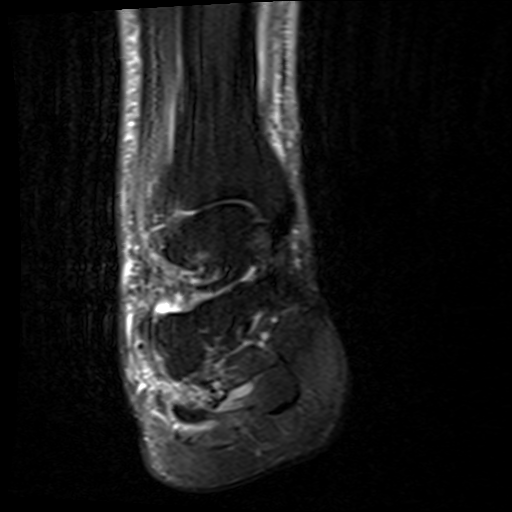
[im 34/39]
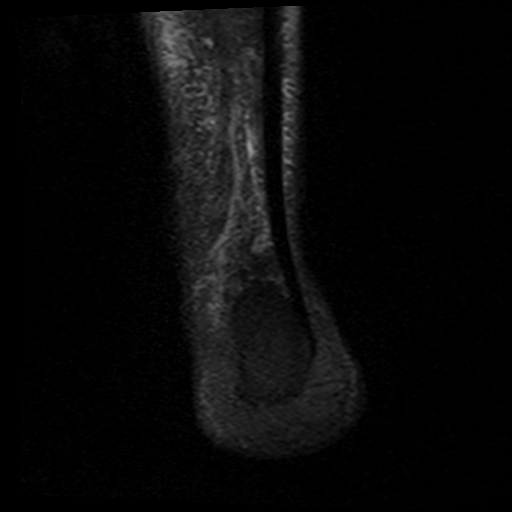

[21 of 40 positions shown; findings below may reference images not displayed]

FINDINGS: TENDONS

Peroneal: Moderate tendinosis of the peroneus longus with a
longitudinal split tear and mild tenosynovitis. Peroneal brevis
intact.

Posteromedial: Posterior tibial tendon intact. Flexor hallucis
longus tendon intact. Flexor digitorum longus tendon intact.

Anterior: Tibialis anterior tendon intact. Extensor hallucis longus
tendon intact Extensor digitorum longus tendon intact.

Achilles:  Intact.

Plantar Fascia: Intact.

LIGAMENTS

Lateral: Anterior talofibular ligament intact. Calcaneofibular
ligament intact. Posterior talofibular ligament intact. Anterior and
posterior tibiofibular ligaments intact.

Medial: Deltoid ligament intact. Spring ligament intact.

CARTILAGE

Ankle Joint: No joint effusion. Normal ankle mortise. No chondral
defect.

Subtalar Joints/Sinus Tarsi: Normal subtalar joints. No subtalar
joint effusion. Normal sinus tarsi.

Bones: No aggressive osseous lesion. No fracture or dislocation.
Mild osteoarthritis the second, third and fourth tarsometatarsal
joints.

Soft Tissue: No fluid collection or hematoma. Muscles are normal
without edema or atrophy. Tarsal tunnel is normal.
IMPRESSION: 1. Moderate tendinosis of the peroneus longus with a longitudinal
split tear and mild tenosynovitis.
2. Mild osteoarthritis the second, third and fourth tarsometatarsal
joints.

## 2023-05-09 ENCOUNTER — Encounter: Payer: Self-pay | Admitting: Orthopaedic Surgery

## 2023-05-09 ENCOUNTER — Other Ambulatory Visit: Payer: Self-pay | Admitting: Orthopaedic Surgery

## 2023-05-09 DIAGNOSIS — M79671 Pain in right foot: Secondary | ICD-10-CM

## 2023-05-09 DIAGNOSIS — M79672 Pain in left foot: Secondary | ICD-10-CM

## 2023-06-01 ENCOUNTER — Other Ambulatory Visit (HOSPITAL_COMMUNITY): Payer: Self-pay | Admitting: Orthopaedic Surgery

## 2023-06-01 DIAGNOSIS — M79671 Pain in right foot: Secondary | ICD-10-CM

## 2023-06-01 DIAGNOSIS — M79672 Pain in left foot: Secondary | ICD-10-CM

## 2023-06-06 ENCOUNTER — Other Ambulatory Visit: Payer: Medicaid Other

## 2023-06-15 ENCOUNTER — Encounter (HOSPITAL_COMMUNITY): Payer: Self-pay

## 2023-06-15 ENCOUNTER — Ambulatory Visit (HOSPITAL_COMMUNITY): Payer: 59

## 2023-06-19 ENCOUNTER — Ambulatory Visit (HOSPITAL_COMMUNITY): Payer: 59

## 2023-07-12 ENCOUNTER — Ambulatory Visit (HOSPITAL_COMMUNITY)
Admission: RE | Admit: 2023-07-12 | Discharge: 2023-07-12 | Disposition: A | Discharge status: Home / Self Care | Payer: Medicaid Other | Source: Ambulatory Visit | Attending: Orthopaedic Surgery | Admitting: Orthopaedic Surgery

## 2023-07-12 ENCOUNTER — Ambulatory Visit (HOSPITAL_COMMUNITY): Payer: Medicaid Other

## 2023-07-12 DIAGNOSIS — M79672 Pain in left foot: Secondary | ICD-10-CM | POA: Diagnosis present

## 2023-07-12 DIAGNOSIS — M79671 Pain in right foot: Secondary | ICD-10-CM | POA: Insufficient documentation

## 2023-08-30 ENCOUNTER — Other Ambulatory Visit (HOSPITAL_COMMUNITY): Payer: Self-pay | Admitting: Nephrology

## 2023-08-30 DIAGNOSIS — N182 Chronic kidney disease, stage 2 (mild): Secondary | ICD-10-CM

## 2023-08-30 DIAGNOSIS — N179 Acute kidney failure, unspecified: Secondary | ICD-10-CM

## 2023-09-07 ENCOUNTER — Ambulatory Visit (HOSPITAL_COMMUNITY)
Admission: RE | Admit: 2023-09-07 | Discharge: 2023-09-07 | Disposition: A | Discharge status: Home / Self Care | Payer: Medicaid Other | Source: Ambulatory Visit | Attending: Nephrology | Admitting: Nephrology

## 2023-09-07 DIAGNOSIS — N179 Acute kidney failure, unspecified: Secondary | ICD-10-CM | POA: Insufficient documentation

## 2023-09-07 DIAGNOSIS — N182 Chronic kidney disease, stage 2 (mild): Secondary | ICD-10-CM | POA: Insufficient documentation

## 2023-09-07 DIAGNOSIS — N189 Chronic kidney disease, unspecified: Secondary | ICD-10-CM | POA: Diagnosis present

## 2023-09-11 ENCOUNTER — Other Ambulatory Visit: Payer: Self-pay | Admitting: Orthopedic Surgery

## 2023-09-11 DIAGNOSIS — M542 Cervicalgia: Secondary | ICD-10-CM

## 2023-09-11 DIAGNOSIS — M545 Low back pain, unspecified: Secondary | ICD-10-CM

## 2023-09-24 ENCOUNTER — Other Ambulatory Visit: Payer: Medicaid Other

## 2023-10-17 ENCOUNTER — Inpatient Hospital Stay: Admission: RE | Admit: 2023-10-17 | Payer: Medicaid Other | Source: Ambulatory Visit

## 2023-10-17 ENCOUNTER — Other Ambulatory Visit: Payer: Medicaid Other

## 2023-10-26 ENCOUNTER — Ambulatory Visit
Admission: RE | Admit: 2023-10-26 | Discharge: 2023-10-26 | Disposition: A | Discharge status: Home / Self Care | Payer: Medicaid Other | Source: Ambulatory Visit | Attending: Orthopedic Surgery | Admitting: Orthopedic Surgery

## 2023-10-26 DIAGNOSIS — M545 Low back pain, unspecified: Secondary | ICD-10-CM

## 2023-10-26 DIAGNOSIS — M542 Cervicalgia: Secondary | ICD-10-CM

## 2023-11-01 ENCOUNTER — Encounter: Payer: Self-pay | Admitting: Orthopedic Surgery

## 2023-11-02 ENCOUNTER — Ambulatory Visit
Admission: RE | Admit: 2023-11-02 | Discharge: 2023-11-02 | Disposition: A | Discharge status: Home / Self Care | Payer: Medicaid Other | Source: Ambulatory Visit | Attending: Orthopedic Surgery | Admitting: Orthopedic Surgery

## 2023-11-15 ENCOUNTER — Encounter: Payer: Self-pay | Admitting: *Deleted

## 2023-11-15 ENCOUNTER — Institutional Professional Consult (permissible substitution): Payer: Medicaid Other | Admitting: Primary Care

## 2023-11-15 NOTE — Progress Notes (Deleted)
@Patient  ID: Natasha Cain, female    DOB: [birth year], 47 y.o.   MRN: [id number]  No chief complaint on file.   Referring provider: Randa Lynn, MD  HPI: 47 year old female, current every day smoker. PMH significant for depression, morbid obesity, anxiety/panic disorder.   11/15/2023 Patient presents today for a sleep consult.      Allergies  Allergen Reactions   Cymbalta [Duloxetine Hcl] Anaphylaxis   Latex Rash   Zorvolex [Diclofenac] Swelling    Stroke like symptoms including tingling in arms and tongue in addition to swelling   Ambien [Zolpidem Tartrate] Other (See Comments)    Hallucinations   Codeine Rash   Darvon [Propoxyphene] Rash    Darvocet   Mobic [Meloxicam] Other (See Comments)    Numbness in arms   Sulfa Antibiotics Hives and Rash     There is no immunization history on file for this patient.  Past Medical History:  Diagnosis Date   Anxiety    Arthritis    Asthma    Complication of anesthesia    patietn wakes up in panic mode   COPD (chronic obstructive pulmonary disease) (HCC)    Depression    Diabetes (HCC)    Hyperlipidemia    Hypertension    Hypothyroidism    PONV (postoperative nausea and vomiting)    Substance abuse (HCC)    Thyroid disease     Tobacco History: Social History   Tobacco Use  Smoking Status Every Day   Current packs/day: 0.50   Types: Cigarettes  Smokeless Tobacco Not on file   Ready to quit: Not Answered Counseling given: Not Answered   Outpatient Medications Prior to Visit  Medication Sig Dispense Refill   acetaminophen (TYLENOL) 500 MG tablet Take 2 tablets (1,000 mg total) by mouth every 6 (six) hours as needed for mild pain or moderate pain. 80 tablet 0   albuterol (VENTOLIN HFA) 108 (90 Base) MCG/ACT inhaler Inhale 2 puffs into the lungs every 6 (six) hours as needed for wheezing or shortness of breath.     amphetamine-dextroamphetamine (ADDERALL) 30 MG tablet Take 20 mg by mouth daily in the  afternoon.     ARIPiprazole (ABILIFY) 5 MG tablet Take 5 mg by mouth daily.     aspirin EC 81 MG tablet Take 1 tablet (81 mg total) by mouth 2 (two) times daily. To prevent blood clots for 30 days after surgery. 60 tablet 0   atenolol (TENORMIN) 50 MG tablet Take 50 mg by mouth daily.     chlorthalidone (HYGROTON) 25 MG tablet Take 25 mg by mouth daily.     cloNIDine (CATAPRES) 0.3 MG tablet Take 0.3 mg by mouth at bedtime.     Fluticasone-Umeclidin-Vilant (TRELEGY ELLIPTA) 100-62.5-25 MCG/ACT AEPB Inhale 1 puff into the lungs daily.     furosemide (LASIX) 20 MG tablet Take 20 mg by mouth daily as needed for edema.     gabapentin (NEURONTIN) 400 MG capsule Take 400 mg by mouth in the morning, at noon, in the evening, and at bedtime.     HYDROcodone-acetaminophen (NORCO) 10-325 MG tablet Take 1 tablet by mouth every 6 (six) hours as needed for severe pain (after surgery that is not resolved by your normal daily dose of Methadone). 28 tablet 0   hydrOXYzine (VISTARIL) 100 MG capsule Take 100 mg by mouth at bedtime.     levothyroxine (SYNTHROID) 125 MCG tablet Take 125 mcg by mouth daily before breakfast.     lidocaine (LIDODERM)  5 % Place 1 patch onto the skin daily. Remove & Discard patch within 12 hours or as directed by MD 30 patch 0   metFORMIN (GLUCOPHAGE) 1000 MG tablet Take 1,000 mg by mouth 2 (two) times daily with a meal.     METHADONE HCL PO Take 120 mg by mouth daily.     Multiple Vitamins-Minerals (MULTIVITAMIN WITH MINERALS) tablet Take 1 tablet by mouth daily.     nystatin (MYCOSTATIN/NYSTOP) powder Apply 1 application. topically daily.     Omega-3 Fatty Acids (FISH OIL) 1000 MG CAPS Take 1,000 mg by mouth daily.     omeprazole (PRILOSEC) 40 MG capsule Take 80 mg by mouth every morning.     ondansetron (ZOFRAN-ODT) 4 MG disintegrating tablet Take 1 tablet (4 mg total) by mouth 2 (two) times daily as needed for nausea or vomiting. 10 tablet 0   Potassium 99 MG TABS Take 198 mg by mouth  daily.     potassium chloride SA (KLOR-CON M) 20 MEQ tablet Take 1 tablet (20 mEq total) by mouth 3 (three) times daily. 15 tablet 0   rOPINIRole (REQUIP) 1 MG tablet Take 1 mg by mouth at bedtime.     sertraline (ZOLOFT) 100 MG tablet Take 200 mg by mouth at bedtime.     tiZANidine (ZANAFLEX) 4 MG tablet Take 4 mg by mouth every 8 (eight) hours as needed for muscle spasms.     topiramate (TOPAMAX) 100 MG tablet Take 100 mg by mouth at bedtime.     VYVANSE 70 MG capsule Take 70 mg by mouth every morning.     zinc gluconate 50 MG tablet Take 50 mg by mouth at bedtime.     No facility-administered medications prior to visit.      Review of Systems  Review of Systems   Physical Exam  LMP  (LMP Unknown)  Physical Exam   Lab Results:  CBC    Component Value Date/Time   WBC 10.8 (H) 03/30/2022 1432   RBC 4.95 03/30/2022 1432   HGB 12.6 11/14/2022 0731   HCT 37.0 11/14/2022 0731   PLT 344 03/30/2022 1432   MCV 79.4 (L) 03/30/2022 1432   MCH 24.8 (L) 03/30/2022 1432   MCHC 31.3 03/30/2022 1432   RDW 15.6 (H) 03/30/2022 1432   LYMPHSABS 4.6 (H) 03/30/2022 1432   MONOABS 0.6 03/30/2022 1432   EOSABS 0.1 03/30/2022 1432   BASOSABS 0.1 03/30/2022 1432    BMET    Component Value Date/Time   NA 137 12/27/2022 1256   K 4.2 12/27/2022 1256   CL 102 12/27/2022 1256   CO2 25 12/27/2022 1256   GLUCOSE 107 (H) 12/27/2022 1256   BUN 14 12/27/2022 1256   CREATININE 1.02 (H) 12/27/2022 1256   CALCIUM 9.0 12/27/2022 1256   GFRNONAA >60 12/27/2022 1256   GFRAA >90 02/08/2014 1858    BNP No results found for: "BNP"  ProBNP No results found for: "PROBNP"  Imaging: MR CERVICAL SPINE WO CONTRAST Result Date: 11/01/2023 CLINICAL DATA:  Initial evaluation for neck pain with radiation into the upper extremities bilaterally for 4 weeks. History of fall. EXAM: MRI CERVICAL SPINE WITHOUT CONTRAST TECHNIQUE: Multiplanar, multisequence MR imaging of the cervical spine was performed. No  intravenous contrast was administered. COMPARISON:  None available. FINDINGS: Alignment: Straightening of the normal cervical lordosis. No significant listhesis. Vertebrae: Vertebral body height maintained without acute or chronic fracture. Bone marrow signal intensity overall within normal limits. No worrisome osseous lesions. No abnormal marrow  edema. Cord: Trace syrinx noted within the lower cervical spinal cord at the level of C7 (series 105, image 29). This measures no more than 1-2 mm in maximal diameter and measures approximately 1 cm in craniocaudad length. Otherwise normal signal morphology. Posterior Fossa, vertebral arteries, paraspinal tissues: Visualized brain and posterior fossa within normal limits. No Chiari malformation. Paraspinous soft tissues within normal limits. Normal flow voids seen within the vertebral arteries bilaterally. Disc levels: C2-C3: Minimal annular disc bulge. Mild right-sided facet hypertrophy. No canal or foraminal stenosis. C3-C4: Minimal annular disc bulge. Mild left-sided facet hypertrophy. No canal or foraminal stenosis. C4-C5: Small central to right paracentral disc protrusion indents the ventral thecal sac (series 105, image 19). Minimal cord flattening without cord signal changes or significant spinal stenosis. Foramina remain widely patent. C5-C6: Mild degenerative intervertebral disc space narrowing. Broad-based posterior disc osteophyte complex flattens and partially effaces the ventral thecal sac. Superimposed small right paracentral disc extrusion with inferior migration (series 105, images 24, 25). Secondary cord flattening without cord signal changes. Mild spinal stenosis. Right worse than left uncovertebral spurring with resultant mild right C6 foraminal stenosis. Left neural foramen remains patent. C6-C7: Shallow central disc protrusion mildly indents the ventral thecal sac (series 105, image 27). No significant spinal stenosis. Foramina remain patent. C7-T1:   Unremarkable. Mild noncompressive disc bulging noted within the upper thoracic spine at T2-3 and T3-4 without significant stenosis. IMPRESSION: 1. Small right paracentral disc extrusion with inferior migration at C5-6 with resultant mild spinal stenosis, with mild right C6 foraminal narrowing. 2. Small central to right paracentral disc protrusion at C4-5 without stenosis or impingement. 3. Shallow central disc protrusion at C6-7 without significant stenosis or neural impingement. 4. 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 Electronically Signed   By: Rise Mu M.D.   On: 11/01/2023 18:26     Assessment & Plan:   No problem-specific Assessment & Plan notes found for this encounter.     Glenford Bayley, NP 11/15/2023

## 2023-12-13 ENCOUNTER — Institutional Professional Consult (permissible substitution): Payer: Medicaid Other | Admitting: Adult Health

## 2023-12-14 ENCOUNTER — Institutional Professional Consult (permissible substitution): Payer: Medicaid Other | Admitting: Nurse Practitioner

## 2024-01-06 ENCOUNTER — Other Ambulatory Visit: Payer: Self-pay

## 2024-01-06 ENCOUNTER — Emergency Department (HOSPITAL_COMMUNITY)
Admission: EM | Admit: 2024-01-06 | Discharge: 2024-01-06 | Disposition: A | Discharge status: Home / Self Care | Payer: Medicaid Other | Attending: Emergency Medicine | Admitting: Emergency Medicine

## 2024-01-06 ENCOUNTER — Encounter (HOSPITAL_COMMUNITY): Payer: Self-pay

## 2024-01-06 ENCOUNTER — Emergency Department (HOSPITAL_COMMUNITY): Payer: Medicaid Other

## 2024-01-06 DIAGNOSIS — R1030 Lower abdominal pain, unspecified: Secondary | ICD-10-CM | POA: Insufficient documentation

## 2024-01-06 DIAGNOSIS — N183 Chronic kidney disease, stage 3 unspecified: Secondary | ICD-10-CM | POA: Insufficient documentation

## 2024-01-06 DIAGNOSIS — R112 Nausea with vomiting, unspecified: Secondary | ICD-10-CM | POA: Diagnosis present

## 2024-01-06 DIAGNOSIS — E876 Hypokalemia: Secondary | ICD-10-CM | POA: Insufficient documentation

## 2024-01-06 DIAGNOSIS — M549 Dorsalgia, unspecified: Secondary | ICD-10-CM | POA: Diagnosis not present

## 2024-01-06 LAB — BASIC METABOLIC PANEL
Anion gap: 17 — ABNORMAL HIGH (ref 5–15)
BUN: 37 mg/dL — ABNORMAL HIGH (ref 6–20)
CO2: 27 mmol/L (ref 22–32)
Calcium: 9.4 mg/dL (ref 8.9–10.3)
Chloride: 91 mmol/L — ABNORMAL LOW (ref 98–111)
Creatinine, Ser: 2.03 mg/dL — ABNORMAL HIGH (ref 0.44–1.00)
GFR, Estimated: 30 mL/min — ABNORMAL LOW (ref >60)
Glucose, Bld: 117 mg/dL — ABNORMAL HIGH (ref 70–99)
Potassium: 2.7 mmol/L — CL (ref 3.5–5.1)
Sodium: 135 mmol/L (ref 135–145)

## 2024-01-06 LAB — URINALYSIS, ROUTINE W REFLEX MICROSCOPIC
Bilirubin Urine: NEGATIVE
Glucose, UA: NEGATIVE mg/dL
Hgb urine dipstick: NEGATIVE
Ketones, ur: NEGATIVE mg/dL
Nitrite: NEGATIVE
Protein, ur: NEGATIVE mg/dL
Specific Gravity, Urine: 1.013 (ref 1.005–1.030)
pH: 6 (ref 5.0–8.0)

## 2024-01-06 LAB — COMPREHENSIVE METABOLIC PANEL
ALT: 23 U/L (ref 0–44)
AST: 32 U/L (ref 15–41)
Albumin: 4.1 g/dL (ref 3.5–5.0)
Alkaline Phosphatase: 61 U/L (ref 38–126)
Anion gap: 18 — ABNORMAL HIGH (ref 5–15)
BUN: 34 mg/dL — ABNORMAL HIGH (ref 6–20)
CO2: 29 mmol/L (ref 22–32)
Calcium: 9.9 mg/dL (ref 8.9–10.3)
Chloride: 88 mmol/L — ABNORMAL LOW (ref 98–111)
Creatinine, Ser: 1.96 mg/dL — ABNORMAL HIGH (ref 0.44–1.00)
GFR, Estimated: 31 mL/min — ABNORMAL LOW (ref >60)
Glucose, Bld: 98 mg/dL (ref 70–99)
Potassium: 2.4 mmol/L — CL (ref 3.5–5.1)
Sodium: 135 mmol/L (ref 135–145)
Total Bilirubin: 0.8 mg/dL (ref 0.0–1.2)
Total Protein: 8.1 g/dL (ref 6.5–8.1)

## 2024-01-06 LAB — CBC
HCT: 42.4 % (ref 36.0–46.0)
Hemoglobin: 15.1 g/dL — ABNORMAL HIGH (ref 12.0–15.0)
MCH: 30.3 pg (ref 26.0–34.0)
MCHC: 35.6 g/dL (ref 30.0–36.0)
MCV: 85.1 fL (ref 80.0–100.0)
Platelets: 432 10*3/uL — ABNORMAL HIGH (ref 150–400)
RBC: 4.98 MIL/uL (ref 3.87–5.11)
RDW: 13.9 % (ref 11.5–15.5)
WBC: 11.2 10*3/uL — ABNORMAL HIGH (ref 4.0–10.5)
nRBC: 0 % (ref 0.0–0.2)

## 2024-01-06 LAB — MAGNESIUM: Magnesium: 2 mg/dL (ref 1.7–2.4)

## 2024-01-06 LAB — PREGNANCY, URINE: Preg Test, Ur: NEGATIVE

## 2024-01-06 LAB — LIPASE, BLOOD: Lipase: 46 U/L (ref 11–51)

## 2024-01-06 MED ORDER — ONDANSETRON HCL 4 MG PO TABS: 4.0000 mg | ORAL_TABLET | Freq: Three times a day (TID) | ORAL | Status: DC | PRN

## 2024-01-06 MED ORDER — METHADONE HCL 10 MG PO TABS
130.0000 mg | ORAL_TABLET | Freq: Once | ORAL | Status: AC
  Administered 2024-01-06: 130 mg via ORAL
  Filled 2024-01-06: qty 13

## 2024-01-06 MED ORDER — ONDANSETRON 4 MG PO TBDP
8.0000 mg | ORAL_TABLET | Freq: Once | ORAL | Status: AC
  Administered 2024-01-06: 8 mg via ORAL
  Filled 2024-01-06: qty 2

## 2024-01-06 MED ORDER — POTASSIUM CHLORIDE CRYS ER 20 MEQ PO TBCR: 20.0000 meq | EXTENDED_RELEASE_TABLET | Freq: Two times a day (BID) | ORAL | 0 refills | Status: AC

## 2024-01-06 MED ORDER — POTASSIUM CHLORIDE 10 MEQ/100ML IV SOLN
10.0000 meq | INTRAVENOUS | Status: AC
  Administered 2024-01-06 (×2): 10 meq via INTRAVENOUS
  Filled 2024-01-06 (×2): qty 100

## 2024-01-06 MED ORDER — CEPHALEXIN 500 MG PO CAPS: 500.0000 mg | ORAL_CAPSULE | Freq: Four times a day (QID) | ORAL | 0 refills | Status: AC

## 2024-01-06 MED ORDER — POTASSIUM CHLORIDE 20 MEQ PO PACK
40.0000 meq | PACK | ORAL | Status: AC
  Administered 2024-01-06 (×2): 40 meq via ORAL
  Filled 2024-01-06 (×2): qty 2

## 2024-01-06 NOTE — ED Provider Notes (Signed)
[place] EMERGENCY DEPARTMENT AT [facility] Provider Note  Arrival date/time:01/06/2024 10:12 PM  HPI/ROS   Natasha Cain is a 48 y.o. female with PMH significant for CKD stage III, chronic back pain with multiple back surgeries was quite low, and multiple episodes of emesis as well as abdominal cramping. History is provided by patient.  Patient endorses that she ran out of her Zofran approximately 1 week ago and has had a few episodes of emesis over the past 2 days and has been unable to keep down her methadone dosing for the past 2 days.  She is now having increasing pain all over her body but it is most noticeable in her back and abdomen, which are chronic for her.  She has been recently worked up for her back pain by her spinal surgery team and had an a month and a half ago.  They have a plan to repeat spinal surgery for her. She does typically take Zofran every day for nausea and endorses that when she runs out, it is typical for her to have abdominal cramping and emesis.  She believes that her symptoms are likely in the setting of running out of her Zofran and being unable to keep down her methadone for the past 2 days and is asking for refill for Zofran and a one-time dose of her methadone here.  She endorses that her abdominal pain is mostly suprapubic in nature which is typical for her.  She does endorse some burning on urination.  A complete ROS was performed with pertinent positives/negatives noted above.   ED Course and Medical Decision Making   I personally reviewed the patient's vitals.  Assessment/Plan: 48 year old patient with history of CKD stage III, chronic back pain with multiple prior surgeries who is presenting for abdominal cramping, emesis and persistent back pain.  She recently had MRI imaging of her spine in December 2024 with her spine team. He endorses no new back pain abdomen since that time.  She also endorses that her abdominal cramping and emesis  are typical symptoms when she is unable to take her Zofran and methadone.  Given that she does have CKD stage III, we discussed the possibility of obtaining CT we decided to obtain a CT noncontrasted imaging due to her poor kidney function. CT showed moderate colonic stool burden, however no acute emergent pathology.  Her lab workup shows concern for severe hypokalemia to 2.4, which was repleted orally and IV here in the emergency department.  Otherwise creatinine is elevated 1.96.  Magnesium within normal limits Her CBC shows no significant leukocytosis, mild increase in white blood count to 11.2.  No anemia.  Lipase is within normal limits.  Her urinalysis shows positive leukocyte Estrace and rare bacteria.  Although this is not overtly convincing for UTI, patient is experiencing some dysuria and so I think would be reasonable to treat for UTI as well as send urine culture.  On discussion with patient, she would prefer not to be admitted to the hospital given that she has a daughter at home and does not want to stay here overnight.  Using shared decision making we discussed repeating her potassium levels to eval for improvement following IV and oral potassium administration. Patient was given a total of 40 mEq of oral potassium as well as 10 mEq of IV potassium x 2. Her repeat potassium is only 2.7. On her initial EKG her QTc interval was prolonged and on a repeat after obtaining potassium, her QTc is  still 550.  I discussed with patient that it may be safer for her to stay in the hospital due to her hypokalemia.  However she is adamant that she does not want to stay the night and would prefer to go home with her daughter.  We used shared decision making and came up with a plan for patient to be safely discharged home. I prescribed her potassium to be taken as 20 mEq twice daily for the next 4 to 5 days and instruct her to closely follow-up with her PCP or nephrologist to repeat potassium this  week.  She says she does have an appointment with her nephrologist this week and will follow-up then.  I also refilled her prescription for Zofran and also prescribed her Keflex for her possible UTI.  I advised patient to return to the emergency department if she had any additional concerns and she voiced that she would.   Disposition:  I discussed the plan for discharge with the patient and/or their surrogate at bedside prior to discharge and they were in agreement with the plan and verbalized understanding of the return precautions provided. All questions answered to the best of my ability. Ultimately, the patient was discharged in stable condition with stable vital signs. I am reassured that they are capable of close follow up and good social support at home.   Clinical Impression:  1. Nausea and vomiting, unspecified vomiting type   2. Hypokalemia     Rx / DC Orders ED Discharge Orders          Ordered    potassium chloride SA (KLOR-CON M) 20 MEQ tablet  2 times daily        01/06/24 2140    ondansetron (ZOFRAN) 4 MG tablet  Every 8 hours PRN        01/06/24 2140    cephALEXin (KEFLEX) 500 MG capsule  4 times daily        01/06/24 2140            The plan for this patient was discussed with Dr. Renaye Rakers, who voiced agreement and who oversaw evaluation and treatment of this patient.   Clinical Complexity A medically appropriate history, review of systems, and physical exam was performed.  If decision rules were used in this patient's evaluation, they are listed below.   Click here for ABCD2, HEART and other calculatorsREFRESH Note before signing   Patient's presentation is most consistent with acute complicated illness / injury requiring diagnostic workup.  Medical Decision Making Amount and/or Complexity of Data Reviewed Labs: ordered. Decision-making details documented in ED Course. Radiology: ordered. ECG/medicine tests: ordered.  Risk Prescription drug  management.    Physical Exam and Medical History   Vitals:   01/06/24 1431 01/06/24 1436 01/06/24 1830 01/06/24 1958  BP:  111/87  109/71  Pulse:  71  65  Resp:  16  17  Temp:  97.6 F (36.4 C) 97.9 F (36.6 C) 97.8 F (36.6 C)  TempSrc:   Oral Oral  SpO2:  99%  100%  Weight: 119.3 kg     Height: 5\' 5"  (1.651 m)       Physical Exam Vitals and nursing note reviewed.  Constitutional:      Appearance: She is well-developed.     Comments: Tearful on exam  HENT:     Head: Normocephalic and atraumatic.  Eyes:     Conjunctiva/sclera: Conjunctivae normal.  Cardiovascular:     Rate and Rhythm: Normal rate and regular  rhythm.     Heart sounds: No murmur heard. Abdominal:     Palpations: Abdomen is soft.     Tenderness: There is no abdominal tenderness.  Musculoskeletal:        General: No swelling.     Cervical back: Neck supple.  Skin:    General: Skin is warm and dry.     Capillary Refill: Capillary refill takes less than 2 seconds.  Neurological:     Mental Status: She is alert.  Psychiatric:        Mood and Affect: Mood normal.     Medical History: Allergies  Allergen Reactions   Cymbalta [Duloxetine Hcl] Anaphylaxis   Latex Rash   Zorvolex [Diclofenac] Swelling    Stroke like symptoms including tingling in arms and tongue in addition to swelling   Ambien [Zolpidem Tartrate] Other (See Comments)    Hallucinations   Codeine Rash   Darvon [Propoxyphene] Rash    Darvocet   Mobic [Meloxicam] Other (See Comments)    Numbness in arms   Sulfa Antibiotics Hives and Rash   Past Medical History:  Diagnosis Date   Anxiety    Arthritis    Asthma    Complication of anesthesia    patietn wakes up in panic mode   COPD (chronic obstructive pulmonary disease) (HCC)    Depression    Diabetes (HCC)    Hyperlipidemia    Hypertension    Hypothyroidism    PONV (postoperative nausea and vomiting)    Substance abuse (HCC)    Thyroid disease     Past Surgical  History:  Procedure Laterality Date   BACK SURGERY     CESAREAN SECTION  05/22/2007   CHOLECYSTECTOMY  11/21/2007   FOOT SURGERY Bilateral    INCISIONAL HERNIA REPAIR N/A 04/03/2022   Procedure: HERNIA REPAIR INCISIONAL W/ MESH;  Surgeon: Franky Macho, MD;  Location: [facility];  Service: General;  Laterality: N/A;   KNEE SURGERY  07/22/2011   left   ORIF TOE FRACTURE Left 11/14/2022   Procedure: OPEN REDUCTION INTERNAL FIXATION (ORIF) METATARSAL (TOE) FRACTURE;  Surgeon: Sheral Apley, MD;  Location: [facility];  Service: Orthopedics;  Laterality: Left;   ORIF TOE FRACTURE Right 12/29/2022   Procedure: OPEN REDUCTION INTERNAL FIXATION (ORIF) METATARSAL (TOE) FRACTURE;  Surgeon: Sheral Apley, MD;  Location: [facility];  Service: Orthopedics;  Laterality: Right;   SPINE SURGERY  05/15/2012   Family History  Problem Relation Age of Onset   Cancer Mother        breast   Cancer Maternal Aunt        skin cancer on face   Cancer Paternal Uncle        colon   Cancer Maternal Grandfather        lung   Cancer Paternal Grandfather        colon   Cancer Cousin        colon    Social History   Tobacco Use   Smoking status: Every Day    Current packs/day: 0.50    Types: Cigarettes  Substance Use Topics   Alcohol use: No   Drug use: No    Procedures   If procedures were preformed on this patient, they are listed below:  Procedures   -------- HPI and MDM generated using voice dictation software and may contain dictation errors. Please contact me for any clarification or with any questions.   Cephus Slater, MD Emergency Medicine  PGY-2    Caron Presume, MD 01/06/24 2212    Terald Sleeper, MD 01/07/24 1053

## 2024-01-06 NOTE — ED Notes (Signed)
 ED Provider at bedside.

## 2024-01-06 NOTE — ED Provider Triage Note (Signed)
Emergency Medicine Provider Triage Evaluation Note  Natasha Cain , a 48 y.o. female  was evaluated in triage.  Pt complains of vomiting.  Pt complains of back pain.  Pt reports she vomited up methadone dose yesterday and today   Review of Systems  Positive: Vomiting and back pain Negative: fever  Physical Exam  BP 111/87 (BP Location: Right Arm)   Pulse 71   Temp 97.6 F (36.4 C)   Resp 16   Ht 5\' 5"  (1.651 m)   Wt 119.3 kg   LMP  (LMP Unknown)   SpO2 99%   BMI 43.77 kg/m  Gen:   Awake, no distress   Resp:  Normal effort  MSK:   Moves extremities without difficulty  Other:    Medical Decision Making  Medically screening exam initiated at 2:38 PM.  Appropriate orders placed.  Natasha Cain was informed that the remainder of the evaluation will be completed by another provider, this initial triage assessment does not replace that evaluation, and the importance of remaining in the ED until their evaluation is complete.     Natasha Cain, [place] 01/06/24 1442

## 2024-01-06 NOTE — Discharge Instructions (Addendum)
Natasha Cain:  Thank you for allowing [place] to take care of you today.  We hope you begin feeling better soon.  To-Do: Please follow-up closely with your PCP doctors to have your potassium repleted in the next few days. We prescribed you potassium tablets, please take 1 tablet ( ) twice a day for at least the next 4 days or until you can have your potassium rechecked. We sent a prescription for home Zofran. We sent a prescription for an antibiotic to treat a possible urinary tract infection. Please return to the Emergency Department or call 911 if you experience chest pain, shortness of breath, severe pain, severe fever, altered mental status, or have any reason to think that you need emergency medical care.  Thank you again.  Hope you feel better soon.  Department of Emergency Medicine [facility]

## 2024-01-06 NOTE — ED Triage Notes (Signed)
Pt c/o RLQ, LLQ abdominal pain, N/V started last night. Pt c/o worse lower back pain and states she vomited her last two doses of methadone.

## 2024-01-08 LAB — URINE CULTURE: Culture: <10000 — AB

## 2024-01-14 ENCOUNTER — Ambulatory Visit: Payer: Medicaid Other | Admitting: Gastroenterology

## 2024-01-14 NOTE — Progress Notes (Deleted)
 HPI :     Lab Results  Component Value Date   WBC 11.2 (H) 01/06/2024   HGB 15.1 (H) 01/06/2024   HCT 42.4 01/06/2024   MCV 85.1 01/06/2024   PLT 432 (H) 01/06/2024    No results found for: "IRON", "TIBC", "FERRITIN"    CT abdomen / pelvis 01/06/2024: IMPRESSION: 1. No acute abnormality in the abdomen/pelvis. 2. Hepatomegaly with hepatic steatosis. 3. Moderate colonic stool burden.   CT abdomen / pelvis 09/23/2021: 1. Small umbilical hernia containing fat. 2. Hepatomegaly with evidence of hepatic steatosis. 3. Small right adrenal gland nodule. Consider follow-up noncontrast CT abdomen or washout study. 4. Mild colonic diverticulosis.      Past Medical History:  Diagnosis Date   Anxiety    Arthritis    Asthma    Complication of anesthesia    patietn wakes up in panic mode   COPD (chronic obstructive pulmonary disease) (HCC)    Depression    Diabetes (HCC)    Hyperlipidemia    Hypertension    Hypothyroidism    PONV (postoperative nausea and vomiting)    Substance abuse (HCC)    Thyroid disease      Past Surgical History:  Procedure Laterality Date   BACK SURGERY     CESAREAN SECTION  05/22/2007   CHOLECYSTECTOMY  11/21/2007   FOOT SURGERY Bilateral    INCISIONAL HERNIA REPAIR N/A 04/03/2022   Procedure: HERNIA REPAIR INCISIONAL W/ MESH;  Surgeon: Franky Macho, MD;  Location: [facility];  Service: General;  Laterality: N/A;   KNEE SURGERY  07/22/2011   left   ORIF TOE FRACTURE Left 11/14/2022   Procedure: OPEN REDUCTION INTERNAL FIXATION (ORIF) METATARSAL (TOE) FRACTURE;  Surgeon: Sheral Apley, MD;  Location: [facility] [place];  Service: Orthopedics;  Laterality: Left;   ORIF TOE FRACTURE Right 12/29/2022   Procedure: OPEN REDUCTION INTERNAL FIXATION (ORIF) METATARSAL (TOE) FRACTURE;  Surgeon: Sheral Apley, MD;  Location: [facility];  Service: Orthopedics;  Laterality: Right;   SPINE SURGERY  05/15/2012   Family  History  Problem Relation Age of Onset   Cancer Mother        breast   Cancer Maternal Aunt        skin cancer on face   Cancer Paternal Uncle        colon   Cancer Maternal Grandfather        lung   Cancer Paternal Grandfather        colon   Cancer Cousin        colon   Social History   Tobacco Use   Smoking status: Every Day    Current packs/day: 0.50    Types: Cigarettes  Substance Use Topics   Alcohol use: No   Drug use: No   Current Outpatient Medications  Medication Sig Dispense Refill   acetaminophen (TYLENOL) 500 MG tablet Take 2 tablets (1,000 mg total) by mouth every 6 (six) hours as needed for mild pain or moderate pain. 80 tablet 0   albuterol (VENTOLIN HFA) 108 (90 Base) MCG/ACT inhaler Inhale 2 puffs into the lungs every 6 (six) hours as needed for wheezing or shortness of breath.     amphetamine-dextroamphetamine (ADDERALL) 30 MG tablet Take 20 mg by mouth daily in the afternoon.     ARIPiprazole (ABILIFY) 5 MG tablet Take 5 mg by mouth daily.     aspirin EC 81 MG tablet Take 1 tablet (81 mg total) by mouth 2 (  two) times daily. To prevent blood clots for 30 days after surgery. 60 tablet 0   atenolol (TENORMIN) 50 MG tablet Take 50 mg by mouth daily.     cephALEXin (KEFLEX) 500 MG capsule Take 1 capsule (500 mg total) by mouth 4 (four) times daily. 20 capsule 0   chlorthalidone (HYGROTON) 25 MG tablet Take 25 mg by mouth daily.     cloNIDine (CATAPRES) 0.3 MG tablet Take 0.3 mg by mouth at bedtime.     Fluticasone-Umeclidin-Vilant (TRELEGY ELLIPTA) 100-62.5-25 MCG/ACT AEPB Inhale 1 puff into the lungs daily.     furosemide (LASIX) 20 MG tablet Take 20 mg by mouth daily as needed for edema.     gabapentin (NEURONTIN) 400 MG capsule Take 400 mg by mouth in the morning, at noon, in the evening, and at bedtime.     HYDROcodone-acetaminophen (NORCO) 10-325 MG tablet Take 1 tablet by mouth every 6 (six) hours as needed for severe pain (after surgery that is not resolved  by your normal daily dose of Methadone). 28 tablet 0   hydrOXYzine (VISTARIL) 100 MG capsule Take 100 mg by mouth at bedtime.     levothyroxine (SYNTHROID) 125 MCG tablet Take 125 mcg by mouth daily before breakfast.     lidocaine (LIDODERM) 5 % Place 1 patch onto the skin daily. Remove & Discard patch within 12 hours or as directed by MD 30 patch 0   metFORMIN (GLUCOPHAGE) 1000 MG tablet Take 1,000 mg by mouth 2 (two) times daily with a meal.     METHADONE HCL PO Take 120 mg by mouth daily.     Multiple Vitamins-Minerals (MULTIVITAMIN WITH MINERALS) tablet Take 1 tablet by mouth daily.     nystatin (MYCOSTATIN/NYSTOP) powder Apply 1 application. topically daily.     Omega-3 Fatty Acids (FISH OIL) 1000 MG CAPS Take 1,000 mg by mouth daily.     omeprazole (PRILOSEC) 40 MG capsule Take 80 mg by mouth every morning.     ondansetron (ZOFRAN) 4 MG tablet Take 1 tablet (4 mg total) by mouth every 8 (eight) hours as needed for nausea or vomiting.     ondansetron (ZOFRAN-ODT) 4 MG disintegrating tablet Take 1 tablet (4 mg total) by mouth 2 (two) times daily as needed for nausea or vomiting. 10 tablet 0   Potassium 99 MG TABS Take 198 mg by mouth daily.     potassium chloride SA (KLOR-CON M) 20 MEQ tablet Take 1 tablet (20 mEq total) by mouth 3 (three) times daily. 15 tablet 0   potassium chloride SA (KLOR-CON M) 20 MEQ tablet Take 1 tablet (20 mEq total) by mouth 2 (two) times daily. 30 tablet 0   rOPINIRole (REQUIP) 1 MG tablet Take 1 mg by mouth at bedtime.     sertraline (ZOLOFT) 100 MG tablet Take 200 mg by mouth at bedtime.     tiZANidine (ZANAFLEX) 4 MG tablet Take 4 mg by mouth every 8 (eight) hours as needed for muscle spasms.     topiramate (TOPAMAX) 100 MG tablet Take 100 mg by mouth at bedtime.     VYVANSE 70 MG capsule Take 70 mg by mouth every morning.     zinc gluconate 50 MG tablet Take 50 mg by mouth at bedtime.     No current facility-administered medications for this visit.    Allergies  Allergen Reactions   Cymbalta [Duloxetine Hcl] Anaphylaxis   Latex Rash   Zorvolex [Diclofenac] Swelling    Stroke like symptoms including tingling  in arms and tongue in addition to swelling   Ambien [Zolpidem Tartrate] Other (See Comments)    Hallucinations   Codeine Rash   Darvon [Propoxyphene] Rash    Darvocet   Mobic [Meloxicam] Other (See Comments)    Numbness in arms   Sulfa Antibiotics Hives and Rash     Review of Systems: All systems reviewed and negative except where noted in HPI.    CT ABDOMEN PELVIS WO CONTRAST Result Date: 01/06/2024 CLINICAL DATA:  Acute abdominal pain.  Lower abdominal pain. EXAM: CT ABDOMEN AND PELVIS WITHOUT CONTRAST TECHNIQUE: Multidetector CT imaging of the abdomen and pelvis was performed following the standard protocol without IV contrast. RADIATION DOSE REDUCTION: This exam was performed according to the departmental dose-optimization program which includes automated exposure control, adjustment of the mA and/or kV according to patient size and/or use of iterative reconstruction technique. COMPARISON:  Report from CT 09/23/2021, images not available. FINDINGS: Lower chest: Clear lung bases. Hepatobiliary: Enlarged liver spanning 20.8 cm cranial caudal with diffuse hepatic steatosis. No evidence of focal liver lesion. Clips in the gallbladder fossa postcholecystectomy. No biliary dilatation. Pancreas: No ductal dilatation or inflammation. Spleen: Normal in size without focal abnormality. Adrenals/Urinary Tract: 11 mm right adrenal nodule with Hounsfield units of 16, also described on prior exam. Given stability, no further follow-up imaging is needed. Normal left adrenal gland. No hydronephrosis or renal calculi. No evidence of suspicious renal lesion on this unenhanced exam. Decompressed urinary bladder, normal for degree of distension. Stomach/Bowel: Normal appendix, for example series 3, image 46. No bowel obstruction or inflammation.  Moderate volume of colonic stool. Vascular/Lymphatic: Normal caliber abdominal aorta. No enlarged lymph nodes in the abdomen or pelvis. Reproductive: Uterus and bilateral adnexa are unremarkable. Other: No free air, free fluid, or intra-abdominal fluid collection. No abdominal wall or inguinal hernia. Musculoskeletal: Posterior L4-S1 fusion. L3-L4 degenerative disc disease. Remote right rib fracture. There are no acute or suspicious osseous abnormalities. IMPRESSION: 1. No acute abnormality in the abdomen/pelvis. 2. Hepatomegaly with hepatic steatosis. 3. Moderate colonic stool burden. Electronically Signed   By: Narda Rutherford M.D.   On: 01/06/2024 17:46    Physical Exam: LMP  (LMP Unknown)  Constitutional: Pleasant,well-developed, ***female in no acute distress. HEENT: Normocephalic and atraumatic. Conjunctivae are normal. No scleral icterus. Neck supple.  Cardiovascular: Normal rate, regular rhythm.  Pulmonary/chest: Effort normal and breath sounds normal. No wheezing, rales or rhonchi. Abdominal: Soft, nondistended, nontender. Bowel sounds active throughout. There are no masses palpable. No hepatomegaly. Extremities: no edema Lymphadenopathy: No cervical adenopathy noted. Neurological: Alert and oriented to person place and time. Skin: Skin is warm and dry. No rashes noted. Psychiatric: Normal mood and affect. Behavior is normal.   ASSESSMENT: 48 y.o. female here for assessment of the following  No diagnosis found.  PLAN:   Johnn Hai, NP

## 2024-02-06 ENCOUNTER — Other Ambulatory Visit (HOSPITAL_COMMUNITY)
Admission: RE | Admit: 2024-02-06 | Discharge: 2024-02-06 | Disposition: A | Discharge status: Home / Self Care | Source: Ambulatory Visit | Attending: Nephrology | Admitting: Nephrology

## 2024-02-06 DIAGNOSIS — N184 Chronic kidney disease, stage 4 (severe): Secondary | ICD-10-CM | POA: Insufficient documentation

## 2024-02-06 DIAGNOSIS — R809 Proteinuria, unspecified: Secondary | ICD-10-CM | POA: Insufficient documentation

## 2024-02-06 DIAGNOSIS — M109 Gout, unspecified: Secondary | ICD-10-CM | POA: Insufficient documentation

## 2024-02-06 DIAGNOSIS — D631 Anemia in chronic kidney disease: Secondary | ICD-10-CM | POA: Diagnosis present

## 2024-02-06 LAB — RENAL FUNCTION PANEL
Albumin: 3.8 g/dL (ref 3.5–5.0)
Anion gap: 14 (ref 5–15)
BUN: 31 mg/dL — ABNORMAL HIGH (ref 6–20)
CO2: 29 mmol/L (ref 22–32)
Calcium: 9.3 mg/dL (ref 8.9–10.3)
Chloride: 93 mmol/L — ABNORMAL LOW (ref 98–111)
Creatinine, Ser: 1.76 mg/dL — ABNORMAL HIGH (ref 0.44–1.00)
GFR, Estimated: 36 mL/min — ABNORMAL LOW (ref >60)
Glucose, Bld: 122 mg/dL — ABNORMAL HIGH (ref 70–99)
Phosphorus: 3.4 mg/dL (ref 2.5–4.6)
Potassium: 3 mmol/L — ABNORMAL LOW (ref 3.5–5.1)
Sodium: 136 mmol/L (ref 135–145)

## 2024-02-06 LAB — PROTEIN / CREATININE RATIO, URINE
Creatinine, Urine: 55 mg/dL
Protein Creatinine Ratio: 0.11 mg/mg{creat} (ref 0.00–0.15)
Total Protein, Urine: 6 mg/dL

## 2024-02-06 LAB — CBC
HCT: 38.5 % (ref 36.0–46.0)
Hemoglobin: 13.2 g/dL (ref 12.0–15.0)
MCH: 30.3 pg (ref 26.0–34.0)
MCHC: 34.3 g/dL (ref 30.0–36.0)
MCV: 88.5 fL (ref 80.0–100.0)
Platelets: 353 10*3/uL (ref 150–400)
RBC: 4.35 MIL/uL (ref 3.87–5.11)
RDW: 13.9 % (ref 11.5–15.5)
WBC: 8.5 10*3/uL (ref 4.0–10.5)
nRBC: 0 % (ref 0.0–0.2)

## 2024-02-06 LAB — IRON AND TIBC
Iron: 87 ug/dL (ref 28–170)
Saturation Ratios: 15 % (ref 10.4–31.8)
TIBC: 566 ug/dL — ABNORMAL HIGH (ref 250–450)
UIBC: 479 ug/dL

## 2024-02-06 LAB — URIC ACID: Uric Acid, Serum: 7.4 mg/dL — ABNORMAL HIGH (ref 2.5–7.1)

## 2024-02-06 LAB — FERRITIN: Ferritin: 40 ng/mL (ref 11–307)

## 2024-02-26 ENCOUNTER — Ambulatory Visit: Payer: Medicaid Other | Admitting: Physician Assistant

## 2024-03-04 ENCOUNTER — Telehealth: Payer: Self-pay | Admitting: Primary Care

## 2024-03-04 ENCOUNTER — Ambulatory Visit: Payer: Medicaid Other | Admitting: Primary Care

## 2024-03-04 NOTE — Telephone Encounter (Signed)
 Spoke with patient regarding  her 4:00 pm 03/04/24 appointment with Eulas Hick, NP--provider is out sick and patient is rescheduled to 6.12.25 at 11:30 am--will mail information to patient and she voiced her understanding

## 2024-04-23 ENCOUNTER — Ambulatory Visit: Admitting: Podiatry

## 2024-04-25 ENCOUNTER — Ambulatory Visit: Admitting: Physician Assistant

## 2024-04-25 NOTE — Progress Notes (Deleted)
 04/25/2024 Natasha Cain [id number] 1975-12-04  Referring provider: Veda Gerald, MD Primary GI doctor: {[id number]}  ASSESSMENT AND PLAN:  Anemia with history of CKD stage 4/proteinuria 11/12/2023 TIBC elevated at 613, UIBC 556 and elevated, iron 57 iron saturation 9, B12 254, folic acid  6.6, ferritin 22 Started on iron supplement 02/06/2024  HGB 13.2 MCV 88.5 Platelets 353 02/06/2024 Iron 87 Ferritin 40 Recent Labs    01/06/24 1446 02/06/24 1131  HGB 15.1* 13.2  Patient has degree of CKD that is likely contributing to anemia.    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 female with a past medical history listed below presents for evaluation of ***.   *** Discussed the use of AI scribe software for clinical note transcription with the patient, who gave verbal consent to proceed.  History of Present Illness            She  reports that she has been smoking cigarettes. She does not have any smokeless tobacco history on file. She reports that she does not drink alcohol and does not use drugs.  RELEVANT GI HISTORY, IMAGING AND LABS: Results          CBC    Component  Value Date/Time   WBC 8.5 02/06/2024 1131   RBC 4.35 02/06/2024 1131   HGB 13.2 02/06/2024 1131   HCT 38.5 02/06/2024 1131   PLT 353 02/06/2024 1131   MCV 88.5 02/06/2024 1131   MCH 30.3 02/06/2024 1131   MCHC 34.3 02/06/2024 1131   RDW 13.9 02/06/2024 1131   LYMPHSABS 4.6 (H) 03/30/2022 1432   MONOABS 0.6 03/30/2022 1432   EOSABS 0.1 03/30/2022 1432   BASOSABS 0.1 03/30/2022 1432   Recent Labs    01/06/24 1446 02/06/24 1131  HGB 15.1* 13.2    CMP     Component Value Date/Time   NA 136 02/06/2024 1131   K 3.0 (L) 02/06/2024 1131   CL 93 (L) 02/06/2024 1131   CO2 29 02/06/2024 1131   GLUCOSE 122 (H) 02/06/2024 1131   BUN 31 (H) 02/06/2024 1131   CREATININE 1.76 (H) 02/06/2024 1131   CALCIUM 9.3 02/06/2024 1131   PROT 8.1 01/06/2024 1446   ALBUMIN 3.8 02/06/2024 1131   AST 32 01/06/2024 1446   ALT 23 01/06/2024 1446   ALKPHOS 61 01/06/2024 1446   BILITOT 0.8 01/06/2024 1446   GFRNONAA 36 (L) 02/06/2024 1131   GFRAA >90 02/08/2014 1858      Latest Ref Rng & Units 02/06/2024   11:31 AM 01/06/2024  2:46 PM  Hepatic Function  Total Protein 6.5 - 8.1 g/dL  8.1   Albumin 3.5 - 5.0 g/dL 3.8  4.1   AST 15 - 41 U/L  32   ALT 0 - 44 U/L  23   Alk Phosphatase 38 - 126 U/L  61   Total Bilirubin 0.0 - 1.2 mg/dL  0.8       Current Medications:   Current Outpatient Medications (Endocrine & Metabolic):    levothyroxine (SYNTHROID) 125 MCG tablet, Take 125 mcg by mouth daily before breakfast.   metFORMIN (GLUCOPHAGE) 1000 MG tablet, Take 1,000 mg by mouth 2 (two) times daily with a meal.  Current Outpatient Medications (Cardiovascular):    atenolol (TENORMIN) 50 MG tablet, Take 50 mg by mouth daily.   chlorthalidone (HYGROTON) 25 MG tablet, Take 25 mg by mouth daily.   cloNIDine (CATAPRES) 0.3 MG tablet, Take 0.3 mg by mouth at bedtime.   furosemide (LASIX) 20 MG tablet, Take 20 mg by mouth daily as needed for edema.  Current Outpatient Medications (Respiratory):     albuterol  (VENTOLIN  HFA) 108 (90 Base) MCG/ACT inhaler, Inhale 2 puffs into the lungs every 6 (six) hours as needed for wheezing or shortness of breath.   Fluticasone-Umeclidin-Vilant (TRELEGY ELLIPTA) 100-62.5-25 MCG/ACT AEPB, Inhale 1 puff into the lungs daily.  Current Outpatient Medications (Analgesics):    acetaminophen  (TYLENOL ) 500 MG tablet, Take 2 tablets (1,000 mg total) by mouth every 6 (six) hours as needed for mild pain or moderate pain.   aspirin  EC 81 MG tablet, Take 1 tablet (81 mg total) by mouth 2 (two) times daily. To prevent blood clots for 30 days after surgery.   HYDROcodone -acetaminophen  (NORCO) 10-325 MG tablet, Take 1 tablet by mouth every 6 (six) hours as needed for severe pain (after surgery that is not resolved by your normal daily dose of Methadone ).   METHADONE  HCL PO, Take 120 mg by mouth daily.   Current Outpatient Medications (Other):    amphetamine-dextroamphetamine (ADDERALL) 30 MG tablet, Take 20 mg by mouth daily in the afternoon.   ARIPiprazole (ABILIFY) 5 MG tablet, Take 5 mg by mouth daily.   cephALEXin  (KEFLEX ) 500 MG capsule, Take 1 capsule (500 mg total) by mouth 4 (four) times daily.   gabapentin (NEURONTIN) 400 MG capsule, Take 400 mg by mouth in the morning, at noon, in the evening, and at bedtime.   hydrOXYzine (VISTARIL) 100 MG capsule, Take 100 mg by mouth at bedtime.   lidocaine  (LIDODERM ) 5 %, Place 1 patch onto the skin daily. Remove & Discard patch within 12 hours or as directed by MD   Multiple Vitamins-Minerals (MULTIVITAMIN WITH MINERALS) tablet, Take 1 tablet by mouth daily.   nystatin (MYCOSTATIN/NYSTOP) powder, Apply 1 application. topically daily.   Omega-3 Fatty Acids (FISH OIL) 1000 MG CAPS, Take 1,000 mg by mouth daily.   omeprazole (PRILOSEC) 40 MG capsule, Take 80 mg by mouth every morning.   ondansetron  (ZOFRAN ) 4 MG tablet, Take 1 tablet (4 mg total) by mouth every 8 (eight) hours as needed for nausea or vomiting.   ondansetron   (ZOFRAN -ODT) 4 MG disintegrating tablet, Take 1 tablet (4 mg total) by mouth 2 (two) times daily as needed for nausea or vomiting.   Potassium 99 MG TABS, Take 198 mg by mouth daily.   potassium chloride  SA (KLOR-CON  M) 20 MEQ tablet, Take 1 tablet (20 mEq total) by mouth 3 (three) times daily.   potassium chloride  SA (KLOR-CON  M) 20 MEQ tablet, Take  1 tablet (20 mEq total) by mouth 2 (two) times daily.   rOPINIRole (REQUIP) 1 MG tablet, Take 1 mg by mouth at bedtime.   sertraline (ZOLOFT) 100 MG tablet, Take 200 mg by mouth at bedtime.   tiZANidine (ZANAFLEX) 4 MG tablet, Take 4 mg by mouth every 8 (eight) hours as needed for muscle spasms.   topiramate (TOPAMAX) 100 MG tablet, Take 100 mg by mouth at bedtime.   VYVANSE 70 MG capsule, Take 70 mg by mouth every morning.   zinc gluconate 50 MG tablet, Take 50 mg by mouth at bedtime.  Medical History:  Past Medical History:  Diagnosis Date   Anxiety    Arthritis    Asthma    Complication of anesthesia    patietn wakes up in panic mode   COPD (chronic obstructive pulmonary disease) (HCC)    Depression    Diabetes (HCC)    Hyperlipidemia    Hypertension    Hypothyroidism    PONV (postoperative nausea and vomiting)    Substance abuse (HCC)    Thyroid  disease    Allergies:  Allergies  Allergen Reactions   Cymbalta [Duloxetine Hcl] Anaphylaxis   Latex Rash   Zorvolex [Diclofenac] Swelling    Stroke like symptoms including tingling in arms and tongue in addition to swelling   Ambien  [Zolpidem  Tartrate] Other (See Comments)    Hallucinations   Codeine Rash   Darvon [Propoxyphene] Rash    Darvocet   Mobic  [Meloxicam ] Other (See Comments)    Numbness in arms   Sulfa Antibiotics Hives and Rash     Surgical History:  She  has a past surgical history that includes Spine surgery (05/15/2012); Cesarean section (05/22/2007); Cholecystectomy (11/21/2007); Knee surgery (07/22/2011); Back surgery; Incisional hernia repair (N/A,  04/03/2022); ORIF toe fracture (Left, 11/14/2022); ORIF toe fracture (Right, 12/29/2022); and Foot surgery (Bilateral). Family History:  Her family history includes Cancer in her cousin, maternal aunt, maternal grandfather, mother, paternal grandfather, and paternal uncle.  REVIEW OF SYSTEMS  : All other systems reviewed and negative except where noted in the History of Present Illness.  PHYSICAL EXAM: LMP  (LMP Unknown)  Physical Exam          Edmonia Gottron, PA-C 11:50 AM

## 2024-04-30 ENCOUNTER — Ambulatory Visit: Admitting: Podiatry

## 2024-05-01 ENCOUNTER — Encounter: Payer: Self-pay | Admitting: Nurse Practitioner

## 2024-05-01 ENCOUNTER — Ambulatory Visit: Admitting: Nurse Practitioner

## 2024-05-01 VITALS — BP 123/59 | HR 53 | Ht 65.0 in | Wt 242.0 lb

## 2024-05-01 DIAGNOSIS — G4700 Insomnia, unspecified: Secondary | ICD-10-CM | POA: Diagnosis not present

## 2024-05-01 DIAGNOSIS — R0683 Snoring: Secondary | ICD-10-CM | POA: Diagnosis not present

## 2024-05-01 DIAGNOSIS — Z6841 Body Mass Index (BMI) 40.0 and over, adult: Secondary | ICD-10-CM

## 2024-05-01 DIAGNOSIS — G4719 Other hypersomnia: Secondary | ICD-10-CM

## 2024-05-01 DIAGNOSIS — F17210 Nicotine dependence, cigarettes, uncomplicated: Secondary | ICD-10-CM

## 2024-05-01 DIAGNOSIS — R0681 Apnea, not elsewhere classified: Secondary | ICD-10-CM

## 2024-05-01 NOTE — Assessment & Plan Note (Signed)
 She has snoring, excessive daytime sleepiness, nocturnal apneic events, morning headaches, restless sleep, drowsy driving. BMI 40. Epworth 11. Given this,  I am concerned she could have sleep disordered breathing with obstructive sleep apnea. She will need sleep study for further evaluation. Advised to not drive due to risk of accidents.    - discussed how weight can impact sleep and risk for sleep disordered breathing - discussed options to assist with weight loss: combination of diet modification, cardiovascular and strength training exercises   - had an extensive discussion regarding the adverse health consequences related to untreated sleep disordered breathing - specifically discussed the risks for hypertension, coronary artery disease, cardiac dysrhythmias, cerebrovascular disease, and diabetes - lifestyle modification discussed   - discussed how sleep disruption can increase risk of accidents, particularly when driving - safe driving practices were discussed  Patient Instructions  Given your symptoms, I am concerned that you may have sleep disordered breathing with sleep apnea. You will need a sleep study for further evaluation. Someone will contact you to schedule this.   We discussed how untreated sleep apnea puts an individual at risk for cardiac arrhthymias, pulm HTN, DM, stroke and increases their risk for daytime accidents. We also briefly reviewed treatment options including weight loss, side sleeping position, oral appliance, CPAP therapy or referral to ENT for possible surgical options  Use caution when driving and pull over if you become sleepy.  Follow up in 6-8 weeks with Katie Charli Halle,NP to go over sleep study results, or sooner, if needed. Friday PM virtual clinic preferred

## 2024-05-01 NOTE — Progress Notes (Signed)
 @Patient  ID: Natasha Cain, female    DOB: [birth year], 49 y.o.   MRN: [id number]  Chief Complaint  Patient presents with   Sleep Consult    Referring provider: Jane Meager, MD  HPI: 48 year old female, active smoker referred for sleep consult. Past medical history significant for HTN, mild asthma, hypothyroidism, insulin dependent diabetic, hx of substance abuse, HLD, depression, anxiety, obesity.  TEST/EVENTS:   05/01/2024: Today - sleep consult Discussed the use of AI scribe software for clinical note transcription with the patient, who gave verbal consent to proceed.  History of Present Illness   Natasha Cain is a 48 year old female with suspected sleep apnea who presents for evaluation of sleep disturbances.   She experiences loud snoring and episodes of apnea during sleep, as noted by her daughter. She sometimes wakes herself up due to snoring and has witnessed herself stop breathing for approximately thirty seconds in a video.  She describes significant insomnia, with prior episodes lasting three to five days. Various sleep medications have been tried, including doxepin, which was ineffective, and others that caused hallucinations. This has been managed by her psychiatrist.   She experiences excessive daytime sleepiness, leading to drowsy driving and an incident where she fell asleep in her parked car for three hours following a trip to the store. She reports frequent urination at night and chronic pain that disrupts her sleep. She also has experienced sleep paralysis and has a history of sleepwalking, though not recently and believes this was medication induced.   She is currently on Adderall, which does not alleviate her daytime sleepiness, and takes Abilify and clonidine, which may contribute to drowsiness. She denies alcohol and caffeine intake.  She has a history of diabetes, managed with insulin. Her A1c was last recorded at 5.1. She has experienced significant  weight loss, from 303 to 242 pounds over the past year.    She is on disability. Never had a prior sleep study. No supplemental oxygen use.  Goes to bed between 11 pm and midnight. Falls asleep quickly but wakes numerous times. Gets up between 5-6 am. Lives with her daughter.   Epworth 11      Allergies  Allergen Reactions   Cymbalta [Duloxetine Hcl] Anaphylaxis   Latex Rash   Zorvolex [Diclofenac] Swelling    Stroke like symptoms including tingling in arms and tongue in addition to swelling   Ambien  [Zolpidem  Tartrate] Other (See Comments)    Hallucinations   Codeine Rash   Darvon [Propoxyphene] Rash    Darvocet   Mobic  [Meloxicam ] Other (See Comments)    Numbness in arms   Sulfa Antibiotics Hives and Rash    Immunization History  Administered Date(s) Administered   Moderna SARS-COV2 Booster Vaccination 09/23/2020   Moderna Sars-Covid-2 Vaccination 02/03/2020, 03/02/2020   Tdap 05/23/2007    Past Medical History:  Diagnosis Date   Anxiety    Arthritis    Asthma    Complication of anesthesia    patietn wakes up in panic mode   COPD (chronic obstructive pulmonary disease) (HCC)    Depression    Diabetes (HCC)    Hyperlipidemia    Hypertension    Hypothyroidism    PONV (postoperative nausea and vomiting)    Substance abuse (HCC)    Thyroid  disease     Tobacco History: Social History   Tobacco Use  Smoking Status Every Day   Current packs/day: 0.50   Types: Cigarettes  Smokeless Tobacco Not on file  Ready to quit: Not Answered Counseling given: Not Answered   Outpatient Medications Prior to Visit  Medication Sig Dispense Refill   acetaminophen  (TYLENOL ) 500 MG tablet Take 2 tablets (1,000 mg total) by mouth every 6 (six) hours as needed for mild pain or moderate pain. 80 tablet 0   albuterol  (VENTOLIN  HFA) 108 (90 Base) MCG/ACT inhaler Inhale 2 puffs into the lungs every 6 (six) hours as needed for wheezing or shortness of breath.     allopurinol  (ZYLOPRIM) 100 MG tablet Take 100 mg by mouth daily.     amphetamine-dextroamphetamine (ADDERALL) 30 MG tablet Take 20 mg by mouth daily in the afternoon.     ARIPiprazole (ABILIFY) 5 MG tablet Take 5 mg by mouth daily.     aspirin  EC 81 MG tablet Take 1 tablet (81 mg total) by mouth 2 (two) times daily. To prevent blood clots for 30 days after surgery. 60 tablet 0   atenolol (TENORMIN) 50 MG tablet Take 50 mg by mouth daily.     buPROPion (WELLBUTRIN SR) 150 MG 12 hr tablet Take 150 mg by mouth daily.     cephALEXin  (KEFLEX ) 500 MG capsule Take 1 capsule (500 mg total) by mouth 4 (four) times daily. 20 capsule 0   chlorthalidone (HYGROTON) 25 MG tablet Take 25 mg by mouth daily.     cloNIDine (CATAPRES) 0.3 MG tablet Take 0.3 mg by mouth at bedtime.     Fluticasone-Umeclidin-Vilant (TRELEGY ELLIPTA) 100-62.5-25 MCG/ACT AEPB Inhale 1 puff into the lungs daily.     furosemide (LASIX) 20 MG tablet Take 20 mg by mouth daily as needed for edema.     gabapentin (NEURONTIN) 400 MG capsule Take 400 mg by mouth in the morning, at noon, in the evening, and at bedtime.     HYDROcodone -acetaminophen  (NORCO) 10-325 MG tablet Take 1 tablet by mouth every 6 (six) hours as needed for severe pain (after surgery that is not resolved by your normal daily dose of Methadone ). 28 tablet 0   hydrOXYzine (VISTARIL) 100 MG capsule Take 100 mg by mouth at bedtime.     levothyroxine (SYNTHROID) 125 MCG tablet Take 125 mcg by mouth daily before breakfast.     lidocaine  (LIDODERM ) 5 % Place 1 patch onto the skin daily. Remove & Discard patch within 12 hours or as directed by MD 30 patch 0   metFORMIN (GLUCOPHAGE) 1000 MG tablet Take 1,000 mg by mouth 2 (two) times daily with a meal.     METHADONE  HCL PO Take 120 mg by mouth daily.     Multiple Vitamins-Minerals (MULTIVITAMIN WITH MINERALS) tablet Take 1 tablet by mouth daily.     nystatin (MYCOSTATIN/NYSTOP) powder Apply 1 application. topically daily.     Omega-3 Fatty  Acids (FISH OIL) 1000 MG CAPS Take 1,000 mg by mouth daily.     omeprazole (PRILOSEC) 40 MG capsule Take 80 mg by mouth every morning.     ondansetron  (ZOFRAN ) 4 MG tablet Take 1 tablet (4 mg total) by mouth every 8 (eight) hours as needed for nausea or vomiting.     ondansetron  (ZOFRAN -ODT) 4 MG disintegrating tablet Take 1 tablet (4 mg total) by mouth 2 (two) times daily as needed for nausea or vomiting. 10 tablet 0   Potassium 99 MG TABS Take 198 mg by mouth daily.     potassium chloride  SA (KLOR-CON  M) 20 MEQ tablet Take 1 tablet (20 mEq total) by mouth 3 (three) times daily. 15 tablet 0  potassium chloride  SA (KLOR-CON  M) 20 MEQ tablet Take 1 tablet (20 mEq total) by mouth 2 (two) times daily. 30 tablet 0   predniSONE  (DELTASONE ) 10 MG tablet Take 40 mg by mouth daily.     rOPINIRole (REQUIP) 1 MG tablet Take 1 mg by mouth at bedtime.     sertraline (ZOLOFT) 100 MG tablet Take 200 mg by mouth at bedtime.     spironolactone (ALDACTONE) 25 MG tablet Take 25 mg by mouth daily.     tiZANidine (ZANAFLEX) 4 MG tablet Take 4 mg by mouth every 8 (eight) hours as needed for muscle spasms.     topiramate (TOPAMAX) 100 MG tablet Take 100 mg by mouth at bedtime.     traMADol (ULTRAM) 50 MG tablet Take 50 mg by mouth 3 (three) times daily as needed.     VYVANSE 70 MG capsule Take 70 mg by mouth every morning.     zinc gluconate 50 MG tablet Take 50 mg by mouth at bedtime.     No facility-administered medications prior to visit.     Review of Systems:   Constitutional: No night sweats, fevers, chills, or lassitude. +intentional weight loss, fatigue  HEENT: No difficulty swallowing, tooth/dental problems, or sore throat. No sneezing, itching, ear ache, or post nasal drip +headaches, chronic nasal congestion,  CV:  No chest pain, orthopnea, PND, swelling in lower extremities, anasarca, dizziness, palpitations, syncope Resp: +snoring, witnessed apneas, baseline shortness of breath, chronic cough No  excess mucus or change in color of mucus. No hemoptysis. No wheezing.  No chest wall deformity GI:  +occasional heartburn, indigestion. No abdominal pain, nausea, vomiting, diarrhea, change in bowel habits, loss of appetite, bloody stools.  GU:+nocturia  Skin: No rash, lesions, ulcerations MSK: +chronic joint pains  Neuro: No memory impairment  Psych: +stable depression, anxiety. Mood stable. +sleep disturbance    Physical Exam:  BP (!) 123/59 (BP Location: Left Arm)   Pulse (!) 53   Ht 5' 5 (1.651 m)   Wt 242 lb (109.8 kg)   LMP  (LMP Unknown)   SpO2 95%   BMI 40.27 kg/m   GEN: Pleasant, interactive, well-kempt; obese; in no acute distress HEENT:  Normocephalic and atraumatic. PERRLA. Sclera white. Nasal turbinates pink, moist and patent bilaterally. No rhinorrhea present. Oropharynx pink and moist, without exudate or edema. No lesions, ulcerations, or postnasal drip. Mallampati III/IV NECK:  Supple w/ fair ROM. Thyroid  symmetrical with no goiter or nodules palpated. No lymphadenopathy.   CV: RRR, no m/r/g, no peripheral edema. Pulses intact, +2 bilaterally. No cyanosis, pallor or clubbing. PULMONARY:  Unlabored, regular breathing. Clear bilaterally A&P w/o wheezes/rales/rhonchi. No accessory muscle use.  GI: BS present and normoactive. Soft, non-tender to palpation.  MSK: No erythema, warmth or tenderness. Cap refil <2 sec all extrem.  Neuro: A/Ox3. No focal deficits noted.   Skin: Warm, no lesions or rashe Psych: Normal affect and behavior. Judgement and thought content appropriate.     Lab Results:  CBC    Component Value Date/Time   WBC 8.5 02/06/2024 1131   RBC 4.35 02/06/2024 1131   HGB 13.2 02/06/2024 1131   HCT 38.5 02/06/2024 1131   PLT 353 02/06/2024 1131   MCV 88.5 02/06/2024 1131   MCH 30.3 02/06/2024 1131   MCHC 34.3 02/06/2024 1131   RDW 13.9 02/06/2024 1131   LYMPHSABS 4.6 (H) 03/30/2022 1432   MONOABS 0.6 03/30/2022 1432   EOSABS 0.1 03/30/2022 1432    BASOSABS 0.1 03/30/2022 1432  BMET    Component Value Date/Time   NA 136 02/06/2024 1131   K 3.0 (L) 02/06/2024 1131   CL 93 (L) 02/06/2024 1131   CO2 29 02/06/2024 1131   GLUCOSE 122 (H) 02/06/2024 1131   BUN 31 (H) 02/06/2024 1131   CREATININE 1.76 (H) 02/06/2024 1131   CALCIUM 9.3 02/06/2024 1131   GFRNONAA 36 (L) 02/06/2024 1131   GFRAA >90 02/08/2014 1858    BNP No results found for: BNP   Imaging:  No results found.  Administration History     None           No data to display          No results found for: NITRICOXIDE      Assessment & Plan:   Excessive daytime sleepiness She has snoring, excessive daytime sleepiness, nocturnal apneic events, morning headaches, restless sleep, drowsy driving. BMI 40. Epworth 11. Given this,  I am concerned she could have sleep disordered breathing with obstructive sleep apnea. She will need sleep study for further evaluation. Advised to not drive due to risk of accidents.    - discussed how weight can impact sleep and risk for sleep disordered breathing - discussed options to assist with weight loss: combination of diet modification, cardiovascular and strength training exercises   - had an extensive discussion regarding the adverse health consequences related to untreated sleep disordered breathing - specifically discussed the risks for hypertension, coronary artery disease, cardiac dysrhythmias, cerebrovascular disease, and diabetes - lifestyle modification discussed   - discussed how sleep disruption can increase risk of accidents, particularly when driving - safe driving practices were discussed  Patient Instructions  Given your symptoms, I am concerned that you may have sleep disordered breathing with sleep apnea. You will need a sleep study for further evaluation. Someone will contact you to schedule this.   We discussed how untreated sleep apnea puts an individual at risk for cardiac arrhthymias,  pulm HTN, DM, stroke and increases their risk for daytime accidents. We also briefly reviewed treatment options including weight loss, side sleeping position, oral appliance, CPAP therapy or referral to ENT for possible surgical options  Use caution when driving and pull over if you become sleepy.  Follow up in 6-8 weeks with Katie Dasean Brow,NP to go over sleep study results, or sooner, if needed. Friday PM virtual clinic preferred       Loud snoring See above  Severe obesity (BMI >= 40) (HCC) BMI 40. Continued healthy weight loss encouraged. May be a candidate for GLP-1 with Wegovy or Zepbound in setting of obesity, DM, and possible OSA. Advised to follow up with PCP to discuss.   Insomnia Possibly due to untreated OSA. See above plan. She is managed by psychiatry. Mood stable. Would defer to them for medication changes   Advised if symptoms do not improve or worsen, to please contact office for sooner follow up or seek emergency care.   I spent 45 minutes of dedicated to the care of this patient on the date of this encounter to include pre-visit review of records, face-to-face time with the patient discussing conditions above, post visit ordering of testing, clinical documentation with the electronic health record, making appropriate referrals as documented, and communicating necessary findings to members of the patients care team.  Roetta Clarke, NP 05/01/2024  Pt aware and understands NP's role.

## 2024-05-01 NOTE — Assessment & Plan Note (Signed)
 BMI 40. Continued healthy weight loss encouraged. May be a candidate for GLP-1 with Wegovy or Zepbound in setting of obesity, DM, and possible OSA. Advised to follow up with PCP to discuss.

## 2024-05-01 NOTE — Patient Instructions (Signed)
 Given your symptoms, I am concerned that you may have sleep disordered breathing with sleep apnea. You will need a sleep study for further evaluation. Someone will contact you to schedule this.   We discussed how untreated sleep apnea puts an individual at risk for cardiac arrhthymias, pulm HTN, DM, stroke and increases their risk for daytime accidents. We also briefly reviewed treatment options including weight loss, side sleeping position, oral appliance, CPAP therapy or referral to ENT for possible surgical options  Use caution when driving and pull over if you become sleepy.  Follow up in 6-8 weeks with Natasha Caterra Ostroff,NP to go over sleep study results, or sooner, if needed. Friday PM virtual clinic preferred

## 2024-05-01 NOTE — Assessment & Plan Note (Signed)
 Possibly due to untreated OSA. See above plan. She is managed by psychiatry. Mood stable. Would defer to them for medication changes

## 2024-05-01 NOTE — Assessment & Plan Note (Signed)
 See above

## 2024-05-12 ENCOUNTER — Other Ambulatory Visit: Payer: Self-pay

## 2024-05-12 ENCOUNTER — Emergency Department (HOSPITAL_COMMUNITY)
Admission: EM | Admit: 2024-05-12 | Discharge: 2024-05-12 | Discharge status: Against Medical Advice | Attending: Emergency Medicine | Admitting: Emergency Medicine

## 2024-05-12 ENCOUNTER — Encounter (HOSPITAL_COMMUNITY): Payer: Self-pay

## 2024-05-12 DIAGNOSIS — R1110 Vomiting, unspecified: Secondary | ICD-10-CM | POA: Diagnosis not present

## 2024-05-12 DIAGNOSIS — Z760 Encounter for issue of repeat prescription: Secondary | ICD-10-CM | POA: Insufficient documentation

## 2024-05-12 DIAGNOSIS — Z5321 Procedure and treatment not carried out due to patient leaving prior to being seen by health care provider: Secondary | ICD-10-CM | POA: Insufficient documentation

## 2024-05-12 NOTE — ED Notes (Signed)
 Patient stated she did not want to wait any longer. Pulled OTF.

## 2024-05-12 NOTE — ED Provider Triage Note (Signed)
 Emergency Medicine Provider Triage Evaluation Note  Brietta Manso , a 48 y.o. female  was evaluated in triage.  Pt complains of need for methadone  refill.  States that she takes methadone  for chronic pain, has been vomiting the last 3 doses and requests single dose of methadone  today.  Has not had a dose of methadone  since Saturday due to vomiting her dose..  Review of Systems  Positive: Medication request, methadone  Negative:   Physical Exam  BP (!) 122/56 (BP Location: Left Arm)   Pulse 70   Temp 98.2 F (36.8 C) (Oral)   Resp 15   Ht 5' 5 (1.651 m)   Wt 109.8 kg   LMP  (LMP Unknown)   SpO2 99%   BMI 40.27 kg/m  Gen:   Awake, no distress   Resp:  Normal effort  MSK:   Moves extremities without difficulty  Other:    Medical Decision Making  Medically screening exam initiated at 12:39 PM.  Appropriate orders placed.  Ellese Julius was informed that the remainder of the evaluation will be completed by another provider, this initial triage assessment does not replace that evaluation, and the importance of remaining in the ED until their evaluation is complete.  Attempted to call [facility] to verify dosing, she states that her current dose is 165 mg of methadone .  Will reattempt to call, long wait times at the current time.   Myriam Dorn [place], [place] 05/12/24 1240

## 2024-05-12 NOTE — ED Triage Notes (Signed)
 Pt takes methadone . Pt says she vomited her medication d/t it going down the wrong pipe. Pt says she hasn't had her dose since Saturday.    165 mg Methadone 

## 2024-05-15 ENCOUNTER — Ambulatory Visit: Admitting: Podiatry

## 2024-05-17 ENCOUNTER — Encounter (HOSPITAL_COMMUNITY): Payer: Self-pay

## 2024-05-17 ENCOUNTER — Emergency Department (HOSPITAL_COMMUNITY)
Admission: EM | Admit: 2024-05-17 | Discharge: 2024-05-17 | Disposition: A | Discharge status: Home / Self Care | Attending: Emergency Medicine | Admitting: Emergency Medicine

## 2024-05-17 ENCOUNTER — Other Ambulatory Visit: Payer: Self-pay

## 2024-05-17 DIAGNOSIS — R112 Nausea with vomiting, unspecified: Secondary | ICD-10-CM | POA: Insufficient documentation

## 2024-05-17 DIAGNOSIS — Z91040 Latex allergy status: Secondary | ICD-10-CM | POA: Diagnosis not present

## 2024-05-17 DIAGNOSIS — T403X5A Adverse effect of methadone, initial encounter: Secondary | ICD-10-CM | POA: Insufficient documentation

## 2024-05-17 DIAGNOSIS — T50905A Adverse effect of unspecified drugs, medicaments and biological substances, initial encounter: Secondary | ICD-10-CM

## 2024-05-17 DIAGNOSIS — Z7982 Long term (current) use of aspirin: Secondary | ICD-10-CM | POA: Diagnosis not present

## 2024-05-17 MED ORDER — HYDROCODONE-ACETAMINOPHEN 10-325 MG PO TABS: 1.0000 | ORAL_TABLET | Freq: Four times a day (QID) | ORAL | 0 refills | Status: DC | PRN

## 2024-05-17 MED ORDER — ONDANSETRON 4 MG PO TBDP: 4.0000 mg | ORAL_TABLET | Freq: Three times a day (TID) | ORAL | 0 refills | Status: DC | PRN

## 2024-05-17 NOTE — ED Triage Notes (Signed)
 Pt reports:  Takes methadone  Vomiting last 2 days States it it due to the taste of the medication Due to go to pain clinic on Tuesday  Unsure if she can wait that long

## 2024-05-17 NOTE — Discharge Instructions (Signed)
 Please follow-up closely with your pain management doctor on an outpatient basis.  Return to emergency department immediately for any new or worsening symptoms.

## 2024-05-17 NOTE — ED Provider Notes (Signed)
 [facility] EMERGENCY DEPARTMENT AT [facility] Provider Note   CSN: [id number] Arrival date & time: 05/17/24  1355     Patient presents with: No chief complaint on file.   Natasha Cain is a 48 y.o. female.   Patient is a 48 year old female who presents emergency department with a chief complaint of vomiting over the past 2 days.  Patient notes that she is chronically on methadone  and notes that she has not been able to tolerate her p.o. methadone  and has vomited that up.  She notes that she is currently out of her methadone  at this point is not due to see her pain management doctor for the next 3 days.  Patient notes that she is starting to feel more anxious at this point.  She denies any associate abdominal pain at this time.        Prior to Admission medications   Medication Sig Start Date End Date Taking? Authorizing Provider  HYDROcodone -acetaminophen  (NORCO) 10-325 MG tablet Take 1 tablet by mouth every 6 (six) hours as needed for severe pain (pain score 7-10). 05/17/24  Yes Tierria Watson D, PA-C  ondansetron  (ZOFRAN -ODT) 4 MG disintegrating tablet Take 1 tablet (4 mg total) by mouth every 8 (eight) hours as needed for nausea or vomiting. 05/17/24  Yes Sreekar Broyhill D, PA-C  acetaminophen  (TYLENOL ) 500 MG tablet Take 2 tablets (1,000 mg total) by mouth every 6 (six) hours as needed for mild pain or moderate pain. 11/14/22   Gawne, Meghan M, PA-C  albuterol  (VENTOLIN  HFA) 108 (90 Base) MCG/ACT inhaler Inhale 2 puffs into the lungs every 6 (six) hours as needed for wheezing or shortness of breath.    [provider]  allopurinol (ZYLOPRIM) 100 MG tablet Take 100 mg by mouth daily. 04/24/24   [provider]  amphetamine-dextroamphetamine (ADDERALL) 30 MG tablet Take 20 mg by mouth daily in the afternoon. 10/25/21   [provider]  ARIPiprazole (ABILIFY) 5 MG tablet Take 5 mg by mouth daily.    [provider]  aspirin  EC 81 MG  tablet Take 1 tablet (81 mg total) by mouth 2 (two) times daily. To prevent blood clots for 30 days after surgery. 12/29/22   Gawne, Meghan M, PA-C  atenolol (TENORMIN) 50 MG tablet Take 50 mg by mouth daily. 03/20/22   [provider]  buPROPion (WELLBUTRIN SR) 150 MG 12 hr tablet Take 150 mg by mouth daily.    [provider]  cephALEXin  (KEFLEX ) 500 MG capsule Take 1 capsule (500 mg total) by mouth 4 (four) times daily. 01/06/24   Marlo Asberry SAILOR, MD  chlorthalidone (HYGROTON) 25 MG tablet Take 25 mg by mouth daily.    [provider]  cloNIDine (CATAPRES) 0.3 MG tablet Take 0.3 mg by mouth at bedtime.    [provider]  Fluticasone-Umeclidin-Vilant (TRELEGY ELLIPTA) 100-62.5-25 MCG/ACT AEPB Inhale 1 puff into the lungs daily.    [provider]  furosemide (LASIX) 20 MG tablet Take 20 mg by mouth daily as needed for edema.    [provider]  gabapentin (NEURONTIN) 400 MG capsule Take 400 mg by mouth in the morning, at noon, in the evening, and at bedtime. 11/30/20   [provider]  hydrOXYzine (VISTARIL) 100 MG capsule Take 100 mg by mouth at bedtime. 10/25/21   [provider]  levothyroxine (SYNTHROID) 125 MCG tablet Take 125 mcg by mouth daily before breakfast. 10/21/20   [provider]  lidocaine  (LIDODERM ) 5 %  Place 1 patch onto the skin daily. Remove & Discard patch within 12 hours or as directed by MD 07/08/22   Theotis Cameron HERO, PA-C  metFORMIN (GLUCOPHAGE) 1000 MG tablet Take 1,000 mg by mouth 2 (two) times daily with a meal.    [provider]  METHADONE  HCL PO Take 120 mg by mouth daily.    [provider]  Multiple Vitamins-Minerals (MULTIVITAMIN WITH MINERALS) tablet Take 1 tablet by mouth daily.    [provider]  nystatin (MYCOSTATIN/NYSTOP) powder Apply 1 application. topically daily. 12/20/21   [provider]  Omega-3 Fatty Acids (FISH OIL) 1000 MG CAPS Take 1,000 mg  by mouth daily.    [provider]  omeprazole (PRILOSEC) 40 MG capsule Take 80 mg by mouth every morning.    [provider]  ondansetron  (ZOFRAN ) 4 MG tablet Take 1 tablet (4 mg total) by mouth every 8 (eight) hours as needed for nausea or vomiting. 01/06/24   Marlo Asberry SAILOR, MD  Potassium 99 MG TABS Take 198 mg by mouth daily.    [provider]  potassium chloride  SA (KLOR-CON  M) 20 MEQ tablet Take 1 tablet (20 mEq total) by mouth 3 (three) times daily. 03/30/22   Mavis Anes, MD  potassium chloride  SA (KLOR-CON  M) 20 MEQ tablet Take 1 tablet (20 mEq total) by mouth 2 (two) times daily. 01/06/24   Marlo Asberry SAILOR, MD  predniSONE  (DELTASONE ) 10 MG tablet Take 40 mg by mouth daily. 04/15/24   [provider]  rOPINIRole (REQUIP) 1 MG tablet Take 1 mg by mouth at bedtime.    [provider]  sertraline (ZOLOFT) 100 MG tablet Take 200 mg by mouth at bedtime. 05/31/16   [provider]  spironolactone (ALDACTONE) 25 MG tablet Take 25 mg by mouth daily. 04/18/24   [provider]  tiZANidine (ZANAFLEX) 4 MG tablet Take 4 mg by mouth every 8 (eight) hours as needed for muscle spasms.    [provider]  topiramate (TOPAMAX) 100 MG tablet Take 100 mg by mouth at bedtime. 10/29/20   [provider]  traMADol (ULTRAM) 50 MG tablet Take 50 mg by mouth 3 (three) times daily as needed.    [provider]  VYVANSE 70 MG capsule Take 70 mg by mouth every morning. 10/25/21   [provider]  zinc gluconate 50 MG tablet Take 50 mg by mouth at bedtime.    [provider]    Allergies: Cymbalta [duloxetine hcl], Latex, Zorvolex [diclofenac], Ambien  [zolpidem  tartrate], Codeine, Darvon [propoxyphene], Mobic  [meloxicam ], and Sulfa antibiotics    Review of Systems  Gastrointestinal:  Positive for nausea and vomiting.  All other systems reviewed and are negative.   Updated Vital Signs BP 130/80 (BP  Location: Right Arm)   Pulse 68   Temp 98.1 F (36.7 C) (Oral)   Resp 15   Ht 5' 5 (1.651 m)   Wt 105.2 kg   LMP  (LMP Unknown)   SpO2 99%   BMI 38.61 kg/m   Physical Exam Vitals and nursing note reviewed.  Constitutional:      Appearance: Normal appearance.  HENT:     Head: Normocephalic and atraumatic.     Nose: Nose normal.     Mouth/Throat:     Mouth: Mucous membranes are moist.   Eyes:     Extraocular Movements: Extraocular movements intact.     Conjunctiva/sclera: Conjunctivae normal.     Pupils: Pupils are equal, round, and  reactive to light.    Cardiovascular:     Rate and Rhythm: Normal rate and regular rhythm.     Pulses: Normal pulses.     Heart sounds: Normal heart sounds.  Pulmonary:     Effort: Pulmonary effort is normal. No respiratory distress.  Abdominal:     General: Abdomen is flat. Bowel sounds are normal.     Palpations: Abdomen is soft.   Musculoskeletal:        General: Normal range of motion.     Cervical back: Normal range of motion and neck supple.   Skin:    General: Skin is warm and dry.   Neurological:     General: No focal deficit present.     Mental Status: She is alert and oriented to person, place, and time. Mental status is at baseline.   Psychiatric:        Mood and Affect: Mood normal.        Behavior: Behavior normal.        Thought Content: Thought content normal.        Judgment: Judgment normal.     (all labs ordered are listed, but only abnormal results are displayed) Labs Reviewed - No data to display  EKG: None  Radiology: No results found.   Procedures   Medications Ordered in the ED - No data to display                                  Medical Decision Making Patient is doing well at this time and is stable for discharge home.  Initially discussed with patient we will provide her with a different medication other than her methadone  to get her through until her appointment with her pain management  doctor.  Upon further investigation she was prescribed 42 hydrocodone  10 tablets on 05/08/2024 and should still have this medication at home.  Prescription that was initially called and has been canceled at this point but Zofran  does remain in place to help with her nausea and vomiting.  She will need to follow-up with her pain management doctor.  Patient had no obvious indication for opiate withdrawal on initial presentation.  Risk Prescription drug management.        Final diagnoses:  Nausea and vomiting, unspecified vomiting type  Adverse effect of drug, initial encounter    ED Discharge Orders          Ordered    HYDROcodone -acetaminophen  (NORCO) 10-325 MG tablet  Every 6 hours PRN        05/17/24 1443    ondansetron  (ZOFRAN -ODT) 4 MG disintegrating tablet  Every 8 hours PRN        05/17/24 1443               Daralene Lonni BIRCH, PA-C 05/17/24 1522    Suzette Pac, MD 05/19/24 1108

## 2024-05-21 ENCOUNTER — Encounter

## 2024-05-21 ENCOUNTER — Telehealth: Payer: Self-pay | Admitting: Nurse Practitioner

## 2024-05-21 ENCOUNTER — Ambulatory Visit: Admitting: Podiatry

## 2024-05-21 NOTE — Telephone Encounter (Signed)
 Hasn't had her sleep study so cannot provide surgical risk assessment. She will have to discuss with surgeon and anesthesia. Thanks.

## 2024-05-21 NOTE — Telephone Encounter (Signed)
 Fax received from Dr. Evalene Chancy with [facility] to perform a right total knee arthroplasty under choice anesthesia on patient.  Patient needs surgery clearance. Surgery is pending. Patient was seen on 05/01/24. Office protocol is a risk assessment can be sent to surgeon if patient has been seen in 60 days or less.   Sending to [facility] for risk assessment or recommendations if patient needs to be seen in office prior to surgical procedure.   Note that pt is scheduled to pick up sleep study box in office 05/21/24

## 2024-05-30 ENCOUNTER — Telehealth: Payer: Self-pay | Admitting: Nurse Practitioner

## 2024-05-30 ENCOUNTER — Encounter

## 2024-05-30 NOTE — Telephone Encounter (Signed)
 Hello this patient has cancelled for her HST three times. I asked if she was able to make it at all today and she is not [place]. Please advise

## 2024-05-30 NOTE — Telephone Encounter (Signed)
 Pt is scheduled for HST box pick up 05/30/24  Pt has pending appt with Katie for 06/10/24 and will discuss clearance then based on results of the HST  Will hold in clearance pool for now

## 2024-06-04 NOTE — Telephone Encounter (Signed)
 Will need to notify patient of her options and inquire as to what she wants to do.

## 2024-06-04 NOTE — Telephone Encounter (Signed)
 I spoke with Natasha Cain at [facility] due to her insurance they would not cover it but if she did want to do a SNAP sleep test it would cost her $275. If not an in lab would be the only other option.

## 2024-06-04 NOTE — Telephone Encounter (Signed)
 Is she able to do SNAP? Needs sleep study before OV so her next appt will need to be canceled. Thanks.

## 2024-06-05 NOTE — Telephone Encounter (Signed)
 I have called pt and lVM left her my direct line to call back. Will inform when I hear from her

## 2024-06-06 ENCOUNTER — Encounter

## 2024-06-10 ENCOUNTER — Ambulatory Visit: Admitting: Nurse Practitioner

## 2024-06-13 NOTE — Telephone Encounter (Signed)
 Pt was a no show 06/10/24

## 2024-06-26 NOTE — Telephone Encounter (Signed)
 Pt has not rescheduled and has not called back regarding options for sleep study. I called and left her detailed msg to call the office back and will close encounter per protocol.

## 2024-07-22 ENCOUNTER — Other Ambulatory Visit (HOSPITAL_COMMUNITY)
Admission: RE | Admit: 2024-07-22 | Discharge: 2024-07-22 | Disposition: A | Discharge status: Home / Self Care | Source: Ambulatory Visit | Attending: Nephrology | Admitting: Nephrology

## 2024-07-22 DIAGNOSIS — I10 Essential (primary) hypertension: Secondary | ICD-10-CM | POA: Diagnosis present

## 2024-07-22 DIAGNOSIS — R809 Proteinuria, unspecified: Secondary | ICD-10-CM | POA: Insufficient documentation

## 2024-07-22 DIAGNOSIS — D631 Anemia in chronic kidney disease: Secondary | ICD-10-CM | POA: Insufficient documentation

## 2024-07-22 DIAGNOSIS — N189 Chronic kidney disease, unspecified: Secondary | ICD-10-CM | POA: Insufficient documentation

## 2024-07-22 DIAGNOSIS — I129 Hypertensive chronic kidney disease with stage 1 through stage 4 chronic kidney disease, or unspecified chronic kidney disease: Secondary | ICD-10-CM | POA: Insufficient documentation

## 2024-07-22 DIAGNOSIS — E119 Type 2 diabetes mellitus without complications: Secondary | ICD-10-CM | POA: Diagnosis present

## 2024-07-22 DIAGNOSIS — E1122 Type 2 diabetes mellitus with diabetic chronic kidney disease: Secondary | ICD-10-CM | POA: Insufficient documentation

## 2024-07-22 LAB — CBC
HCT: 42.1 % (ref 36.0–46.0)
Hemoglobin: 14.1 g/dL (ref 12.0–15.0)
MCH: 30.9 pg (ref 26.0–34.0)
MCHC: 33.5 g/dL (ref 30.0–36.0)
MCV: 92.1 fL (ref 80.0–100.0)
Platelets: 405 K/uL — ABNORMAL HIGH (ref 150–400)
RBC: 4.57 MIL/uL (ref 3.87–5.11)
RDW: 13.3 % (ref 11.5–15.5)
WBC: 8.4 K/uL (ref 4.0–10.5)
nRBC: 0 % (ref 0.0–0.2)

## 2024-07-22 LAB — RENAL FUNCTION PANEL
Albumin: 4.1 g/dL (ref 3.5–5.0)
Anion gap: 13 (ref 5–15)
BUN: 26 mg/dL — ABNORMAL HIGH (ref 6–20)
CO2: 30 mmol/L (ref 22–32)
Calcium: 9.4 mg/dL (ref 8.9–10.3)
Chloride: 96 mmol/L — ABNORMAL LOW (ref 98–111)
Creatinine, Ser: 1.8 mg/dL — ABNORMAL HIGH (ref 0.44–1.00)
GFR, Estimated: 35 mL/min — ABNORMAL LOW (ref >60)
Glucose, Bld: 83 mg/dL (ref 70–99)
Phosphorus: 3.6 mg/dL (ref 2.5–4.6)
Potassium: 3.6 mmol/L (ref 3.5–5.1)
Sodium: 139 mmol/L (ref 135–145)

## 2024-07-23 LAB — MICROALBUMIN / CREATININE URINE RATIO
Creatinine, Urine: 181.2 mg/dL
Microalb Creat Ratio: 17 mg/g{creat} (ref 0–29)
Microalb, Ur: 31.1 ug/mL — ABNORMAL HIGH

## 2024-07-30 ENCOUNTER — Other Ambulatory Visit: Payer: Self-pay | Admitting: Orthopedic Surgery

## 2024-08-07 ENCOUNTER — Other Ambulatory Visit: Payer: Self-pay

## 2024-08-07 DIAGNOSIS — I872 Venous insufficiency (chronic) (peripheral): Secondary | ICD-10-CM

## 2024-08-07 NOTE — Progress Notes (Signed)
 Surgical Instructions   Your procedure is scheduled on Wednesday August 13, 2024. Report to [facility] Main Entrance A at 5:30 A.M., then check in with the Admitting office. Any questions or running late day of surgery: call [phone]  Questions prior to your surgery date: call [phone], Monday-Friday, 8am-4pm. If you experience any cold or flu symptoms such as cough, fever, chills, shortness of breath, etc. between now and your scheduled surgery, please notify us  at the above number.     Remember:  Do not eat after midnight the night before your surgery  You may drink clear liquids until 4:30 the morning of your surgery.   Clear liquids allowed are: Water, Non-Citrus Juices (without pulp), Carbonated Beverages, Clear Tea (no milk, honey, etc.), Black Coffee Only (NO MILK, CREAM OR POWDERED CREAMER of any kind), and Gatorade.   Patient Instructions  The night before surgery:  No food after midnight. ONLY clear liquids after midnight    The day of surgery (if you have diabetes): Drink ONE (1) 12 oz G2 given to you in your pre admission testing appointment by 4:30 the morning of surgery. Drink in one sitting. Do not sip.  This drink was given to you during your hospital  pre-op appointment visit.  Nothing else to drink after completing the  12 oz bottle of G2.         If you have questions, please contact your surgeon's office.  Take these medicines the morning of surgery with A SIP OF WATER  allopurinol (ZYLOPRIM)  ARIPiprazole (ABILIFY)  atenolol (TENORMIN)  buPROPion (WELLBUTRIN SR)  busPIRone (BUSPAR)  fluticasone-salmeterol (ADVAIR)  gabapentin (NEURONTIN)  METHADONE   levothyroxine (SYNTHROID)  omeprazole (PRILOSEC)  sertraline (ZOLOFT)    May take these medicines IF NEEDED: acetaminophen  (TYLENOL )  albuterol  inhaler MAY BRING WITH YOU oxyCODONE -acetaminophen  (PERCOCET)  tiZANidine (ZANAFLEX)   One week prior to surgery, STOP taking any Aspirin   (unless otherwise instructed by your surgeon) Aleve, Naproxen, Ibuprofen, Motrin, Advil, Goody's, BC's, all herbal medications, fish oil, and non-prescription vitamins.     THISINCLUDES YOUR Omega-3 Fatty Acids (FISH OIL).            WHAT DO I DO ABOUT MY DIABETES MEDICATION?   Do not take oral diabetes medicines (pills) the morning of surgery.      THE MORNING OF SURGERY, take 1/2 of normal dose if blood sugar greater than 220.take   The day of surgery, do not take other diabetes injectables, including Byetta (exenatide), Bydureon (exenatide ER), Victoza (liraglutide), or Trulicity (dulaglutide).  If your CBG is greater than 220 mg/dL, you may take  of your sliding scale (correction) dose of insulin.   HOW TO MANAGE YOUR DIABETES BEFORE AND AFTER SURGERY  Why is it important to control my blood sugar before and after surgery? Improving blood sugar levels before and after surgery helps healing and can limit problems. A way of improving blood sugar control is eating a healthy diet by:  Eating less sugar and carbohydrates  Increasing activity/exercise  Talking with your doctor about reaching your blood sugar goals High blood sugars (greater than 180 mg/dL) can raise your risk of infections and slow your recovery, so you will need to focus on controlling your diabetes during the weeks before surgery. Make sure that the doctor who takes care of your diabetes knows about your planned surgery including the date and location.  How do I manage my blood sugar before surgery? Check your blood sugar at least 4 times a day,  starting 2 days before surgery, to make sure that the level is not too high or low.  Check your blood sugar the morning of your surgery when you wake up and every 2 hours until you get to the Short Stay unit.  If your blood sugar is less than 70 mg/dL, you will need to treat for low blood sugar: Do not take insulin. Treat a low blood sugar (less than 70 mg/dL) with  cup of  clear juice (cranberry or apple), 4 glucose tablets, OR glucose gel. Recheck blood sugar in 15 minutes after treatment (to make sure it is greater than 70 mg/dL). If your blood sugar is not greater than 70 mg/dL on recheck, call [phone] for further instructions. Report your blood sugar to the short stay nurse when you get to Short Stay.  If you are admitted to the hospital after surgery: Your blood sugar will be checked by the staff and you will probably be given insulin after surgery (instead of oral diabetes medicines) to make sure you have good blood sugar levels. The goal for blood sugar control after surgery is 80-180 mg/dL.       Do NOT Smoke (Tobacco/Vaping) for 24 hours prior to your procedure.  If you use a CPAP at night, you may bring your mask/headgear for your overnight stay.   You will be asked to remove any contacts, glasses, piercing's, hearing aid's, dentures/partials prior to surgery. Please bring cases for these items if needed.    Patients discharged the day of surgery will not be allowed to drive home, and someone needs to stay with them for 24 hours.  SURGICAL WAITING ROOM VISITATION Patients may have no more than 2 support people in the waiting area - these visitors may rotate.   Pre-op nurse will coordinate an appropriate time for 1 ADULT support person, who may not rotate, to accompany patient in pre-op.  Children under the age of 74 must have an adult with them who is not the patient and must remain in the main waiting area with an adult.  If the patient needs to stay at the hospital during part of their recovery, the visitor guidelines for inpatient rooms apply.  Please refer to the [facility] website for the visitor guidelines for any additional information.   If you received a COVID test during your pre-op visit  it is requested that you wear a mask when out in public, stay away from anyone that may not be feeling well and notify your surgeon if you develop  symptoms. If you have been in contact with anyone that has tested positive in the last 10 days please notify you surgeon.      Pre-operative 5 CHG Bathing Instructions   You can play a key role in reducing the risk of infection after surgery. Your skin needs to be as free of germs as possible. You can reduce the number of germs on your skin by washing with CHG (chlorhexidine  gluconate) soap before surgery. CHG is an antiseptic soap that kills germs and continues to kill germs even after washing.   DO NOT use if you have an allergy to chlorhexidine /CHG or antibacterial soaps. If your skin becomes reddened or irritated, stop using the CHG and notify one of our RNs at [phone].   Please shower with the CHG soap starting 4 days before surgery using the following schedule:     Please keep in mind the following:  DO NOT shave, including legs and underarms, starting the  day of your first shower.   You may shave your face at any point before/day of surgery.  Place clean sheets on your bed the day you start using CHG soap. Use a clean washcloth (not used since being washed) for each shower. DO NOT sleep with pets once you start using the CHG.   CHG Shower Instructions:  Wash your face and private area with normal soap. If you choose to wash your hair, wash first with your normal shampoo.  After you use shampoo/soap, rinse your hair and body thoroughly to remove shampoo/soap residue.  Turn the water OFF and apply about 3 tablespoons (45 ml) of CHG soap to a CLEAN washcloth.  Apply CHG soap ONLY FROM YOUR NECK DOWN TO YOUR TOES (washing for 3-5 minutes)  DO NOT use CHG soap on face, private areas, open wounds, or sores.  Pay special attention to the area where your surgery is being performed.  If you are having back surgery, having someone wash your back for you may be helpful. Wait 2 minutes after CHG soap is applied, then you may rinse off the CHG soap.  Pat dry with a clean towel  Put on  clean clothes/pajamas   If you choose to wear lotion, please use ONLY the CHG-compatible lotions that are listed below.  Additional instructions for the day of surgery: DO NOT APPLY any lotions, deodorants, cologne, or perfumes.   Do not bring valuables to the hospital. [facility] is not responsible for any belongings/valuables. Do not wear nail polish, gel polish, artificial nails, or any other type of covering on natural nails (fingers and toes) Do not wear jewelry or makeup Put on clean/comfortable clothes.  Please brush your teeth.  Ask your nurse before applying any prescription medications to the skin.     CHG Compatible Lotions   Aveeno Moisturizing lotion  Cetaphil Moisturizing Cream  Cetaphil Moisturizing Lotion  Clairol Herbal Essence Moisturizing Lotion, Dry Skin  Clairol Herbal Essence Moisturizing Lotion, Extra Dry Skin  Clairol Herbal Essence Moisturizing Lotion, Normal Skin  Curel Age Defying Therapeutic Moisturizing Lotion with Alpha Hydroxy  Curel Extreme Care Body Lotion  Curel Soothing Hands Moisturizing Hand Lotion  Curel Therapeutic Moisturizing Cream, Fragrance-Free  Curel Therapeutic Moisturizing Lotion, Fragrance-Free  Curel Therapeutic Moisturizing Lotion, Original Formula  Eucerin Daily Replenishing Lotion  Eucerin Dry Skin Therapy Plus Alpha Hydroxy Crme  Eucerin Dry Skin Therapy Plus Alpha Hydroxy Lotion  Eucerin Original Crme  Eucerin Original Lotion  Eucerin Plus Crme Eucerin Plus Lotion  Eucerin TriLipid Replenishing Lotion  Keri Anti-Bacterial Hand Lotion  Keri Deep Conditioning Original Lotion Dry Skin Formula Softly Scented  Keri Deep Conditioning Original Lotion, Fragrance Free Sensitive Skin Formula  Keri Lotion Fast Absorbing Fragrance Free Sensitive Skin Formula  Keri Lotion Fast Absorbing Softly Scented Dry Skin Formula  Keri Original Lotion  Keri Skin Renewal Lotion Keri Silky Smooth Lotion  Keri Silky Smooth Sensitive Skin  Lotion  Nivea Body Creamy Conditioning Oil  Nivea Body Extra Enriched Lotion  Nivea Body Original Lotion  Nivea Body Sheer Moisturizing Lotion Nivea Crme  Nivea Skin Firming Lotion  NutraDerm 30 Skin Lotion  NutraDerm Skin Lotion  NutraDerm Therapeutic Skin Cream  NutraDerm Therapeutic Skin Lotion  ProShield Protective Hand Cream  Provon moisturizing lotion  Please read over the following fact sheets that you were given.

## 2024-08-08 ENCOUNTER — Other Ambulatory Visit: Payer: Self-pay

## 2024-08-08 ENCOUNTER — Encounter (HOSPITAL_COMMUNITY)
Admission: RE | Admit: 2024-08-08 | Discharge: 2024-08-08 | Disposition: A | Discharge status: Home / Self Care | Source: Ambulatory Visit | Attending: Orthopedic Surgery | Admitting: Orthopedic Surgery

## 2024-08-08 ENCOUNTER — Encounter (HOSPITAL_COMMUNITY): Payer: Self-pay

## 2024-08-08 VITALS — BP 113/70 | HR 63 | Temp 98.2°F | Resp 18 | Ht 66.0 in | Wt 223.0 lb

## 2024-08-08 DIAGNOSIS — F1910 Other psychoactive substance abuse, uncomplicated: Secondary | ICD-10-CM | POA: Insufficient documentation

## 2024-08-08 DIAGNOSIS — E119 Type 2 diabetes mellitus without complications: Secondary | ICD-10-CM | POA: Diagnosis not present

## 2024-08-08 DIAGNOSIS — Z01812 Encounter for preprocedural laboratory examination: Secondary | ICD-10-CM | POA: Insufficient documentation

## 2024-08-08 DIAGNOSIS — Z01818 Encounter for other preprocedural examination: Secondary | ICD-10-CM

## 2024-08-08 DIAGNOSIS — I10 Essential (primary) hypertension: Secondary | ICD-10-CM | POA: Insufficient documentation

## 2024-08-08 LAB — COMPREHENSIVE METABOLIC PANEL WITH GFR
ALT: 29 U/L (ref 0–44)
AST: 30 U/L (ref 15–41)
Albumin: 3.9 g/dL (ref 3.5–5.0)
Alkaline Phosphatase: 70 U/L (ref 38–126)
Anion gap: 10 (ref 5–15)
BUN: 28 mg/dL — ABNORMAL HIGH (ref 6–20)
CO2: 24 mmol/L (ref 22–32)
Calcium: 10.2 mg/dL (ref 8.9–10.3)
Chloride: 100 mmol/L (ref 98–111)
Creatinine, Ser: 1.73 mg/dL — ABNORMAL HIGH (ref 0.44–1.00)
GFR, Estimated: 36 mL/min — ABNORMAL LOW (ref >60)
Glucose, Bld: 128 mg/dL — ABNORMAL HIGH (ref 70–99)
Potassium: 3.7 mmol/L (ref 3.5–5.1)
Sodium: 134 mmol/L — ABNORMAL LOW (ref 135–145)
Total Bilirubin: 0.5 mg/dL (ref 0.0–1.2)
Total Protein: 7.5 g/dL (ref 6.5–8.1)

## 2024-08-08 LAB — TYPE AND SCREEN
ABO/RH(D): O NEG
Antibody Screen: NEGATIVE

## 2024-08-08 LAB — CBC
HCT: 42.8 % (ref 36.0–46.0)
Hemoglobin: 14.6 g/dL (ref 12.0–15.0)
MCH: 31 pg (ref 26.0–34.0)
MCHC: 34.1 g/dL (ref 30.0–36.0)
MCV: 90.9 fL (ref 80.0–100.0)
Platelets: 438 K/uL — ABNORMAL HIGH (ref 150–400)
RBC: 4.71 MIL/uL (ref 3.87–5.11)
RDW: 12.6 % (ref 11.5–15.5)
WBC: 11.1 K/uL — ABNORMAL HIGH (ref 4.0–10.5)
nRBC: 0 % (ref 0.0–0.2)

## 2024-08-08 LAB — SURGICAL PCR SCREEN
MRSA, PCR: NEGATIVE
Staphylococcus aureus: NEGATIVE

## 2024-08-08 LAB — HEMOGLOBIN A1C
Hgb A1c MFr Bld: 5.2 % (ref 4.8–5.6)
Mean Plasma Glucose: 102.54 mg/dL

## 2024-08-08 LAB — GLUCOSE, CAPILLARY: Glucose-Capillary: 125 mg/dL — ABNORMAL HIGH (ref 70–99)

## 2024-08-08 NOTE — Progress Notes (Addendum)
 PCP - Dr. Darroll Cardiologist - denies  Chest x-ray -  EKG - 01/06/24 Stress Test - denies ECHO - denies Cardiac Cath - denies  Sleep Study - STOP BANG assessment routed to PCP. Pt reports she was dismissed from pulmonology clinic for missing 2 appts. Sleep study ordered but no completed. CPAP -   Fasting Blood Sugar - 80-100 Checks Blood Sugar twice a day  Last dose of GLP1 agonist-  n/a GLP1 instructions:   Blood Thinner Instructions: Aspirin  Instructions: LD 08/01/24  ERAS Protcol - clears until 4:30 PRE-SURGERY Ensure or G2- G2 provided   COVID TEST- n/a   Anesthesia review: yes, pulmonology clearance?  Patient denies shortness of breath, fever, cough and chest pain at PAT appointment   Addendum: Did call surgeon's office and spoke with Arland regarding Day 1 surgery vs. Posting. Current consent is CORRECT, and posting is not an anterior approach. Day 1 surgery is a lateral approach. Arland states there was no other posting to choose from.

## 2024-08-08 NOTE — Progress Notes (Signed)
   08/08/24 1347  OBSTRUCTIVE SLEEP APNEA  Have you ever been diagnosed with sleep apnea through a sleep study? No  Do you snore loudly (loud enough to be heard through closed doors)?  1  Do you often feel tired, fatigued, or sleepy during the daytime (such as falling asleep during driving or talking to someone)? 1  Has anyone observed you stop breathing during your sleep? 1  Do you have, or are you being treated for high blood pressure? 1  BMI more than 35 kg/m2? 1  Age > 50 (1-yes) 0  Neck circumference greater than:Female 16 inches or larger, Female 17inches or larger? 1 (85)  Female Gender (Yes=1) 0  Obstructive Sleep Apnea Score 6  Score 5 or greater  Results sent to PCP (pt is awaiting sleep study--ordered for her)

## 2024-08-11 ENCOUNTER — Encounter

## 2024-08-11 ENCOUNTER — Encounter (HOSPITAL_COMMUNITY)

## 2024-08-11 NOTE — Progress Notes (Addendum)
 Anesthesia Chart Review:  48 year old female current smoker with pertinent history including PONV, HTN, GERD on PPI, asthma/COPD, CKD 3, chronic pain, hypothyroidism, IDDM2, prior substance abuse, HLD, depression, anxiety, obesity BMI 36.  She was recently referred to pulmonology for sleep consult due to suspected sleep apnea.  Unfortunately, she never completed workup for this.  Presumed OSA with STOP-BANG score of 6, Epworth 11, and report of excessive daytime sleepiness, nocturnal apneic events, restless sleep.  Per anesthesia records, GlideScope used electively for intubation 04/03/2022 due to, neck discomfort, short thick neck, small mouth opening.  Preop labs reviewed, creatinine elevated 1.73 consistent with history of CKD 3, otherwise unremarkable.  DM2 well-controlled with A1c 5.2.  EKG 01/06/2024: Sinus rhythm.  Rate 65. Nonspecific intraventricular conduction delay. Borderline repolarization abnormality  TTE 02/06/2020 (Care Everywhere): Summary   1. Technically difficult study.    2. The left ventricle is normal in size with normal wall thickness.    3. The left ventricular systolic function is normal, LVEF is visually  estimated at 60-65%.    4. The right ventricle is normal in size, with normal systolic function.       Lynwood Geofm RIGGERS [facility]/Anesthesiology Phone [phone] 08/11/2024 10:08 AM

## 2024-08-11 NOTE — Anesthesia Preprocedure Evaluation (Addendum)
 Anesthesia Evaluation  Patient identified by MRN, date of birth, ID band Patient awake    Reviewed: Allergy & Precautions, NPO status , Patient's Chart, lab work & pertinent test results  History of Anesthesia Complications (+) PONV and history of anesthetic complications (wakes up in panic mode)  Airway Mallampati: III  TM Distance: >3 FB Neck ROM: Full  Mouth opening: Limited Mouth Opening Comment: Previous grade I view with Glidescope 3 Dental  (+) Dental Advisory Given, Missing   Pulmonary neg shortness of breath, asthma , neg sleep apnea, COPD,  COPD inhaler, neg recent URI, Current SmokerPatient did not abstain from smoking.   Pulmonary exam normal breath sounds clear to auscultation       Cardiovascular hypertension (atenolol ), Pt. on home beta blockers (-) angina (-) Past MI, (-) Cardiac Stents and (-) CABG (-) dysrhythmias  Rhythm:Regular Rate:Normal  HLD   Neuro/Psych  PSYCHIATRIC DISORDERS Anxiety Depression    negative neurological ROS     GI/Hepatic ,GERD  Medicated,,(+)     substance abuse    Endo/Other  diabetes (Hgb A1c 5.2), Well Controlled, Type 2, Insulin  DependentHypothyroidism    Renal/GU negative Renal ROS     Musculoskeletal  (+) Arthritis ,    Abdominal  (+) + obese  Peds  Hematology negative hematology ROS (+) Lab Results      Component                Value               Date                      WBC                      11.1 (H)            08/08/2024                HGB                      14.6                08/08/2024                HCT                      42.8                08/08/2024                MCV                      90.9                08/08/2024                PLT                      438 (H)             08/08/2024              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  Reproductive/Obstetrics                              Anesthesia  Physical Anesthesia Plan  ASA: 3  Anesthesia Plan: General  Post-op Pain Management: Tylenol  PO (pre-op)* and Precedex    Induction: Intravenous  PONV Risk Score and Plan: 3 and Ondansetron , Dexamethasone , Treatment may vary due to age or medical condition and Midazolam   Airway Management Planned: Oral ETT and Video Laryngoscope Planned  Additional Equipment:   Intra-op Plan:   Post-operative Plan: Extubation in OR  Informed Consent: I have reviewed the patients History and Physical, chart, labs and discussed the procedure including the risks, benefits and alternatives for the proposed anesthesia with the patient or authorized representative who has indicated his/her understanding and acceptance.     Dental advisory given  Plan Discussed with: CRNA and Anesthesiologist  Anesthesia Plan Comments: (Nebulizer treatment given in preop. Risks of general anesthesia discussed including, but not limited to, sore throat, hoarse voice, chipped/damaged teeth, injury to vocal cords, nausea and vomiting, allergic reactions, lung infection, heart attack, stroke, and death. All questions answered.   PAT note by Lynwood Hope, PA-C: 48 year old female current smoker with pertinent history including PONV, HTN, GERD on PPI, asthma/COPD, CKD 3, chronic pain, hypothyroidism, IDDM2, prior substance abuse, HLD, depression, anxiety, obesity BMI 36.  She was recently referred to pulmonology for sleep consult due to suspected sleep apnea.  Unfortunately, she never completed workup for this.  Presumed OSA with STOP-BANG score of 6, Epworth 11, and report of excessive daytime sleepiness, nocturnal apneic events, restless sleep.  Per anesthesia records, GlideScope used electively for intubation 04/03/2022 due to, neck discomfort, short thick neck, small mouth opening.  Preop labs reviewed, creatinine elevated 1.73 consistent with history of CKD 3, otherwise unremarkable.  DM2 well-controlled with A1c  5.2.  EKG 01/06/2024: Sinus rhythm.  Rate 65. Nonspecific intraventricular conduction delay. Borderline repolarization abnormality  TTE 02/06/2020 (Care Everywhere): Summary  1. Technically difficult study.  2. The left ventricle is normal in size with normal wall thickness.  3. The left ventricular systolic function is normal, LVEF is visually  estimated at 60-65%.  4. The right ventricle is normal in size, with normal systolic function.   )         Anesthesia Quick Evaluation

## 2024-08-13 ENCOUNTER — Encounter (HOSPITAL_COMMUNITY)
Admission: RE | Disposition: A | Discharge status: Home / Self Care | Payer: Self-pay | Source: Home / Self Care | Attending: Orthopedic Surgery

## 2024-08-13 ENCOUNTER — Inpatient Hospital Stay (HOSPITAL_COMMUNITY): Payer: Self-pay | Admitting: Physician Assistant

## 2024-08-13 ENCOUNTER — Inpatient Hospital Stay (HOSPITAL_COMMUNITY)
Admission: RE | Admit: 2024-08-13 | Discharge: 2024-08-15 | DRG: 402 | Disposition: A | Discharge status: Home / Self Care | Attending: Orthopedic Surgery | Admitting: Orthopedic Surgery

## 2024-08-13 ENCOUNTER — Other Ambulatory Visit: Payer: Self-pay

## 2024-08-13 ENCOUNTER — Inpatient Hospital Stay (HOSPITAL_COMMUNITY): Payer: Self-pay | Admitting: Anesthesiology

## 2024-08-13 ENCOUNTER — Encounter (HOSPITAL_COMMUNITY): Payer: Self-pay | Admitting: Orthopedic Surgery

## 2024-08-13 ENCOUNTER — Inpatient Hospital Stay (HOSPITAL_COMMUNITY)

## 2024-08-13 DIAGNOSIS — M48061 Spinal stenosis, lumbar region without neurogenic claudication: Secondary | ICD-10-CM | POA: Diagnosis present

## 2024-08-13 DIAGNOSIS — I10 Essential (primary) hypertension: Secondary | ICD-10-CM

## 2024-08-13 DIAGNOSIS — Z79890 Hormone replacement therapy (postmenopausal): Secondary | ICD-10-CM

## 2024-08-13 DIAGNOSIS — F17210 Nicotine dependence, cigarettes, uncomplicated: Secondary | ICD-10-CM | POA: Diagnosis present

## 2024-08-13 DIAGNOSIS — Z7951 Long term (current) use of inhaled steroids: Secondary | ICD-10-CM | POA: Diagnosis not present

## 2024-08-13 DIAGNOSIS — M5410 Radiculopathy, site unspecified: Principal | ICD-10-CM | POA: Diagnosis present

## 2024-08-13 DIAGNOSIS — E039 Hypothyroidism, unspecified: Secondary | ICD-10-CM | POA: Diagnosis present

## 2024-08-13 DIAGNOSIS — E119 Type 2 diabetes mellitus without complications: Secondary | ICD-10-CM

## 2024-08-13 DIAGNOSIS — E785 Hyperlipidemia, unspecified: Secondary | ICD-10-CM | POA: Diagnosis present

## 2024-08-13 DIAGNOSIS — Z981 Arthrodesis status: Secondary | ICD-10-CM

## 2024-08-13 DIAGNOSIS — F418 Other specified anxiety disorders: Secondary | ICD-10-CM | POA: Diagnosis not present

## 2024-08-13 DIAGNOSIS — Z01818 Encounter for other preprocedural examination: Secondary | ICD-10-CM

## 2024-08-13 DIAGNOSIS — M5116 Intervertebral disc disorders with radiculopathy, lumbar region: Principal | ICD-10-CM | POA: Diagnosis present

## 2024-08-13 HISTORY — PX: ANTERIOR LAT LUMBAR FUSION: SHX1168

## 2024-08-13 LAB — GLUCOSE, CAPILLARY
Glucose-Capillary: 172 mg/dL — ABNORMAL HIGH (ref 70–99)
Glucose-Capillary: 128 mg/dL — ABNORMAL HIGH (ref 70–99)
Glucose-Capillary: 134 mg/dL — ABNORMAL HIGH (ref 70–99)
Glucose-Capillary: 125 mg/dL — ABNORMAL HIGH (ref 70–99)
Glucose-Capillary: 130 mg/dL — ABNORMAL HIGH (ref 70–99)

## 2024-08-13 LAB — ABO/RH: ABO/RH(D): O NEG

## 2024-08-13 SURGERY — ANTERIOR LATERAL LUMBAR FUSION 1 LEVEL
Anesthesia: General | Laterality: Left

## 2024-08-13 MED ORDER — LIDOCAINE 2% (20 MG/ML) 5 ML SYRINGE
INTRAMUSCULAR | Status: AC
  Filled 2024-08-13: qty 5

## 2024-08-13 MED ORDER — ATENOLOL 50 MG PO TABS
100.0000 mg | ORAL_TABLET | Freq: Every morning | ORAL | Status: DC
  Administered 2024-08-15: 100 mg via ORAL
  Filled 2024-08-13: qty 2

## 2024-08-13 MED ORDER — SERTRALINE HCL 100 MG PO TABS
200.0000 mg | ORAL_TABLET | Freq: Every morning | ORAL | Status: DC
  Administered 2024-08-15: 200 mg via ORAL
  Filled 2024-08-13: qty 2

## 2024-08-13 MED ORDER — BUPIVACAINE-EPINEPHRINE (PF) 0.25% -1:200000 IJ SOLN
INTRAMUSCULAR | Status: AC
  Filled 2024-08-13: qty 30

## 2024-08-13 MED ORDER — PANTOPRAZOLE SODIUM 40 MG PO TBEC
80.0000 mg | DELAYED_RELEASE_TABLET | Freq: Every day | ORAL | Status: DC
  Administered 2024-08-14: 80 mg via ORAL
  Filled 2024-08-13 (×2): qty 2

## 2024-08-13 MED ORDER — LIDOCAINE-EPINEPHRINE (PF) 1 %-1:200000 IJ SOLN
INTRAMUSCULAR | Status: DC | PRN
  Administered 2024-08-13: 30 mL via INTRAMUSCULAR

## 2024-08-13 MED ORDER — METHOCARBAMOL 500 MG PO TABS
ORAL_TABLET | ORAL | Status: AC
  Filled 2024-08-13: qty 1

## 2024-08-13 MED ORDER — ONDANSETRON HCL 4 MG PO TABS
4.0000 mg | ORAL_TABLET | Freq: Four times a day (QID) | ORAL | Status: DC | PRN
  Administered 2024-08-13: 4 mg via ORAL
  Filled 2024-08-13: qty 1

## 2024-08-13 MED ORDER — TOPIRAMATE 25 MG PO TABS
100.0000 mg | ORAL_TABLET | Freq: Every day | ORAL | Status: DC
  Administered 2024-08-13 – 2024-08-14 (×2): 100 mg via ORAL
  Filled 2024-08-13 (×2): qty 4

## 2024-08-13 MED ORDER — MIDAZOLAM HCL 2 MG/2ML IJ SOLN
INTRAMUSCULAR | Status: DC | PRN
  Administered 2024-08-13: 2 mg via INTRAVENOUS

## 2024-08-13 MED ORDER — POTASSIUM CHLORIDE CRYS ER 20 MEQ PO TBCR
20.0000 meq | EXTENDED_RELEASE_TABLET | Freq: Every day | ORAL | Status: DC
  Filled 2024-08-13: qty 1

## 2024-08-13 MED ORDER — POTASSIUM CHLORIDE IN NACL 20-0.9 MEQ/L-% IV SOLN
INTRAVENOUS | Status: DC
  Filled 2024-08-13: qty 1000

## 2024-08-13 MED ORDER — METHADONE HCL 10 MG PO TABS
45.0000 mg | ORAL_TABLET | Freq: Four times a day (QID) | ORAL | Status: DC
  Administered 2024-08-13 – 2024-08-15 (×7): 45 mg via ORAL
  Filled 2024-08-13 (×8): qty 5

## 2024-08-13 MED ORDER — OXYCODONE HCL 5 MG PO TABS
ORAL_TABLET | ORAL | Status: AC
  Filled 2024-08-13: qty 1

## 2024-08-13 MED ORDER — ACETAMINOPHEN 500 MG PO TABS
1000.0000 mg | ORAL_TABLET | Freq: Once | ORAL | Status: AC
  Administered 2024-08-14: 1000 mg via ORAL
  Filled 2024-08-13: qty 2

## 2024-08-13 MED ORDER — SODIUM CHLORIDE 0.9% FLUSH: 3.0000 mL | INTRAVENOUS | Status: DC | PRN

## 2024-08-13 MED ORDER — FENTANYL CITRATE (PF) 250 MCG/5ML IJ SOLN
INTRAMUSCULAR | Status: DC | PRN
  Administered 2024-08-13: 50 ug via INTRAVENOUS
  Administered 2024-08-13: 100 ug via INTRAVENOUS
  Administered 2024-08-13 (×2): 50 ug via INTRAVENOUS

## 2024-08-13 MED ORDER — HYDROMORPHONE HCL 1 MG/ML IJ SOLN
INTRAMUSCULAR | Status: AC
  Filled 2024-08-13: qty 1

## 2024-08-13 MED ORDER — FUROSEMIDE 40 MG PO TABS
40.0000 mg | ORAL_TABLET | Freq: Two times a day (BID) | ORAL | Status: DC
  Filled 2024-08-13 (×2): qty 1

## 2024-08-13 MED ORDER — LACTATED RINGERS IV SOLN
INTRAVENOUS | Status: DC
Start: 2024-08-13 — End: 2024-08-13

## 2024-08-13 MED ORDER — ACETAMINOPHEN 650 MG RE SUPP: 650.0000 mg | RECTAL | Status: DC | PRN

## 2024-08-13 MED ORDER — 0.9 % SODIUM CHLORIDE (POUR BTL) OPTIME
TOPICAL | Status: DC | PRN
  Administered 2024-08-13: 1000 mL

## 2024-08-13 MED ORDER — FENTANYL CITRATE (PF) 250 MCG/5ML IJ SOLN
INTRAMUSCULAR | Status: AC
  Filled 2024-08-13: qty 5

## 2024-08-13 MED ORDER — SUCCINYLCHOLINE CHLORIDE 200 MG/10ML IV SOSY
PREFILLED_SYRINGE | INTRAVENOUS | Status: AC
Start: 2024-08-13 — End: 2024-08-13
  Filled 2024-08-13: qty 10

## 2024-08-13 MED ORDER — CHLORHEXIDINE GLUCONATE 0.12 % MT SOLN
15.0000 mL | Freq: Once | OROMUCOSAL | Status: AC
  Administered 2024-08-13: 15 mL via OROMUCOSAL
  Filled 2024-08-13: qty 15

## 2024-08-13 MED ORDER — ROPINIROLE HCL 1 MG PO TABS
1.0000 mg | ORAL_TABLET | Freq: Every day | ORAL | Status: DC
Start: 2024-08-13 — End: 2024-08-15
  Filled 2024-08-13: qty 1

## 2024-08-13 MED ORDER — THROMBIN 20000 UNITS EX SOLR
CUTANEOUS | Status: DC | PRN
  Administered 2024-08-13: 20 mL via TOPICAL

## 2024-08-13 MED ORDER — SODIUM CHLORIDE 0.9% FLUSH
3.0000 mL | Freq: Two times a day (BID) | INTRAVENOUS | Status: DC
  Administered 2024-08-13 – 2024-08-14 (×2): 3 mL via INTRAVENOUS

## 2024-08-13 MED ORDER — ACETAMINOPHEN 500 MG PO TABS
1000.0000 mg | ORAL_TABLET | Freq: Once | ORAL | Status: AC
  Administered 2024-08-13: 1000 mg via ORAL
  Filled 2024-08-13: qty 2

## 2024-08-13 MED ORDER — PHENYLEPHRINE HCL-NACL 20-0.9 MG/250ML-% IV SOLN
INTRAVENOUS | Status: DC | PRN
  Administered 2024-08-13: 35 ug/min via INTRAVENOUS
  Administered 2024-08-13: 25 ug/min via INTRAVENOUS
  Administered 2024-08-13: 45 ug/min via INTRAVENOUS

## 2024-08-13 MED ORDER — LIDOCAINE 2% (20 MG/ML) 5 ML SYRINGE
INTRAMUSCULAR | Status: DC | PRN
  Administered 2024-08-13: 100 mg via INTRAVENOUS

## 2024-08-13 MED ORDER — POVIDONE-IODINE 7.5 % EX SOLN
Freq: Once | CUTANEOUS | Status: AC
  Filled 2024-08-13: qty 118

## 2024-08-13 MED ORDER — BUPROPION HCL ER (SR) 150 MG PO TB12
150.0000 mg | ORAL_TABLET | Freq: Every morning | ORAL | Status: DC
Start: 2024-08-14 — End: 2024-08-15
  Administered 2024-08-15: 150 mg via ORAL
  Filled 2024-08-13: qty 1

## 2024-08-13 MED ORDER — ALBUTEROL SULFATE (2.5 MG/3ML) 0.083% IN NEBU
2.5000 mg | INHALATION_SOLUTION | Freq: Once | RESPIRATORY_TRACT | Status: AC
  Administered 2024-08-13: 2.5 mg via RESPIRATORY_TRACT
  Filled 2024-08-13: qty 3

## 2024-08-13 MED ORDER — ZINC SULFATE 220 (50 ZN) MG PO CAPS
220.0000 mg | ORAL_CAPSULE | Freq: Every morning | ORAL | Status: DC
Start: 2024-08-14 — End: 2024-08-15
  Administered 2024-08-15: 220 mg via ORAL
  Filled 2024-08-13 (×2): qty 1

## 2024-08-13 MED ORDER — FLUTICASONE FUROATE-VILANTEROL 100-25 MCG/ACT IN AEPB
1.0000 | INHALATION_SPRAY | Freq: Every day | RESPIRATORY_TRACT | Status: DC
Start: 2024-08-13 — End: 2024-08-15
  Filled 2024-08-13: qty 28

## 2024-08-13 MED ORDER — FENOFIBRATE 160 MG PO TABS
160.0000 mg | ORAL_TABLET | Freq: Every day | ORAL | Status: DC
Start: 2024-08-13 — End: 2024-08-15
  Administered 2024-08-13: 160 mg via ORAL
  Filled 2024-08-13 (×2): qty 1

## 2024-08-13 MED ORDER — BISACODYL 5 MG PO TBEC
5.0000 mg | DELAYED_RELEASE_TABLET | Freq: Every day | ORAL | Status: DC | PRN
  Administered 2024-08-14: 5 mg via ORAL
  Filled 2024-08-13: qty 1

## 2024-08-13 MED ORDER — GABAPENTIN 300 MG PO CAPS
300.0000 mg | ORAL_CAPSULE | Freq: Three times a day (TID) | ORAL | Status: DC
  Administered 2024-08-13 – 2024-08-14 (×5): 300 mg via ORAL
  Filled 2024-08-13 (×5): qty 1

## 2024-08-13 MED ORDER — ADULT MULTIVITAMIN W/MINERALS CH
1.0000 | ORAL_TABLET | Freq: Every day | ORAL | Status: DC
  Administered 2024-08-13: 1 via ORAL
  Filled 2024-08-13 (×2): qty 1

## 2024-08-13 MED ORDER — LEVOTHYROXINE SODIUM 25 MCG PO TABS
125.0000 ug | ORAL_TABLET | Freq: Every day | ORAL | Status: DC
  Administered 2024-08-14 – 2024-08-15 (×2): 125 ug via ORAL
  Filled 2024-08-13 (×2): qty 1

## 2024-08-13 MED ORDER — SUCCINYLCHOLINE CHLORIDE 200 MG/10ML IV SOSY
PREFILLED_SYRINGE | INTRAVENOUS | Status: DC | PRN
  Administered 2024-08-13: 120 mg via INTRAVENOUS

## 2024-08-13 MED ORDER — PHENYLEPHRINE 80 MCG/ML (10ML) SYRINGE FOR IV PUSH (FOR BLOOD PRESSURE SUPPORT)
PREFILLED_SYRINGE | INTRAVENOUS | Status: AC
  Filled 2024-08-13: qty 10

## 2024-08-13 MED ORDER — CEFAZOLIN SODIUM-DEXTROSE 2-4 GM/100ML-% IV SOLN
2.0000 g | Freq: Three times a day (TID) | INTRAVENOUS | Status: AC
  Administered 2024-08-13 (×2): 2 g via INTRAVENOUS
  Filled 2024-08-13 (×2): qty 100

## 2024-08-13 MED ORDER — METHOCARBAMOL 500 MG PO TABS
500.0000 mg | ORAL_TABLET | Freq: Once | ORAL | Status: AC
  Administered 2024-08-13: 500 mg via ORAL

## 2024-08-13 MED ORDER — EPHEDRINE SULFATE-NACL 50-0.9 MG/10ML-% IV SOSY
PREFILLED_SYRINGE | INTRAVENOUS | Status: DC | PRN
  Administered 2024-08-13: 10 mg via INTRAVENOUS
  Administered 2024-08-13 (×2): 5 mg via INTRAVENOUS
  Administered 2024-08-13: 10 mg via INTRAVENOUS
  Administered 2024-08-13 (×2): 5 mg via INTRAVENOUS
  Administered 2024-08-13: 10 mg via INTRAVENOUS

## 2024-08-13 MED ORDER — DEXAMETHASONE SODIUM PHOSPHATE 10 MG/ML IJ SOLN
INTRAMUSCULAR | Status: AC
Start: 2024-08-13 — End: 2024-08-13
  Filled 2024-08-13: qty 1

## 2024-08-13 MED ORDER — ALUM & MAG HYDROXIDE-SIMETH 200-200-20 MG/5ML PO SUSP: 30.0000 mL | Freq: Four times a day (QID) | ORAL | Status: DC | PRN

## 2024-08-13 MED ORDER — SODIUM CHLORIDE 0.9 % IV SOLN: 250.0000 mL | INTRAVENOUS | Status: AC

## 2024-08-13 MED ORDER — ALBUTEROL SULFATE (2.5 MG/3ML) 0.083% IN NEBU: 3.0000 mL | INHALATION_SOLUTION | Freq: Four times a day (QID) | RESPIRATORY_TRACT | Status: DC | PRN

## 2024-08-13 MED ORDER — DEXAMETHASONE SODIUM PHOSPHATE 10 MG/ML IJ SOLN
INTRAMUSCULAR | Status: DC | PRN
  Administered 2024-08-13: 5 mg via INTRAVENOUS

## 2024-08-13 MED ORDER — OXYCODONE HCL 5 MG/5ML PO SOLN: 5.0000 mg | Freq: Once | ORAL | Status: DC | PRN

## 2024-08-13 MED ORDER — ONDANSETRON HCL 4 MG/2ML IJ SOLN: 4.0000 mg | Freq: Four times a day (QID) | INTRAMUSCULAR | Status: DC | PRN

## 2024-08-13 MED ORDER — THROMBIN 20000 UNITS EX SOLR
CUTANEOUS | Status: AC
  Filled 2024-08-13: qty 20000

## 2024-08-13 MED ORDER — LACTATED RINGERS IV SOLN: INTRAVENOUS | Status: DC | PRN

## 2024-08-13 MED ORDER — OXYCODONE HCL 5 MG PO TABS: 5.0000 mg | ORAL_TABLET | Freq: Once | ORAL | Status: DC | PRN

## 2024-08-13 MED ORDER — ARIPIPRAZOLE 5 MG PO TABS
5.0000 mg | ORAL_TABLET | Freq: Every morning | ORAL | Status: DC
  Administered 2024-08-15: 5 mg via ORAL
  Filled 2024-08-13 (×2): qty 1

## 2024-08-13 MED ORDER — SPIRONOLACTONE 25 MG PO TABS: 25.0000 mg | ORAL_TABLET | Freq: Every morning | ORAL | Status: DC

## 2024-08-13 MED ORDER — FLEET ENEMA RE ENEM: 1.0000 | ENEMA | Freq: Once | RECTAL | Status: DC | PRN

## 2024-08-13 MED ORDER — AMISULPRIDE (ANTIEMETIC) 5 MG/2ML IV SOLN: 10.0000 mg | Freq: Once | INTRAVENOUS | Status: DC | PRN

## 2024-08-13 MED ORDER — ALBUTEROL SULFATE HFA 108 (90 BASE) MCG/ACT IN AERS
INHALATION_SPRAY | RESPIRATORY_TRACT | Status: DC | PRN
Start: 2024-08-13 — End: 2024-08-13
  Administered 2024-08-13: 4 via RESPIRATORY_TRACT
  Administered 2024-08-13: 2 via RESPIRATORY_TRACT

## 2024-08-13 MED ORDER — ONDANSETRON HCL 4 MG/2ML IJ SOLN
INTRAMUSCULAR | Status: DC | PRN
  Administered 2024-08-13 (×2): 4 mg via INTRAVENOUS

## 2024-08-13 MED ORDER — BUSPIRONE HCL 15 MG PO TABS
15.0000 mg | ORAL_TABLET | Freq: Two times a day (BID) | ORAL | Status: DC | PRN
  Administered 2024-08-14: 30 mg via ORAL
  Filled 2024-08-13: qty 2

## 2024-08-13 MED ORDER — SENNOSIDES-DOCUSATE SODIUM 8.6-50 MG PO TABS
1.0000 | ORAL_TABLET | Freq: Every evening | ORAL | Status: DC | PRN
  Administered 2024-08-13: 1 via ORAL
  Filled 2024-08-13: qty 1

## 2024-08-13 MED ORDER — HYDROXYZINE HCL 50 MG PO TABS
50.0000 mg | ORAL_TABLET | Freq: Every day | ORAL | Status: DC
Start: 2024-08-13 — End: 2024-08-15
  Filled 2024-08-13 (×3): qty 1

## 2024-08-13 MED ORDER — ONDANSETRON HCL 4 MG/2ML IJ SOLN
INTRAMUSCULAR | Status: AC
  Filled 2024-08-13: qty 2

## 2024-08-13 MED ORDER — CEFAZOLIN SODIUM-DEXTROSE 2-4 GM/100ML-% IV SOLN
2.0000 g | INTRAVENOUS | Status: AC
Start: 2024-08-13 — End: 2024-08-13
  Administered 2024-08-13: 2 g via INTRAVENOUS
  Filled 2024-08-13: qty 100

## 2024-08-13 MED ORDER — OXYCODONE HCL 5 MG PO TABS
5.0000 mg | ORAL_TABLET | Freq: Once | ORAL | Status: AC
  Administered 2024-08-13: 5 mg via ORAL

## 2024-08-13 MED ORDER — ACETAMINOPHEN 325 MG PO TABS
650.0000 mg | ORAL_TABLET | ORAL | Status: DC | PRN
  Administered 2024-08-14 – 2024-08-15 (×2): 650 mg via ORAL
  Filled 2024-08-13 (×2): qty 2

## 2024-08-13 MED ORDER — INSULIN ASPART 100 UNIT/ML IJ SOLN
0.0000 [IU] | INTRAMUSCULAR | Status: DC | PRN
  Administered 2024-08-13: 2 [IU] via SUBCUTANEOUS
  Filled 2024-08-13: qty 1

## 2024-08-13 MED ORDER — PROPOFOL 10 MG/ML IV BOLUS
INTRAVENOUS | Status: AC
  Filled 2024-08-13: qty 20

## 2024-08-13 MED ORDER — INSULIN LISPRO (1 UNIT DIAL) 100 UNIT/ML (KWIKPEN): 5.0000 [IU] | PEN_INJECTOR | Freq: Every day | SUBCUTANEOUS | Status: DC | PRN

## 2024-08-13 MED ORDER — ORAL CARE MOUTH RINSE: 15.0000 mL | Freq: Once | OROMUCOSAL | Status: AC

## 2024-08-13 MED ORDER — MENTHOL 3 MG MT LOZG: 1.0000 | LOZENGE | OROMUCOSAL | Status: DC | PRN

## 2024-08-13 MED ORDER — ALLOPURINOL 100 MG PO TABS
100.0000 mg | ORAL_TABLET | Freq: Every morning | ORAL | Status: DC
Start: 2024-08-14 — End: 2024-08-15
  Administered 2024-08-15: 100 mg via ORAL
  Filled 2024-08-13: qty 1

## 2024-08-13 MED ORDER — DEXMEDETOMIDINE HCL IN NACL 80 MCG/20ML IV SOLN
INTRAVENOUS | Status: AC
Start: 2024-08-13 — End: 2024-08-13
  Filled 2024-08-13: qty 20

## 2024-08-13 MED ORDER — PROPOFOL 1000 MG/100ML IV EMUL
INTRAVENOUS | Status: AC
Start: 2024-08-13 — End: 2024-08-13
  Filled 2024-08-13: qty 100

## 2024-08-13 MED ORDER — PROPOFOL 500 MG/50ML IV EMUL
INTRAVENOUS | Status: DC | PRN
  Administered 2024-08-13: 35 mg via INTRAVENOUS
  Administered 2024-08-13 (×2): 200 ug/kg/min via INTRAVENOUS
  Administered 2024-08-13: 90 ug/kg/min via INTRAVENOUS

## 2024-08-13 MED ORDER — EPHEDRINE 5 MG/ML INJ
INTRAVENOUS | Status: AC
  Filled 2024-08-13: qty 5

## 2024-08-13 MED ORDER — DOCUSATE SODIUM 100 MG PO CAPS
100.0000 mg | ORAL_CAPSULE | Freq: Two times a day (BID) | ORAL | Status: DC
  Administered 2024-08-13 – 2024-08-14 (×2): 100 mg via ORAL
  Filled 2024-08-13 (×3): qty 1

## 2024-08-13 MED ORDER — DEXMEDETOMIDINE HCL IN NACL 80 MCG/20ML IV SOLN
INTRAVENOUS | Status: DC | PRN
  Administered 2024-08-13: 8 ug via INTRAVENOUS
  Administered 2024-08-13 (×2): 12 ug via INTRAVENOUS

## 2024-08-13 MED ORDER — AMPHETAMINE-DEXTROAMPHETAMINE 10 MG PO TABS
30.0000 mg | ORAL_TABLET | Freq: Two times a day (BID) | ORAL | Status: DC
Start: 2024-08-13 — End: 2024-08-15
  Administered 2024-08-13 – 2024-08-15 (×3): 30 mg via ORAL
  Filled 2024-08-13 (×3): qty 3

## 2024-08-13 MED ORDER — PHENOL 1.4 % MT LIQD: 1.0000 | OROMUCOSAL | Status: DC | PRN

## 2024-08-13 MED ORDER — HYDROMORPHONE HCL 1 MG/ML IJ SOLN
0.2500 mg | INTRAMUSCULAR | Status: DC | PRN
  Administered 2024-08-13: 0.5 mg via INTRAVENOUS
  Administered 2024-08-13: 0.25 mg via INTRAVENOUS

## 2024-08-13 MED ORDER — MIDAZOLAM HCL 2 MG/2ML IJ SOLN
INTRAMUSCULAR | Status: AC
  Filled 2024-08-13: qty 2

## 2024-08-13 MED ORDER — TIZANIDINE HCL 4 MG PO TABS
4.0000 mg | ORAL_TABLET | Freq: Four times a day (QID) | ORAL | Status: DC | PRN
  Administered 2024-08-13 – 2024-08-15 (×6): 4 mg via ORAL
  Filled 2024-08-13 (×6): qty 1

## 2024-08-13 MED ORDER — PROPOFOL 10 MG/ML IV BOLUS
INTRAVENOUS | Status: DC | PRN
Start: 2024-08-13 — End: 2024-08-13
  Administered 2024-08-13: 40 mg via INTRAVENOUS
  Administered 2024-08-13: 80 mg via INTRAVENOUS
  Administered 2024-08-13: 200 mg via INTRAVENOUS

## 2024-08-13 SURGICAL SUPPLY — 67 items
BAG COUNTER SPONGE SURGICOUNT (BAG) ×1 IMPLANT
BENZOIN TINCTURE PRP APPL 2/3 (GAUZE/BANDAGES/DRESSINGS) IMPLANT
BLADE CLIPPER SURG (BLADE) IMPLANT
BLADE SURG 10 STRL SS (BLADE) ×1 IMPLANT
CLAMP PIN LRG 6 POSITION (Clamp) IMPLANT
COVER BACK TABLE 80X110 HD (DRAPES) ×1 IMPLANT
COVER SURGICAL LIGHT HANDLE (MISCELLANEOUS) ×1 IMPLANT
DRAPE C-ARM 42X72 X-RAY (DRAPES) ×1 IMPLANT
DRAPE C-ARMOR (DRAPES) ×1 IMPLANT
DRAPE POUCH INSTRU U-SHP 10X18 (DRAPES) ×1 IMPLANT
DRAPE SURG 17X23 STRL (DRAPES) ×4 IMPLANT
DURAPREP 26ML APPLICATOR (WOUND CARE) ×1 IMPLANT
ELECT BLADE 6.5 EXT (BLADE) ×1 IMPLANT
ELECT CAUTERY BLADE 6.4 (BLADE) ×1 IMPLANT
ELECT NVM5 SURFACE MEP/EMG (ELECTRODE) IMPLANT
ELECTRODE REM PT RTRN 9FT ADLT (ELECTROSURGICAL) ×1 IMPLANT
GAUZE 4X4 16PLY ~~LOC~~+RFID DBL (SPONGE) ×1 IMPLANT
GAUZE SPONGE 4X4 12PLY STRL (GAUZE/BANDAGES/DRESSINGS) IMPLANT
GLOVE BIO SURGEON STRL SZ 6.5 (GLOVE) ×2 IMPLANT
GLOVE BIO SURGEON STRL SZ8 (GLOVE) ×1 IMPLANT
GLOVE BIOGEL PI IND STRL 7.0 (GLOVE) ×1 IMPLANT
GLOVE BIOGEL PI IND STRL 8 (GLOVE) ×1 IMPLANT
GLOVE SURG ENC MOIS LTX SZ6.5 (GLOVE) ×2 IMPLANT
GOWN STRL REUS W/ TWL LRG LVL3 (GOWN DISPOSABLE) ×1 IMPLANT
GOWN STRL REUS W/ TWL XL LVL3 (GOWN DISPOSABLE) ×2 IMPLANT
GRAFT BNE MATRIX VG FRMBL L 10 (Bone Implant) IMPLANT
IV CATH 14GX2 1/4 (CATHETERS) ×1 IMPLANT
KIT BASIN OR (CUSTOM PROCEDURE TRAY) ×1 IMPLANT
KIT DILATOR XLIF 5 (KITS) IMPLANT
KIT SURGICAL ACCESS MAXCESS (KITS) IMPLANT
KIT TURNOVER KIT B (KITS) ×1 IMPLANT
MARKER SKIN DUAL TIP RULER LAB (MISCELLANEOUS) ×1 IMPLANT
MODULE EMG NDL SSEP NVM5 (NEUROSURGERY SUPPLIES) IMPLANT
MODULE EMG NEEDLE SSEP NVM5 (NEUROSURGERY SUPPLIES) ×1 IMPLANT
NDL HYPO 25GX1X1/2 BEV (NEEDLE) ×1 IMPLANT
NDL SPNL 18GX3.5 QUINCKE PK (NEEDLE) ×1 IMPLANT
NEEDLE HYPO 25GX1X1/2 BEV (NEEDLE) ×1 IMPLANT
NEEDLE SPNL 18GX3.5 QUINCKE PK (NEEDLE) ×1 IMPLANT
PACK LAMINECTOMY ORTHO (CUSTOM PROCEDURE TRAY) ×1 IMPLANT
PACK UNIVERSAL I (CUSTOM PROCEDURE TRAY) ×1 IMPLANT
PAD ARMBOARD POSITIONER FOAM (MISCELLANEOUS) ×2 IMPLANT
PLATE ADIRA 2H 17 RLX (Plate) IMPLANT
PUTTY DBX 2.5CC DEPUY (Putty) IMPLANT
SCREW ALIGN ADIRA M4.5 GRN (Screw) IMPLANT
SCREW ANT CERV SD IND 5.5X35 (Screw) IMPLANT
SCREW VA ADIRA 5.5X50 (Screw) IMPLANT
SOLN 0.9% NACL 1000 ML (IV SOLUTION) ×2 IMPLANT
SOLN 0.9% NACL POUR BTL 1000ML (IV SOLUTION) ×2 IMPLANT
SOLN STERILE WATER 1000 ML (IV SOLUTION) ×1 IMPLANT
SOLN STERILE WATER BTL 1000 ML (IV SOLUTION) ×1 IMPLANT
SPACER RISE-L 18X50 7-14MM (Spacer) IMPLANT
SPONGE INTESTINAL PEANUT (DISPOSABLE) ×2 IMPLANT
SPONGE SURGIFOAM ABS GEL 100 (HEMOSTASIS) IMPLANT
SPONGE T-LAP 4X18 ~~LOC~~+RFID (SPONGE) ×1 IMPLANT
STAPLER SKIN PROX 35W (STAPLE) ×1 IMPLANT
STRIP CLOSURE SKIN 1/2X4 (GAUZE/BANDAGES/DRESSINGS) IMPLANT
SURGIFLO W/THROMBIN 8M KIT (HEMOSTASIS) IMPLANT
SUT MNCRL AB 4-0 PS2 18 (SUTURE) ×1 IMPLANT
SUT VIC AB 0 CT1 18XCR BRD 8 (SUTURE) ×1 IMPLANT
SUT VIC AB 1 CT1 18XCR BRD 8 (SUTURE) ×1 IMPLANT
SUT VIC AB 2-0 CT2 18 VCP726D (SUTURE) ×1 IMPLANT
SYR BULB IRRIG 60ML STRL (SYRINGE) ×1 IMPLANT
SYR CONTROL 10ML LL (SYRINGE) ×1 IMPLANT
TOWEL GREEN STERILE (TOWEL DISPOSABLE) ×1 IMPLANT
TOWEL GREEN STERILE FF (TOWEL DISPOSABLE) ×1 IMPLANT
TRAY FOLEY MTR SLVR 16FR STAT (SET/KITS/TRAYS/PACK) ×1 IMPLANT
YANKAUER SUCT BULB TIP NO VENT (SUCTIONS) ×1 IMPLANT

## 2024-08-13 NOTE — Anesthesia Postprocedure Evaluation (Signed)
 Anesthesia Post Note  Patient: Kamie Korber  Procedure(s) Performed: LUMBAR THREE LUMBAR FOUR LATERAL INTERBODY FUSION WITH INSTRUMENTATION AND ALLOGRAFT (Left)     Patient location during evaluation: PACU Anesthesia Type: General Level of consciousness: awake Pain management: pain level controlled Vital Signs Assessment: post-procedure vital signs reviewed and stable Respiratory status: spontaneous breathing, nonlabored ventilation and respiratory function stable Cardiovascular status: blood pressure returned to baseline and stable Postop Assessment: no apparent nausea or vomiting Anesthetic complications: no   No notable events documented.  Last Vitals:  Vitals:   08/13/24 1130 08/13/24 1200  BP: (!) 117/57 107/61  Pulse: (!) 56 (!) 52  Resp: 12 14  Temp:    SpO2: 97% 93%    Last Pain:  Vitals:   08/13/24 1130  TempSrc:   PainSc: Asleep                 Delon Aisha Arch

## 2024-08-13 NOTE — Progress Notes (Signed)
    This pt is in a methadone  clinic at [facility]. She is not to be utilizing any other narcotics outside o Methadone , they will not work. She is on a maintenance dose of 185mg  daily. Her MD who prescribes this recommends dividing the dose into 45mg  Q6hrs for better pain control with increasing her tizanidine  to q 6hrs. No additional narocotics/IV breakthrough meds recommended per Dr Mennie at [place]. She was on percocet through pain management, but this is to be discontinued as it will not function with her high maintenance dose of methadone .   Ileana Clara, PA-C

## 2024-08-13 NOTE — Anesthesia Procedure Notes (Signed)
 Procedure Name: Intubation Date/Time: 08/13/2024 7:47 AM  Performed by: Jolynn Mage, CRNAPre-anesthesia Checklist: Patient identified, Patient being monitored, Timeout performed, Emergency Drugs available and Suction available Patient Re-evaluated:Patient Re-evaluated prior to induction Oxygen Delivery Method: Circle System Utilized Preoxygenation: Pre-oxygenation with 100% oxygen Induction Type: IV induction Ventilation: Mask ventilation without difficulty and Oral airway inserted - appropriate to patient size Laryngoscope Size: Cleotilde and 2 Grade View: Grade II Tube type: Oral Tube size: 7.0 mm Number of attempts: 1 Airway Equipment and Method: Stylet Placement Confirmation: ETT inserted through vocal cords under direct vision, positive ETCO2 and breath sounds checked- equal and bilateral Secured at: 23 cm Tube secured with: Tape Dental Injury: Teeth and Oropharynx as per pre-operative assessment

## 2024-08-13 NOTE — Anesthesia Preprocedure Evaluation (Addendum)
 Anesthesia Evaluation  Patient identified by MRN, date of birth, ID band Patient awake    Reviewed: Allergy & Precautions, NPO status , Patient's Chart, lab work & pertinent test results, reviewed documented beta blocker date and time   History of Anesthesia Complications (+) PONV and history of anesthetic complications (wakes up in panic mode)  Airway Mallampati: II  TM Distance: >3 FB    Comment: Previous grade I view with Glidescope 3 Dental no notable dental hx. (+) Teeth Intact, Dental Advisory Given   Pulmonary neg shortness of breath, asthma , neg sleep apnea, COPD,  COPD inhaler, neg recent URI, Current Smoker and Patient abstained from smoking.   Pulmonary exam normal breath sounds clear to auscultation       Cardiovascular hypertension, Pt. on home beta blockers and Pt. on medications (-) angina (-) Past MI, (-) Cardiac Stents and (-) CABG Normal cardiovascular exam(-) dysrhythmias  Rhythm:Regular Rate:Normal  HLD   Neuro/Psych  PSYCHIATRIC DISORDERS Anxiety Depression     Neuromuscular disease    GI/Hepatic ,GERD  Medicated,,(+)     substance abuse    Endo/Other  diabetes, Well Controlled, Type 2, Insulin  DependentHypothyroidism    Renal/GU Renal InsufficiencyRenal disease     Musculoskeletal  (+) Arthritis , Osteoarthritis,  Lumbar spinal stenosis   Abdominal  (+) + obese  Peds  Hematology negative hematology ROS (+) Lab Results      Component                Value               Date                      WBC                      11.1 (H)            08/08/2024                HGB                      14.6                08/08/2024                HCT                      42.8                08/08/2024                MCV                      90.9                08/08/2024                PLT                      438 (H)             08/08/2024              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   Reproductive/Obstetrics                              Anesthesia Physical  Anesthesia Plan  ASA: 3  Anesthesia Plan: General   Post-op Pain Management: Precedex , Ofirmev  IV (intra-op)* and Dilaudid  IV   Induction: Intravenous  PONV Risk Score and Plan: 4 or greater and Ondansetron , Dexamethasone , Treatment may vary due to age or medical condition and Midazolam   Airway Management Planned: Oral ETT and Video Laryngoscope Planned  Additional Equipment: None  Intra-op Plan:   Post-operative Plan: Extubation in OR  Informed Consent: I have reviewed the patients History and Physical, chart, labs and discussed the procedure including the risks, benefits and alternatives for the proposed anesthesia with the patient or authorized representative who has indicated his/her understanding and acceptance.     Dental advisory given  Plan Discussed with: Anesthesiologist  Anesthesia Plan Comments: (Nebulizer treatment given in preop. Risks of general anesthesia discussed including, but not limited to, sore throat, hoarse voice, chipped/damaged teeth, injury to vocal cords, nausea and vomiting, allergic reactions, lung infection, heart attack, stroke, and death. All questions answered.   PAT note by Lynwood Hope, PA-C: 48 year old female current smoker with pertinent history including PONV, HTN, GERD on PPI, asthma/COPD, CKD 3, chronic pain, hypothyroidism, IDDM2, prior substance abuse, HLD, depression, anxiety, obesity BMI 36.  She was recently referred to pulmonology for sleep consult due to suspected sleep apnea.  Unfortunately, she never completed workup for this.  Presumed OSA with STOP-BANG score of 6, Epworth 11, and report of excessive daytime sleepiness, nocturnal apneic events, restless sleep.  Per anesthesia records, GlideScope used electively for intubation 04/03/2022 due to, neck discomfort, short thick neck, small mouth opening.  Preop labs reviewed,  creatinine elevated 1.73 consistent with history of CKD 3, otherwise unremarkable.  DM2 well-controlled with A1c 5.2.  EKG 01/06/2024: Sinus rhythm.  Rate 65. Nonspecific intraventricular conduction delay. Borderline repolarization abnormality  TTE 02/06/2020 (Care Everywhere): Summary   1. Technically difficult study.    2. The left ventricle is normal in size with normal wall thickness.    3. The left ventricular systolic function is normal, LVEF is visually  estimated at 60-65%.    4. The right ventricle is normal in size, with normal systolic function.   )         Anesthesia Quick Evaluation

## 2024-08-13 NOTE — Transfer of Care (Signed)
 Immediate Anesthesia Transfer of Care Note  Patient: Natasha Cain  Procedure(s) Performed: LUMBAR THREE LUMBAR FOUR LATERAL INTERBODY FUSION WITH INSTRUMENTATION AND ALLOGRAFT (Left)  Patient Location: PACU  Anesthesia Type:General  Level of Consciousness: awake, alert , patient cooperative, and responds to stimulation  Airway & Oxygen Therapy: Patient Spontanous Breathing and Patient connected to face mask oxygen  Post-op Assessment: Report given to RN, Post -op Vital signs reviewed and stable, and Patient moving all extremities X 4  Post vital signs: Reviewed and stable  Last Vitals:  Vitals Value Taken Time  BP 115/44 08/13/24 10:34  Temp 36.7 C 08/13/24 10:34  Pulse 64 08/13/24 10:39  Resp 9 08/13/24 10:39  SpO2 92 % 08/13/24 10:39  Vitals shown include unfiled device data.  Last Pain:  Vitals:   08/13/24 0634  TempSrc:   PainSc: 7          Complications: No notable events documented.

## 2024-08-13 NOTE — Op Note (Signed)
 PATIENT NAME: Natasha Cain   MEDICAL RECORD NO.:   [id number]    DATE OF BIRTH: 09-14-1976   DATE OF PROCEDURE: 08/13/2024                               OPERATIVE REPORT   PREOPERATIVE DIAGNOSES: 1.  Bilateral lumabr radiulopathy 2.  Status post previous fusion spanning L4-S1 3.  L3-4 degenerative disc disease and spinal stenosis   POSTOPERATIVE DIAGNOSES: 1.  Bilateral lumabr radiulopathy 2.  Status post previous fusion spanning L4-S1 3.  L3-4 degenerative disc disease and spinal stenosis   PROCEDURE:  1.  Left-sided lateral interbody fusion, L3/4  via direct lateral retroperitoneal approach. 2.  Insertion of interbody device x1 (Expandable 18 mm x 55 mm Globus intervertebral spacer). 3.  Placement of anterior instrumentation, L3/4 4.  Use of morselized allograft -- ViviGen.   5.  Intraoperative use of fluoroscopy.   SURGEON:  Oneil Priestly, MD   ASSISTANT:  Ileana Clara PA-C.   ANESTHESIA:  General endotracheal anesthesia.   COMPLICATIONS:  None.   DISPOSITION:  Stable.   ESTIMATED BLOOD LOSS:  Minimal.   INDICATIONS:  Briefly, [name] is a very pleasant 48 year-old female who is many years status post a lumbar fusion spanning L4-S1.  More recently, she has been having severe pain in her back and bilateral legs.  The patient's imaging studies did reveal severe disc degeneration and stenosis at L3-4, the level above her fusion.  She did present today for a left-sided lateral interbody fusion with instrumentation.  The plan was to return tomorrow for a posterior fusion with revision instrumentation, and likely decompression.  The patient was made aware of the risks and limitations associated with surgery and did wish to proceed.    DESCRIPTION OF PROCEDURE:  On 08/13/2024, the patient was brought to surgery and general endotracheal anesthesia was administered.  The patient was placed in the lateral decubitus position, with the left side up.  Neurologic monitoring leads were  placed by the monitoring technician.  The patient's torso and lower extremities were secured to the bed.  The patient's hips and knees were flexed in order to lessen the tension on the psoas musculature.  The left flank was then prepped and draped in the usual sterile fashion.  The bed was flexed, in order to optimize exposure to the L3/4 intervertebral space.  After a timeout procedure was performed, a left-sided transverse incision was made over the left flank overlying the L3/4 intervertebral space.  The retroperitoneal space was encountered, after dissection through the oblique musculature.  The peritoneum was bluntly swept anteriorly, and the psoas was readily identified.  I did use a series of dilators to dock over the L3/4 intervertebral space.  I did use neurologic monitoring while placing the dilators, in order to ensure that there were no neurologic structures in the immediate vicinity of the dilators.  The lumbar plexus was noted to be posterior.  A self-retaining retractor was placed, and was attached to a rigid arm.  The retractor was very gently dilated and a shim was placed into the L3/4 intervertebral space.  I then used a knife to perform an annulotomy at the lateral aspect of the L3/4 intervertebral space.  I then used a series of curettes and pituitary rongeurs in order to perform a thorough and complete L3/4 intervertebral diskectomy.  The contralateral annulus was released.  I then placed a series of intervertebral  spacer trials, and I did feel that a 18 mm x 50 mm spacer would be the most appropriate fit.  The appropriate spacer was then packed with ViviGen and tamped into position, and expanded to 10 mm in height.  I was very pleased with the final resting position of the intervertebral spacer.  Excellent height restoration was noted on the right side, the side of the preoperative collapse. At this point, a lateral plate was placed over the lateral aspect of the L3 and L4 vertebral  bodies.   The plate was not integral to the intervertebral spacer.  I then used an awl to prepare the trajectory of the L3 and L4 vertebral body screws.  A 50 mm screw was placed into the L3 vertebral body, and a 35 mm screw was placed into the L4 vertebral body.  The screws were then locked into the plate.  I was very pleased with the final AP and lateral fluoroscopic images and the excellent restoration of disk height identified on both AP and lateral images.  At this point, the wound was copiously irrigated.  The fascia, internal, and external oblique musculature was closed using #1 Vicryl.  The subcutaneous layer was  closed using 2-0 Vicryl and the skin was closed using 4-0 Monocryl.     Of note, I did use neurologic monitoring throughout the entire surgery, and there was no sustained EMG activity noted throughout the entire surgery. All instrument counts were correct at the termination of the procedure.   Of note, Ileana Clara was my assistant throughout surgery, and did aid in retraction, placement of the hardware, suctioning, and closure.  Oneil Priestly, MD

## 2024-08-13 NOTE — H&P (Signed)
 PREOPERATIVE H&P  Chief Complaint: Bilateral leg pain  HPI: Natasha Cain is a 48 y.o. female who presents with ongoing pain in the bilateral legs  MRI reveals severe stenosis at L3/4, the level above the patient's fusion  Patient has failed multiple forms of conservative care and continues to have pain (see office notes for additional details regarding the patient's full course of treatment)  Past Medical History:  Diagnosis Date   Anxiety    Arthritis    Asthma    Complication of anesthesia    patietn wakes up in panic mode   COPD (chronic obstructive pulmonary disease) (HCC)    Depression    Diabetes (HCC)    Hyperlipidemia    Hypertension    Hypothyroidism    PONV (postoperative nausea and vomiting)    Substance abuse (HCC)    Thyroid  disease    Past Surgical History:  Procedure Laterality Date   BACK SURGERY     CESAREAN SECTION  05/22/2007   CHOLECYSTECTOMY  11/21/2007   FOOT SURGERY Bilateral    INCISIONAL HERNIA REPAIR N/A 04/03/2022   Procedure: HERNIA REPAIR INCISIONAL W/ MESH;  Surgeon: Mavis Anes, MD;  Location: [facility];  Service: General;  Laterality: N/A;   KNEE SURGERY  07/22/2011   left   ORIF TOE FRACTURE Left 11/14/2022   Procedure: OPEN REDUCTION INTERNAL FIXATION (ORIF) METATARSAL (TOE) FRACTURE;  Surgeon: Beverley Evalene BIRCH, MD;  Location: [facility] [place];  Service: Orthopedics;  Laterality: Left;   ORIF TOE FRACTURE Right 12/29/2022   Procedure: OPEN REDUCTION INTERNAL FIXATION (ORIF) METATARSAL (TOE) FRACTURE;  Surgeon: Beverley Evalene BIRCH, MD;  Location: [facility];  Service: Orthopedics;  Laterality: Right;   SPINE SURGERY  05/15/2012   Social History   Socioeconomic History   Marital status: Single    Spouse name: Not on file   Number of children: Not on file   Years of education: Not on file   Highest education level: Not on file  Occupational History   Not on file  Tobacco Use   Smoking status: Every  Day    Current packs/day: 0.50    Types: Cigarettes   Smokeless tobacco: Not on file  Substance and Sexual Activity   Alcohol use: No   Drug use: No   Sexual activity: Never    Birth control/protection: None  Other Topics Concern   Not on file  Social History Narrative   Not on file   Social Drivers of Health   Financial Resource Strain: Not on file  Food Insecurity: No Food Insecurity (02/26/2024)   [facility]   Hunger Vital Sign    Within the past 12 months, you worried that your food would run out before you got the money to buy more.: Never true    Within the past 12 months, the food you bought just didn't last and you didn't have money to get more.: Never true  Transportation Needs: No Transportation Needs (02/26/2024)   Received from [facility]   PRAPARE - Transportation    Lack of Transportation (Medical): No    Lack of Transportation (Non-Medical): No  Physical Activity: Inactive (02/26/2024)   Received from [facility]   Exercise Vital Sign    On average, how many days per week do you engage in moderate to strenuous exercise (like a brisk walk)?: 0 days    On average, how many minutes do you engage in exercise at this  level?: 0 min  Stress: Not on file  Social Connections: Not on file   Family History  Problem Relation Age of Onset   Cancer Mother        breast   Cancer Maternal Aunt        skin cancer on face   Cancer Paternal Uncle        colon   Cancer Maternal Grandfather        lung   Cancer Paternal Grandfather        colon   Cancer Cousin        colon   Allergies  Allergen Reactions   Cymbalta [Duloxetine Hcl] Anaphylaxis   Latex Rash   Zorvolex [Diclofenac] Swelling    Stroke like symptoms including tingling in arms and tongue in addition to swelling   Ambien  [Zolpidem  Tartrate] Other (See Comments)    Hallucinations   Benadryl [Diphenhydramine] Other (See Comments)    RESTLESS LEG INCREASED   Codeine Rash   Darvon  [Propoxyphene] Rash    Darvocet   Mobic  [Meloxicam ] Other (See Comments)    Numbness in arms   Sulfa Antibiotics Hives and Rash   Prior to Admission medications   Medication Sig Start Date End Date Taking? Authorizing Provider  acetaminophen  (TYLENOL ) 500 MG tablet Take 2 tablets (1,000 mg total) by mouth every 6 (six) hours as needed for mild pain or moderate pain. 11/14/22  Yes Gawne, Meghan M, PA-C  albuterol  (VENTOLIN  HFA) 108 (90 Base) MCG/ACT inhaler Inhale 2 puffs into the lungs every 6 (six) hours as needed for wheezing or shortness of breath.   Yes [provider]  allopurinol  (ZYLOPRIM ) 100 MG tablet Take 100 mg by mouth in the morning. 04/24/24  Yes [provider]  amphetamine -dextroamphetamine  (ADDERALL) 30 MG tablet Take 30 mg by mouth 2 (two) times daily. 10/25/21  Yes [provider]  ARIPiprazole  (ABILIFY ) 5 MG tablet Take 5 mg by mouth in the morning.   Yes [provider]  aspirin  EC 81 MG tablet Take 81 mg by mouth in the morning. Swallow whole.   Yes [provider]  atenolol  (TENORMIN ) 50 MG tablet Take 100 mg by mouth in the morning. 03/20/22  Yes [provider]  buPROPion  (WELLBUTRIN  SR) 150 MG 12 hr tablet Take 150 mg by mouth in the morning.   Yes [provider]  busPIRone  (BUSPAR ) 30 MG tablet Take 15-30 mg by mouth 2 (two) times daily as needed (anxiety).   Yes [provider]  fenofibrate  (TRICOR ) 145 MG tablet Take 145 mg by mouth every evening.   Yes [provider]  fluticasone -salmeterol (ADVAIR) 100-50 MCG/ACT AEPB Inhale 1 puff into the lungs daily.   Yes [provider]  furosemide  (LASIX ) 20 MG tablet Take 40 mg by mouth 2 (two) times daily.   Yes [provider]  gabapentin  (NEURONTIN ) 300 MG capsule Take 300 mg by mouth 3 (three) times daily. 11/30/20  Yes [provider]  hydrOXYzine  (VISTARIL ) 50 MG capsule Take 50-100 mg by mouth at bedtime. 10/25/21   Yes [provider]  insulin  lispro (HUMALOG ) 100 UNIT/ML KwikPen Inject 5 Units into the skin daily as needed (elevated blood sugar).   Yes [provider]  levothyroxine  (SYNTHROID ) 125 MCG tablet Take 125 mcg by mouth daily before breakfast. 10/21/20  Yes [provider]  METHADONE  HCL PO Take 175 mg by mouth daily.   Yes [provider]  Multiple Vitamins-Minerals (MULTIVITAMIN WITH MINERALS) tablet  Take 1 tablet by mouth daily.   Yes [provider]  Omega-3 Fatty Acids (FISH OIL) 1000 MG CAPS Take 3 capsules by mouth at bedtime.   Yes [provider]  omeprazole (PRILOSEC) 40 MG capsule Take 80 mg by mouth every morning.   Yes [provider]  oxyCODONE -acetaminophen  (PERCOCET) 7.5-325 MG tablet Take 1 tablet by mouth in the morning, at noon, in the evening, and at bedtime.   Yes [provider]  potassium chloride  SA (KLOR-CON  M) 20 MEQ tablet Take 1 tablet (20 mEq total) by mouth 2 (two) times daily. Patient taking differently: Take 20 mEq by mouth daily. 01/06/24  Yes Marlo Asberry SAILOR, MD  rOPINIRole  (REQUIP ) 1 MG tablet Take 1 mg by mouth at bedtime.   Yes [provider]  sertraline  (ZOLOFT ) 100 MG tablet Take 200 mg by mouth in the morning. 05/31/16  Yes [provider]  spironolactone  (ALDACTONE ) 25 MG tablet Take 25 mg by mouth in the morning. 04/18/24  Yes [provider]  tiZANidine  (ZANAFLEX ) 4 MG tablet Take 4 mg by mouth every 8 (eight) hours as needed for muscle spasms.   Yes [provider]  cephALEXin  (KEFLEX ) 500 MG capsule Take 1 capsule (500 mg total) by mouth 4 (four) times daily. 01/06/24   Marlo Asberry SAILOR, MD  lidocaine  (LIDODERM ) 5 % Place 1 patch onto the skin daily. Remove & Discard patch within 12 hours or as directed by MD Patient taking differently: Place 1 patch onto the skin daily as needed (pain.). Remove & Discard patch within 12 hours or as directed by MD  07/08/22   Theotis Cameron HERO, PA-C  topiramate  (TOPAMAX ) 100 MG tablet Take 100 mg by mouth at bedtime. 10/29/20   [provider]  zinc  gluconate 50 MG tablet Take 50 mg by mouth in the morning.    [provider]     All other systems have been reviewed and were otherwise negative with the exception of those mentioned in the HPI and as above.  Physical Exam: Vitals:   08/13/24 0614  BP: (P) 111/60  Pulse: (P) 60  Resp: (P) 18  Temp: (P) 98 F (36.7 C)  SpO2: (P) 95%    Body mass index is 35.99 kg/m (pended).  General: Alert, no acute distress Cardiovascular: No pedal edema Respiratory: No cyanosis, no use of accessory musculature Skin: No lesions in the area of chief complaint Neurologic: Sensation intact distally Psychiatric: Patient is competent for consent with normal mood and affect Lymphatic: No axillary or cervical lymphadenopathy   Assessment/Plan: SPINAL STENOSIS ABOVE PATIENT'S FUSION L4-S1 Plan for Procedure(s): ANTERIOR LATERAL LUMBAR FUSION 1 LEVEL, L3/4   Oneil LITTIE Priestly, MD 08/13/2024 6:49 AM

## 2024-08-14 ENCOUNTER — Inpatient Hospital Stay (HOSPITAL_COMMUNITY): Payer: Self-pay | Admitting: Anesthesiology

## 2024-08-14 ENCOUNTER — Other Ambulatory Visit: Payer: Self-pay

## 2024-08-14 ENCOUNTER — Inpatient Hospital Stay (HOSPITAL_COMMUNITY)
Admission: RE | Disposition: A | Discharge status: Home / Self Care | Payer: Self-pay | Source: Home / Self Care | Attending: Orthopedic Surgery

## 2024-08-14 ENCOUNTER — Inpatient Hospital Stay (HOSPITAL_COMMUNITY)

## 2024-08-14 ENCOUNTER — Inpatient Hospital Stay (HOSPITAL_COMMUNITY): Admission: RE | Admit: 2024-08-14 | Source: Home / Self Care | Admitting: Orthopedic Surgery

## 2024-08-14 DIAGNOSIS — M5116 Intervertebral disc disorders with radiculopathy, lumbar region: Secondary | ICD-10-CM

## 2024-08-14 DIAGNOSIS — I10 Essential (primary) hypertension: Secondary | ICD-10-CM

## 2024-08-14 DIAGNOSIS — F418 Other specified anxiety disorders: Secondary | ICD-10-CM

## 2024-08-14 DIAGNOSIS — E039 Hypothyroidism, unspecified: Secondary | ICD-10-CM | POA: Diagnosis not present

## 2024-08-14 LAB — GLUCOSE, CAPILLARY
Glucose-Capillary: 109 mg/dL — ABNORMAL HIGH (ref 70–99)
Glucose-Capillary: 150 mg/dL — ABNORMAL HIGH (ref 70–99)
Glucose-Capillary: 97 mg/dL (ref 70–99)
Glucose-Capillary: 108 mg/dL — ABNORMAL HIGH (ref 70–99)

## 2024-08-14 SURGERY — POSTERIOR LUMBAR FUSION 1 LEVEL
Anesthesia: General | Site: Spine Lumbar

## 2024-08-14 MED ORDER — KETAMINE HCL 50 MG/5ML IJ SOSY
PREFILLED_SYRINGE | INTRAMUSCULAR | Status: AC
  Filled 2024-08-14: qty 5

## 2024-08-14 MED ORDER — LACTATED RINGERS IV SOLN: INTRAVENOUS | Status: DC

## 2024-08-14 MED ORDER — ROCURONIUM BROMIDE 10 MG/ML (PF) SYRINGE
PREFILLED_SYRINGE | INTRAVENOUS | Status: AC
Start: 2024-08-14 — End: 2024-08-14
  Filled 2024-08-14: qty 10

## 2024-08-14 MED ORDER — CHLORHEXIDINE GLUCONATE 0.12 % MT SOLN: 15.0000 mL | Freq: Once | OROMUCOSAL | Status: AC

## 2024-08-14 MED ORDER — ROCURONIUM BROMIDE 10 MG/ML (PF) SYRINGE
PREFILLED_SYRINGE | INTRAVENOUS | Status: DC | PRN
  Administered 2024-08-14: 10 mg via INTRAVENOUS
  Administered 2024-08-14: 90 mg via INTRAVENOUS

## 2024-08-14 MED ORDER — HYDROMORPHONE HCL 1 MG/ML IJ SOLN
INTRAMUSCULAR | Status: AC
  Filled 2024-08-14: qty 1

## 2024-08-14 MED ORDER — 0.9 % SODIUM CHLORIDE (POUR BTL) OPTIME
TOPICAL | Status: DC | PRN
  Administered 2024-08-14: 1000 mL

## 2024-08-14 MED ORDER — ATROPINE SULFATE 0.4 MG/ML IV SOLN
INTRAVENOUS | Status: AC
  Filled 2024-08-14: qty 1

## 2024-08-14 MED ORDER — DEXAMETHASONE SODIUM PHOSPHATE 10 MG/ML IJ SOLN
INTRAMUSCULAR | Status: DC | PRN
  Administered 2024-08-14: 5 mg via INTRAVENOUS

## 2024-08-14 MED ORDER — DEXAMETHASONE SODIUM PHOSPHATE 10 MG/ML IJ SOLN
INTRAMUSCULAR | Status: AC
Start: 2024-08-14 — End: 2024-08-14
  Filled 2024-08-14: qty 1

## 2024-08-14 MED ORDER — BUPIVACAINE-EPINEPHRINE (PF) 0.25% -1:200000 IJ SOLN
INTRAMUSCULAR | Status: DC | PRN
  Administered 2024-08-14: 40 mL

## 2024-08-14 MED ORDER — EPHEDRINE 5 MG/ML INJ
INTRAVENOUS | Status: AC
  Filled 2024-08-14: qty 5

## 2024-08-14 MED ORDER — THROMBIN 20000 UNITS EX KIT
PACK | CUTANEOUS | Status: DC | PRN
  Administered 2024-08-14: 20 mL via TOPICAL

## 2024-08-14 MED ORDER — LIDOCAINE 2% (20 MG/ML) 5 ML SYRINGE
INTRAMUSCULAR | Status: AC
Start: 2024-08-14 — End: 2024-08-14
  Filled 2024-08-14: qty 5

## 2024-08-14 MED ORDER — MIDAZOLAM HCL 2 MG/2ML IJ SOLN
INTRAMUSCULAR | Status: AC
  Filled 2024-08-14: qty 2

## 2024-08-14 MED ORDER — PROPOFOL 10 MG/ML IV BOLUS
INTRAVENOUS | Status: AC
  Filled 2024-08-14: qty 20

## 2024-08-14 MED ORDER — PROPOFOL 10 MG/ML IV BOLUS
INTRAVENOUS | Status: DC | PRN
  Administered 2024-08-14: 160 mg via INTRAVENOUS

## 2024-08-14 MED ORDER — CHLORHEXIDINE GLUCONATE 0.12 % MT SOLN
OROMUCOSAL | Status: AC
  Administered 2024-08-14: 15 mL via OROMUCOSAL
  Filled 2024-08-14: qty 15

## 2024-08-14 MED ORDER — EPHEDRINE SULFATE-NACL 50-0.9 MG/10ML-% IV SOSY
PREFILLED_SYRINGE | INTRAVENOUS | Status: DC | PRN
  Administered 2024-08-14: 5 mg via INTRAVENOUS
  Administered 2024-08-14: 10 mg via INTRAVENOUS
  Administered 2024-08-14: 5 mg via INTRAVENOUS

## 2024-08-14 MED ORDER — HYDROMORPHONE HCL 1 MG/ML IJ SOLN: 1.0000 mg | Freq: Once | INTRAMUSCULAR | Status: AC

## 2024-08-14 MED ORDER — LIDOCAINE 2% (20 MG/ML) 5 ML SYRINGE
INTRAMUSCULAR | Status: DC | PRN
  Administered 2024-08-14: 100 mg via INTRAVENOUS

## 2024-08-14 MED ORDER — BUPIVACAINE-EPINEPHRINE 0.25% -1:200000 IJ SOLN
INTRAMUSCULAR | Status: DC | PRN
  Administered 2024-08-14: 9 mL

## 2024-08-14 MED ORDER — DEXMEDETOMIDINE HCL IN NACL 80 MCG/20ML IV SOLN
INTRAVENOUS | Status: AC
Start: 2024-08-14 — End: 2024-08-14
  Filled 2024-08-14: qty 20

## 2024-08-14 MED ORDER — POVIDONE-IODINE 7.5 % EX SOLN
Freq: Once | CUTANEOUS | Status: DC
  Filled 2024-08-14: qty 118

## 2024-08-14 MED ORDER — BUPIVACAINE LIPOSOME 1.3 % IJ SUSP
INTRAMUSCULAR | Status: AC
  Filled 2024-08-14: qty 20

## 2024-08-14 MED ORDER — DEXMEDETOMIDINE HCL IN NACL 80 MCG/20ML IV SOLN
INTRAVENOUS | Status: DC | PRN
  Administered 2024-08-14 (×2): 8 ug via INTRAVENOUS

## 2024-08-14 MED ORDER — SUGAMMADEX SODIUM 200 MG/2ML IV SOLN
INTRAVENOUS | Status: DC | PRN
  Administered 2024-08-14: 200 mg via INTRAVENOUS
  Administered 2024-08-14: 100 mg via INTRAVENOUS

## 2024-08-14 MED ORDER — CEFAZOLIN SODIUM-DEXTROSE 2-4 GM/100ML-% IV SOLN
2.0000 g | INTRAVENOUS | Status: AC
Start: 2024-08-14 — End: 2024-08-14
  Administered 2024-08-14: 2 g via INTRAVENOUS
  Filled 2024-08-14: qty 100

## 2024-08-14 MED ORDER — ONDANSETRON HCL 4 MG/2ML IJ SOLN: 4.0000 mg | Freq: Once | INTRAMUSCULAR | Status: DC | PRN

## 2024-08-14 MED ORDER — ONDANSETRON HCL 4 MG/2ML IJ SOLN
INTRAMUSCULAR | Status: AC
Start: 2024-08-14 — End: 2024-08-14
  Filled 2024-08-14: qty 2

## 2024-08-14 MED ORDER — ONDANSETRON HCL 4 MG/2ML IJ SOLN
INTRAMUSCULAR | Status: DC | PRN
  Administered 2024-08-14: 4 mg via INTRAVENOUS

## 2024-08-14 MED ORDER — PHENYLEPHRINE HCL-NACL 20-0.9 MG/250ML-% IV SOLN
INTRAVENOUS | Status: DC | PRN
  Administered 2024-08-14: 30 ug/min via INTRAVENOUS

## 2024-08-14 MED ORDER — OXYCODONE HCL 5 MG/5ML PO SOLN
ORAL | Status: AC
  Filled 2024-08-14: qty 5

## 2024-08-14 MED ORDER — HYDROMORPHONE HCL 1 MG/ML IJ SOLN: 0.2500 mg | INTRAMUSCULAR | Status: DC | PRN

## 2024-08-14 MED ORDER — LACTATED RINGERS IV SOLN: INTRAVENOUS | Status: DC | PRN

## 2024-08-14 MED ORDER — ORAL CARE MOUTH RINSE: 15.0000 mL | Freq: Once | OROMUCOSAL | Status: AC

## 2024-08-14 MED ORDER — FENTANYL CITRATE (PF) 250 MCG/5ML IJ SOLN
INTRAMUSCULAR | Status: DC | PRN
  Administered 2024-08-14: 200 ug via INTRAVENOUS
  Administered 2024-08-14: 50 ug via INTRAVENOUS

## 2024-08-14 MED ORDER — HYDROXYZINE HCL 25 MG PO TABS
25.0000 mg | ORAL_TABLET | Freq: Once | ORAL | Status: AC
  Administered 2024-08-14: 25 mg via ORAL
  Filled 2024-08-14: qty 1

## 2024-08-14 MED ORDER — FENTANYL CITRATE (PF) 250 MCG/5ML IJ SOLN
INTRAMUSCULAR | Status: AC
  Filled 2024-08-14: qty 5

## 2024-08-14 MED ORDER — MIDAZOLAM HCL 2 MG/2ML IJ SOLN
INTRAMUSCULAR | Status: DC | PRN
  Administered 2024-08-14: 2 mg via INTRAVENOUS

## 2024-08-14 MED ORDER — DROPERIDOL 2.5 MG/ML IJ SOLN: 0.6250 mg | Freq: Once | INTRAMUSCULAR | Status: DC | PRN

## 2024-08-14 MED ORDER — OXYCODONE HCL 5 MG/5ML PO SOLN
5.0000 mg | Freq: Once | ORAL | Status: AC | PRN
  Administered 2024-08-14: 5 mg via ORAL

## 2024-08-14 MED ORDER — HYDROMORPHONE HCL 1 MG/ML IJ SOLN
INTRAMUSCULAR | Status: AC
  Administered 2024-08-14: 1 mg via INTRAVENOUS
  Filled 2024-08-14: qty 1

## 2024-08-14 MED ORDER — BUPIVACAINE-EPINEPHRINE (PF) 0.25% -1:200000 IJ SOLN
INTRAMUSCULAR | Status: AC
  Filled 2024-08-14: qty 30

## 2024-08-14 MED ORDER — THROMBIN 20000 UNITS EX SOLR
CUTANEOUS | Status: AC
  Filled 2024-08-14: qty 20000

## 2024-08-14 MED ORDER — OXYCODONE HCL 5 MG PO TABS: 5.0000 mg | ORAL_TABLET | Freq: Once | ORAL | Status: AC | PRN

## 2024-08-14 MED ORDER — TIZANIDINE HCL 4 MG PO TABS: 4.0000 mg | ORAL_TABLET | Freq: Four times a day (QID) | ORAL | 0 refills | Status: AC | PRN

## 2024-08-14 SURGICAL SUPPLY — 76 items
BAG COUNTER SPONGE SURGICOUNT (BAG) ×1 IMPLANT
BENZOIN TINCTURE PRP APPL 2/3 (GAUZE/BANDAGES/DRESSINGS) ×1 IMPLANT
BLADE CLIPPER SURG (BLADE) IMPLANT
BUR PRECISION FLUTE 5.0 (BURR) ×1 IMPLANT
BUR PRESCISION 1.7 ELITE (BURR) ×1 IMPLANT
BUR ROUND PRECISION 4.0 (BURR) IMPLANT
BUR SABER RD CUTTING 3.0 (BURR) IMPLANT
CNTNR URN SCR LID CUP LEK RST (MISCELLANEOUS) ×1 IMPLANT
COVER MAYO STAND STRL (DRAPES) ×2 IMPLANT
COVER SURGICAL LIGHT HANDLE (MISCELLANEOUS) ×1 IMPLANT
DRAPE C-ARM 42X72 X-RAY (DRAPES) ×1 IMPLANT
DRAPE C-ARMOR (DRAPES) IMPLANT
DRAPE POUCH INSTRU U-SHP 10X18 (DRAPES) ×1 IMPLANT
DRAPE SURG 17X23 STRL (DRAPES) ×4 IMPLANT
DURAPREP 26ML APPLICATOR (WOUND CARE) ×1 IMPLANT
ELECT CAUTERY BLADE 6.4 (BLADE) ×1 IMPLANT
ELECTRODE BLDE 4.0 EZ CLN MEGD (MISCELLANEOUS) ×1 IMPLANT
ELECTRODE REM PT RTRN 9FT ADLT (ELECTROSURGICAL) ×1 IMPLANT
EVACUATOR SILICONE 100CC (DRAIN) IMPLANT
FILTER STRAW FLUID ASPIR (MISCELLANEOUS) ×1 IMPLANT
GAUZE 4X4 16PLY ~~LOC~~+RFID DBL (SPONGE) ×1 IMPLANT
GAUZE SPONGE 4X4 12PLY STRL (GAUZE/BANDAGES/DRESSINGS) ×1 IMPLANT
GLOVE BIO SURGEON STRL SZ 6.5 (GLOVE) ×1 IMPLANT
GLOVE BIO SURGEON STRL SZ8 (GLOVE) ×1 IMPLANT
GLOVE BIOGEL PI IND STRL 7.0 (GLOVE) ×1 IMPLANT
GLOVE BIOGEL PI IND STRL 8 (GLOVE) ×1 IMPLANT
GLOVE SURG ENC MOIS LTX SZ6.5 (GLOVE) ×1 IMPLANT
GOWN STRL REUS W/ TWL LRG LVL3 (GOWN DISPOSABLE) ×2 IMPLANT
GOWN STRL REUS W/ TWL XL LVL3 (GOWN DISPOSABLE) ×1 IMPLANT
GRAFT BNE MATRIX VG FRMBL SM 1 (Bone Implant) IMPLANT
GUIDEWIRE SHARP VIPER II (WIRE) IMPLANT
IV CATH 14GX2 1/4 (CATHETERS) ×1 IMPLANT
KIT ALARA NEURO ACCESS (KITS) IMPLANT
KIT BASIN OR (CUSTOM PROCEDURE TRAY) ×1 IMPLANT
KIT POSITIONER JACKSON TABLE (MISCELLANEOUS) ×1 IMPLANT
KIT TURNOVER KIT B (KITS) ×1 IMPLANT
MARKER SKIN DUAL TIP RULER LAB (MISCELLANEOUS) ×2 IMPLANT
NDL 18GX1X1/2 (RX/OR ONLY) (NEEDLE) ×1 IMPLANT
NDL 22X1.5 STRL (OR ONLY) (MISCELLANEOUS) ×2 IMPLANT
NDL HYPO 25GX1X1/2 BEV (NEEDLE) ×1 IMPLANT
NDL SPNL 18GX3.5 QUINCKE PK (NEEDLE) ×2 IMPLANT
NEEDLE 18GX1X1/2 (RX/OR ONLY) (NEEDLE) IMPLANT
NEEDLE 22X1.5 STRL (OR ONLY) (MISCELLANEOUS) ×1 IMPLANT
NEEDLE HYPO 25GX1X1/2 BEV (NEEDLE) ×1 IMPLANT
NEEDLE SPNL 18GX3.5 QUINCKE PK (NEEDLE) ×1 IMPLANT
PACK LAMINECTOMY ORTHO (CUSTOM PROCEDURE TRAY) ×1 IMPLANT
PACK UNIVERSAL I (CUSTOM PROCEDURE TRAY) ×1 IMPLANT
PAD ARMBOARD POSITIONER FOAM (MISCELLANEOUS) ×2 IMPLANT
PATTIES SURGICAL .5 X1 (DISPOSABLE) ×1 IMPLANT
PATTIES SURGICAL .5X1.5 (GAUZE/BANDAGES/DRESSINGS) ×1 IMPLANT
ROD VIPER2 5.5X45 PRE LARDOSED (Rod) IMPLANT
SCREW POLY CANN 6X45 TI (Screw) IMPLANT
SCREW POLY VIPER MIS 7X40MM (Screw) IMPLANT
SCREW SET SINGLE INNER MIS (Screw) IMPLANT
SOLN 0.9% NACL 1000 ML (IV SOLUTION) ×1 IMPLANT
SOLN 0.9% NACL POUR BTL 1000ML (IV SOLUTION) ×1 IMPLANT
SOLN STERILE WATER 1000 ML (IV SOLUTION) ×1 IMPLANT
SOLN STERILE WATER BTL 1000 ML (IV SOLUTION) ×1 IMPLANT
SPONGE INTESTINAL PEANUT (DISPOSABLE) ×1 IMPLANT
SPONGE SURGIFOAM ABS GEL 100 (HEMOSTASIS) ×1 IMPLANT
STRIP CLOSURE SKIN 1/2X4 (GAUZE/BANDAGES/DRESSINGS) ×2 IMPLANT
STRIP CLOSURE SKIN 1/4X4 (GAUZE/BANDAGES/DRESSINGS) IMPLANT
SURGIFLO W/THROMBIN 8M KIT (HEMOSTASIS) IMPLANT
SUT MNCRL AB 4-0 PS2 18 (SUTURE) ×1 IMPLANT
SUT VIC AB 0 CT1 18XCR BRD 8 (SUTURE) ×1 IMPLANT
SUT VIC AB 1 CT1 18XCR BRD 8 (SUTURE) ×1 IMPLANT
SUT VIC AB 2-0 CT2 18 VCP726D (SUTURE) ×1 IMPLANT
SYR 20ML LL LF (SYRINGE) ×2 IMPLANT
SYR BULB IRRIG 60ML STRL (SYRINGE) ×1 IMPLANT
SYR CONTROL 10ML LL (SYRINGE) ×2 IMPLANT
SYR TB 1ML LUER SLIP (SYRINGE) ×1 IMPLANT
TAP CANN VIPER2 DL 6.0 (TAP) IMPLANT
TAP EXPEDIUM DL 7X2 (INSTRUMENTS) IMPLANT
TAPE CLOTH SURG 4X10 WHT LF (GAUZE/BANDAGES/DRESSINGS) IMPLANT
TRAY FOLEY MTR SLVR 16FR STAT (SET/KITS/TRAYS/PACK) ×1 IMPLANT
YANKAUER SUCT BULB TIP NO VENT (SUCTIONS) ×1 IMPLANT

## 2024-08-14 NOTE — Op Note (Signed)
 PATIENT NAME: Natasha Cain   MEDICAL RECORD NO.:   [id number]    DATE OF BIRTH: 1976-04-21   DATE OF PROCEDURE: 08/14/2024                                OPERATIVE REPORT     PREOPERATIVE DIAGNOSES: 1.  Status post L3/4 lateral interbody fusion on 08/13/2024, requiring a posterior fusion and decompression with instrumentation   POSTOPERATIVE DIAGNOSES:   1.  Status post L3/4 lateral interbody fusion on 08/13/2024, requiring a posterior fusion and decompression with instrumentation     PROCEDURE (Stage 2): 1. Posterior spinal fusion, L3/4 2. Posterior decompression L3/4 3. Placement of posterior nonsegmental instrumentation,L3 bilaterally 4.  Removal and replacement of bilateral L4 pedicle screws 5.  Removal of bilateral L5 and S1 pedicle screws 6.  Use of morselized allograft - Vivigen 7.  Intraoperative use of floroscopy   SURGEON:  Oneil Priestly, MD   ASSISTANT:  Ileana Clara, PA-C   ANESTHESIA:  General endotracheal anesthesia.   COMPLICATIONS:  None.   DISPOSITION:  Stable.   ESTIMATED BLOOD LOSS:  Minimal   INDICATIONS FOR SURGERY: Briefly, Natasha Cain is one day status post a lateral lumbar fusion as noted above.  Please refer to my operative report dated 08/13/2024, for a full account of the patient's preoperative history and indications for surgery.  The patient did present today for stage 2 of what was to be a 2-staged procedure.   OPERATIVE DETAILS:  On 08/14/2024, the patient was brought to surgery and general endotracheal anesthesia was administered.  The patient was placed prone onto a Jackson spinal bed.  The back was then prepped and draped in the usual sterile fashion.  I then made paramedian incisions on the right and left sides.  A Wiltsie approach was utilized.  The paraspinal musculature was retracted using a self-retaining retractor on the right and left sides.  The previously placed hardware was identified bilaterally.  At this point, the caps and  interconnecting rods were removed bilaterally, as were the pedicle screws at L4, L5, and S1.  Bone wax was placed into the cannulated pedicle holes at L5 and S1.  At L4, the pedicles were tapped up to a 7 mm tap.  I then cannulated the bilateral L3 pedicles using Jamshidi's, followed by a cannulated 6 mm tap.  This was done under AP and lateral fluoroscopy.  At this point, I turned my attention toward the decompressive aspect of the procedure.  Bilateral, a high-speed bur was used, in addition to Kerrison punches, to perform a bilateral partial facetectomy, with removal of the ligamentum flavum, this did decompress the lateral recess on the right and left sides.  I then turned my attention toward the fusion portion of the procedure.  Using a high-speed bur, the bilateral L3-4 facet joints and posterolateral gutters were decorticated.  At this point division was packed into the bilateral L3-4 facet joint and posterolateral gutters.  At this point, 6 x 45 mm screws were advanced over the guidewires at L3, and 7 x 40 mm screws were placed into the cannulated pedicles at L4.  45 mm rods were secured into the tulip heads of the screws bilaterally.  Caps were placed and a final locking procedure was performed.  I was very pleased with the final AP and lateral fluoroscopic images.  The wounds were then copiously irrigated using normal saline.  The wounds were then closed  using #1 Vicryl, followed by 2-0 Vicryl, and the skin was closed using 4-0 Monocryl. Benzoin and Steri-Strips were applied followed by sterile dressing.  All instrument counts were correct at the termination of the procedure.    Of note, Ileana Clara was my assistant throughout surgery, and did aid in retraction, suctioning, placement of the hardware, and closure for both the entire procedure.    Oneil Priestly, MD

## 2024-08-14 NOTE — Anesthesia Postprocedure Evaluation (Signed)
 Anesthesia Post Note  Patient: Natasha Cain  Procedure(s) Performed: LUMBAR THREE - LUMBAR FOUR POSTERIOR DECOMPRESSION FUSION WITH INSTRUMENTATION AND ALLOGRAFT WITH REMOVAL OF INSTRUMENTATION LUMBAR FOUR - SACRUM ONE (Spine Lumbar)     Patient location during evaluation: PACU Anesthesia Type: General Level of consciousness: awake and alert Pain management: pain level controlled Vital Signs Assessment: post-procedure vital signs reviewed and stable Respiratory status: spontaneous breathing, nonlabored ventilation and respiratory function stable Cardiovascular status: blood pressure returned to baseline and stable Postop Assessment: no apparent nausea or vomiting Anesthetic complications: no   No notable events documented.  Last Vitals:  Vitals:   08/14/24 1030 08/14/24 1045  BP: 104/65 116/64  Pulse: 61 (!) 59  Resp: 12 10  Temp:    SpO2: 94% 92%    Last Pain:  Vitals:   08/14/24 1045  TempSrc:   PainSc: Asleep                 Tadd Holtmeyer A.

## 2024-08-14 NOTE — Progress Notes (Signed)
 Pre-surgery Gatorade G2 is unavailable. Pt is diabetic and Pre-surgery Ensure (stocked by pharmacy) is not indicated. Pre-surgery Gatorade G2 is stocked only by dietary, which closes at 8 pm each night. Pharmacy does not carry. This RN called the [facility] and surgical floors (5N, 6N), however they do not carry either.

## 2024-08-14 NOTE — Transfer of Care (Signed)
 Immediate Anesthesia Transfer of Care Note  Patient: Natasha Cain  Procedure(s) Performed: LUMBAR THREE - LUMBAR FOUR POSTERIOR DECOMPRESSION FUSION WITH INSTRUMENTATION AND ALLOGRAFT WITH REMOVAL OF INSTRUMENTATION LUMBAR FOUR - SACRUM ONE (Spine Lumbar)  Patient Location: PACU  Anesthesia Type:General  Level of Consciousness: awake, alert , patient cooperative, and responds to stimulation  Airway & Oxygen Therapy: Patient Spontanous Breathing and Patient connected to face mask oxygen  Post-op Assessment: Report given to RN, Post -op Vital signs reviewed and stable, and Patient moving all extremities X 4  Post vital signs: Reviewed and stable  Last Vitals:  Vitals Value Taken Time  BP 114/66 08/14/24 10:11  Temp 37.2 C 08/14/24 10:12  Pulse 62 08/14/24 10:15  Resp 11 08/14/24 10:15  SpO2 93 % 08/14/24 10:15  Vitals shown include unfiled device data.  Last Pain:  Vitals:   08/14/24 0734  TempSrc:   PainSc: Asleep      Patients Stated Pain Goal: 3 (08/13/24 2028)  Complications: No notable events documented.

## 2024-08-14 NOTE — Anesthesia Procedure Notes (Signed)
 Procedure Name: Intubation Date/Time: 08/14/2024 7:53 AM  Performed by: Jolynn Mage, CRNAPre-anesthesia Checklist: Patient identified, Patient being monitored, Timeout performed, Emergency Drugs available and Suction available Patient Re-evaluated:Patient Re-evaluated prior to induction Oxygen Delivery Method: Circle system utilized Preoxygenation: Pre-oxygenation with 100% oxygen Induction Type: IV induction Ventilation: Mask ventilation without difficulty Laryngoscope Size: Mac, 3 and Glidescope Grade View: Grade I Tube type: Oral Tube size: 7.0 mm Number of attempts: 1 Airway Equipment and Method: Rigid stylet and Video-laryngoscopy Placement Confirmation: ETT inserted through vocal cords under direct vision, positive ETCO2 and breath sounds checked- equal and bilateral Secured at: 22 cm Tube secured with: Tape Dental Injury: Teeth and Oropharynx as per pre-operative assessment

## 2024-08-14 NOTE — H&P (Signed)
 Patient tolerated day 1 of her procedure without difficulty. Presents today for stage 2. Will proceed as scheduled.

## 2024-08-15 ENCOUNTER — Encounter (HOSPITAL_COMMUNITY): Payer: Self-pay | Admitting: Orthopedic Surgery

## 2024-08-15 LAB — GLUCOSE, CAPILLARY: Glucose-Capillary: 122 mg/dL — ABNORMAL HIGH (ref 70–99)

## 2024-08-15 MED FILL — Thrombin For Soln 20000 Unit: CUTANEOUS | Qty: 1 | Status: AC

## 2024-08-15 NOTE — Progress Notes (Signed)
 Patient alert and oriented, mae's well, voiding adequate amount of urine, swallowing without difficulty, no c/o pain at time of discharge. Patient discharged home with family. Script and discharged instructions given to patient. Patient and family stated understanding of instructions given. Patient has an appointment with Dr. Beuford in 2 weeks. Patient waiting for family for ride home.

## 2024-08-15 NOTE — Evaluation (Signed)
 Physical Therapy Evaluation Patient Details Name: Natasha Cain MRN: [id number] DOB: 1976-09-03 Today's Date: 08/15/2024  History of Present Illness  Pt is a 48 y.o. female who presented 08/13/24 L3/4 lateral interbody fusion followed by posterior spinal decompression and fusion L3/4 along with removal of bil pedicle screws L4-S1 with replacement of bil L4 pedicle screws. PMH: anxiety, asthma, arthritis, COPD, depression, DM, HLD, HTN, hypothyroidism, substance abuse   Clinical Impression  Pt presents with condition above and deficits mentioned below, see PT Problem List. PTA, she was mod I using a SPC for functional mobility, living with her 83 y.o. daughter in a 1-level apartment with a level entry. Her daughter is home schooled and can assist 24/7. Currently, the pt reports the numbness/tingling in her thighs has improved a little since surgery but she has a hx of bil peripheral neuropathy impacting sensation inferior to her bil knees. She is currently performing all functional mobility without LOB at a supervision level while using a RW for support. She is requesting and could benefit from HHPT upon d/c to maximize her return to baseline. Will continue to follow acutely.        If plan is discharge home, recommend the following: A little help with bathing/dressing/bathroom;Assistance with cooking/housework;Assist for transportation   Can travel by private vehicle        Equipment Recommendations None recommended by PT  Recommendations for Other Services       Functional Status Assessment Patient has had a recent decline in their functional status and demonstrates the ability to make significant improvements in function in a reasonable and predictable amount of time.     Precautions / Restrictions Precautions Precautions: Fall;Back Precaution Booklet Issued: Yes (comment) Recall of Precautions/Restrictions: Intact Precaution/Restrictions Comments: reviewed precautions Required Braces  or Orthoses: Spinal Brace Spinal Brace: Thoracolumbosacral orthotic;Applied in sitting position Restrictions Weight Bearing Restrictions Per Provider Order: No      Mobility  Bed Mobility               General bed mobility comments: Pt sitting in recliner upon arrival and at end of session.    Transfers Overall transfer level: Needs assistance Equipment used: Rolling walker (2 wheels) Transfers: Sit to/from Stand Sit to Stand: Supervision           General transfer comment: Pt able to stand from chair with supervision for safety, no LOB    Ambulation/Gait Ambulation/Gait assistance: Supervision Gait Distance (Feet): 200 Feet Assistive device: Rolling walker (2 wheels) Gait Pattern/deviations: Step-through pattern, Decreased stride length, Trunk flexed Gait velocity: reduced Gait velocity interpretation: 1.31 - 2.62 ft/sec, indicative of limited community ambulator   General Gait Details: Pt needed VCs to remain proximal to RW to improve her upright posture as distance progressed. x1 standing rest break. No LOB, supervision for safety  Stairs            Wheelchair Mobility     Tilt Bed    Modified Rankin (Stroke Patients Only)       Balance Overall balance assessment: Needs assistance Sitting-balance support: No upper extremity supported, Feet supported Sitting balance-Leahy Scale: Good     Standing balance support: Bilateral upper extremity supported, During functional activity, No upper extremity supported Standing balance-Leahy Scale: Fair Standing balance comment: Able to stand statically without UE support but pt benefits from RW to ambulate  Pertinent Vitals/Pain Pain Assessment Pain Assessment: Faces Faces Pain Scale: Hurts even more Pain Location: back Pain Descriptors / Indicators: Discomfort, Grimacing, Guarding, Operative site guarding Pain Intervention(s): Monitored during session, Limited  activity within patient's tolerance, Repositioned    Home Living Family/patient expects to be discharged to:: Private residence Living Arrangements: Children (67 y.o. daughter, home schooled) Available Help at Discharge: Family;Available 24 hours/day Type of Home: Apartment Home Access: Level entry       Home Layout: One level Home Equipment: Agricultural consultant (2 wheels);Cane - quad;Cane - single point;BSC/3in1;Shower seat      Prior Function Prior Level of Function : Independent/Modified Independent             Mobility Comments: mod I using SPC       Extremity/Trunk Assessment   Upper Extremity Assessment Upper Extremity Assessment: Defer to OT evaluation    Lower Extremity Assessment Lower Extremity Assessment: RLE deficits/detail;LLE deficits/detail RLE Deficits / Details: hx of bil peripheral neuropathy resulting in no sensation inferior to knee; reports hx of some numbness/tingling proximally at thigh, better on L than R, but reports slightly improved since surgery; MMT scores of >/= 4+/4 bil RLE Sensation: decreased light touch;history of peripheral neuropathy LLE Deficits / Details: hx of bil peripheral neuropathy resulting in slight sensation in lower leg and no sensation at foot; reports hx of some numbness/tingling proximally at thigh, better on L than R, but reports slightly improved since surgery; MMT scores of >/= 4+/4 bil LLE Sensation: decreased light touch;history of peripheral neuropathy    Cervical / Trunk Assessment Cervical / Trunk Assessment: Back Surgery  Communication   Communication Communication: No apparent difficulties    Cognition Arousal: Alert Behavior During Therapy: WFL for tasks assessed/performed   PT - Cognitive impairments: No apparent impairments                         Following commands: Intact       Cueing Cueing Techniques: Verbal cues, Visual cues     General Comments General comments (skin integrity, edema,  etc.): Educated pt to ambulate >/= 3x/day and educated pt on car transfers. Pt verbalized understanding    Exercises     Assessment/Plan    PT Assessment Patient needs continued PT services  PT Problem List Decreased strength;Decreased activity tolerance;Decreased balance;Decreased mobility;Impaired sensation;Pain       PT Treatment Interventions DME instruction;Gait training;Functional mobility training;Therapeutic activities;Therapeutic exercise;Balance training;Neuromuscular re-education;Patient/family education    PT Goals (Current goals can be found in the Care Plan section)  Acute Rehab PT Goals Patient Stated Goal: to get HHPT PT Goal Formulation: With patient Time For Goal Achievement: 08/21/24 Potential to Achieve Goals: Good    Frequency Min 5X/week     Co-evaluation               AM-PAC PT 6 Clicks Mobility  Outcome Measure Help needed turning from your back to your side while in a flat bed without using bedrails?: A Little Help needed moving from lying on your back to sitting on the side of a flat bed without using bedrails?: A Little Help needed moving to and from a bed to a chair (including a wheelchair)?: A Little Help needed standing up from a chair using your arms (e.g., wheelchair or bedside chair)?: A Little Help needed to walk in hospital room?: A Little Help needed climbing 3-5 steps with a railing? : A Little 6 Click Score: 18    End  of Session Equipment Utilized During Treatment: Back brace Activity Tolerance: Patient tolerated treatment well Patient left: with call bell/phone within reach;Other (comment) (standing in room anterior to chair with MD) Nurse Communication: Mobility status PT Visit Diagnosis: Unsteadiness on feet (R26.81);Other abnormalities of gait and mobility (R26.89);Muscle weakness (generalized) (M62.81);Difficulty in walking, not elsewhere classified (R26.2);Pain Pain - Right/Left:  (back) Pain - part of body:  (back)     Time: 9249-9183 PT Time Calculation (min) (ACUTE ONLY): 26 min   Charges:   PT Evaluation $PT Eval Low Complexity: 1 Low PT Treatments $Therapeutic Activity: 8-22 mins PT General Charges $$ ACUTE PT VISIT: 1 Visit         Theo Ferretti, PT, DPT Acute Rehabilitation Services  Office: [phone]   Theo CHRISTELLA Ferretti 08/15/2024, 9:08 AM

## 2024-08-15 NOTE — Plan of Care (Signed)

## 2024-08-15 NOTE — Progress Notes (Signed)
    Patient doing well  Patient comfortable Denies leg pain   Physical Exam: Vitals:   08/15/24 0434 08/15/24 0719  BP: 113/65 103/74  Pulse: 69 61  Resp: 18 20  Temp: 98.9 F (37.2 C) 98.4 F (36.9 C)  SpO2: 100% 97%    Dressing in place NVI  POD #1 s/p L3/4 lateral/posterior decompression/fusion, doing well  - up with PT/OT, encourage ambulation - Methodone for pain, tizanidine  for muscle spasms - d/c home today with f/u in 2 weeks

## 2024-08-15 NOTE — Evaluation (Signed)
 Occupational Therapy Evaluation Patient Details Name: Natasha Cain MRN: [id number] DOB: [birth year] Today's Date: 08/15/2024   History of Present Illness   Pt is a 48 y.o. female who presented 08/13/24 L3/4 lateral interbody fusion followed by posterior spinal decompression and fusion L3/4 along with removal of bil pedicle screws L4-S1 with replacement of bil L4 pedicle screws. PMH: anxiety, asthma, arthritis, COPD, depression, DM, HLD, HTN, hypothyroidism, substance abuse     Clinical Impressions Patient admitted for the diagnosis above.  PTA she lives at home and will have any needed assist from her grown children.  Patient Mod I for ADL and Mod I for in room mobility/toileting at [facility] level.  No further OT needs in the acute setting and no post acute OT indicated.  Follow up with MD as prescribed.       If plan is discharge home, recommend the following:   Assist for transportation     Functional Status Assessment   Patient has not had a recent decline in their functional status     Equipment Recommendations   None recommended by OT     Recommendations for Other Services         Precautions/Restrictions   Precautions Precautions: Fall;Back Precaution Booklet Issued: Yes (comment) Recall of Precautions/Restrictions: Intact Required Braces or Orthoses: Spinal Brace Spinal Brace: Thoracolumbosacral orthotic Restrictions Weight Bearing Restrictions Per Provider Order: No     Mobility Bed Mobility               General bed mobility comments: Pt sitting in recliner upon arrival and at end of session.    Transfers Overall transfer level: Modified independent Equipment used: Rolling walker (2 wheels)                      Balance Overall balance assessment: Mild deficits observed, not formally tested                                         ADL either performed or assessed with clinical judgement   ADL Overall ADL's : Modified  independent                                             Vision Baseline Vision/History: 1 Wears glasses Patient Visual Report: No change from baseline       Perception Perception: Not tested       Praxis Praxis: Not tested       Pertinent Vitals/Pain Pain Assessment Faces Pain Scale: Hurts little more Pain Location: back Pain Descriptors / Indicators: Discomfort, Grimacing, Guarding, Operative site guarding Pain Intervention(s): Monitored during session     Extremity/Trunk Assessment Upper Extremity Assessment Upper Extremity Assessment: Overall WFL for tasks assessed   Lower Extremity Assessment Lower Extremity Assessment: Defer to PT evaluation RLE Deficits / Details: hx of bil peripheral neuropathy resulting in no sensation inferior to knee; reports hx of some numbness/tingling proximally at thigh, better on L than R, but reports slightly improved since surgery; MMT scores of >/= 4+/4 bil RLE Sensation: decreased light touch;history of peripheral neuropathy LLE Deficits / Details: hx of bil peripheral neuropathy resulting in slight sensation in lower leg and no sensation at foot; reports hx of some numbness/tingling proximally at thigh, better on L than R, but  reports slightly improved since surgery; MMT scores of >/= 4+/4 bil LLE Sensation: decreased light touch;history of peripheral neuropathy   Cervical / Trunk Assessment Cervical / Trunk Assessment: Back Surgery   Communication Communication Communication: No apparent difficulties   Cognition Arousal: Alert Behavior During Therapy: WFL for tasks assessed/performed Cognition: No apparent impairments                               Following commands: Intact       Cueing  General Comments   Cueing Techniques: Verbal cues  Educated pt to ambulate >/= 3x/day and educated pt on car transfers. Pt verbalized understanding   Exercises     Shoulder Instructions      Home Living  Family/patient expects to be discharged to:: Private residence Living Arrangements: Children Available Help at Discharge: Family;Available 24 hours/day Type of Home: Apartment Home Access: Level entry     Home Layout: One level     Bathroom Shower/Tub: Chief Strategy Officer: Handicapped height     Home Equipment: Agricultural consultant (2 wheels);Cane - quad;Cane - single point;BSC/3in1;Shower seat          Prior Functioning/Environment Prior Level of Function : Independent/Modified Independent             Mobility Comments: mod I using SPC      OT Problem List: Pain   OT Treatment/Interventions:        OT Goals(Current goals can be found in the care plan section)   Acute Rehab OT Goals Patient Stated Goal: Return home OT Goal Formulation: With patient Time For Goal Achievement: 08/18/24 Potential to Achieve Goals: Good   OT Frequency:       Co-evaluation              AM-PAC OT 6 Clicks Daily Activity     Outcome Measure Help from another person eating meals?: None Help from another person taking care of personal grooming?: None Help from another person toileting, which includes using toliet, bedpan, or urinal?: None Help from another person bathing (including washing, rinsing, drying)?: None Help from another person to put on and taking off regular upper body clothing?: None Help from another person to put on and taking off regular lower body clothing?: A Little 6 Click Score: 23   End of Session Equipment Utilized During Treatment: Rolling walker (2 wheels);Back brace Nurse Communication: Mobility status  Activity Tolerance: Patient tolerated treatment well Patient left: in chair;with call bell/phone within reach  OT Visit Diagnosis: Pain                Time: 9149-9092 OT Time Calculation (min): 17 min Charges:  OT General Charges $OT Visit: 1 Visit OT Evaluation $OT Eval Moderate Complexity: 1 Mod  08/15/2024  RP,  OTR/L  Acute Rehabilitation Services  Office:  [phone]   Natasha Cain 08/15/2024, 9:10 AM

## 2024-08-31 ENCOUNTER — Emergency Department (HOSPITAL_COMMUNITY)
Admission: EM | Admit: 2024-08-31 | Discharge: 2024-08-31 | Disposition: A | Discharge status: Home / Self Care | Source: Ambulatory Visit | Attending: Emergency Medicine | Admitting: Emergency Medicine

## 2024-08-31 ENCOUNTER — Other Ambulatory Visit: Payer: Self-pay

## 2024-08-31 ENCOUNTER — Encounter (HOSPITAL_COMMUNITY): Payer: Self-pay | Admitting: *Deleted

## 2024-08-31 DIAGNOSIS — M545 Low back pain, unspecified: Secondary | ICD-10-CM | POA: Diagnosis present

## 2024-08-31 DIAGNOSIS — Z91040 Latex allergy status: Secondary | ICD-10-CM | POA: Insufficient documentation

## 2024-08-31 DIAGNOSIS — G8929 Other chronic pain: Secondary | ICD-10-CM | POA: Insufficient documentation

## 2024-08-31 DIAGNOSIS — Z7982 Long term (current) use of aspirin: Secondary | ICD-10-CM | POA: Diagnosis not present

## 2024-08-31 MED ORDER — HYDROMORPHONE HCL 2 MG/ML IJ SOLN: 2.0000 mg | Freq: Once | INTRAMUSCULAR | Status: DC

## 2024-08-31 MED ORDER — HYDROMORPHONE HCL 1 MG/ML IJ SOLN
2.0000 mg | Freq: Once | INTRAMUSCULAR | Status: AC
  Administered 2024-08-31: 2 mg via INTRAMUSCULAR
  Filled 2024-08-31: qty 2

## 2024-08-31 NOTE — ED Notes (Signed)
EDPA Provider at bedside. 

## 2024-08-31 NOTE — Discharge Instructions (Addendum)
 Call [facility] tomorrow to discuss methadone  mangement.

## 2024-08-31 NOTE — ED Provider Notes (Signed)
 [place] EMERGENCY DEPARTMENT AT [facility] Provider Note   CSN: [id number] Arrival date & time: 08/31/24  1506     Patient presents with: Back Pain   Natasha Cain is a 48 y.o. female.   Pt complains of back pain.  Pt reports she vomited up her methadone  dosage today.  Pt reports she has a prescriptions for oxycodone  but this does not relieve her pain.  Pt had back surgery 2 weeks ago.  Pt requesting a dosage of methadone .   The history is provided by the patient. No language interpreter was used.  Back Pain Location:  Lumbar spine Quality:  Aching Radiates to:  Does not radiate Pain severity:  Moderate Onset quality:  Gradual Duration:  1 day Timing:  Constant Progression:  Worsening Chronicity:  New Worsened by:  Nothing Ineffective treatments:  None tried      Prior to Admission medications   Medication Sig Start Date End Date Taking? Authorizing Provider  acetaminophen  (TYLENOL ) 500 MG tablet Take 2 tablets (1,000 mg total) by mouth every 6 (six) hours as needed for mild pain or moderate pain. 11/14/22   Gawne, Meghan M, PA-C  albuterol  (VENTOLIN  HFA) 108 (90 Base) MCG/ACT inhaler Inhale 2 puffs into the lungs every 6 (six) hours as needed for wheezing or shortness of breath.    [provider]  allopurinol  (ZYLOPRIM ) 100 MG tablet Take 100 mg by mouth in the morning. 04/24/24   [provider]  amphetamine -dextroamphetamine  (ADDERALL) 30 MG tablet Take 30 mg by mouth 2 (two) times daily. 10/25/21   [provider]  ARIPiprazole  (ABILIFY ) 5 MG tablet Take 5 mg by mouth in the morning.    [provider]  aspirin  EC 81 MG tablet Take 81 mg by mouth in the morning. Swallow whole.    [provider]  atenolol  (TENORMIN ) 50 MG tablet Take 100 mg by mouth in the morning. 03/20/22   [provider]  buPROPion  (WELLBUTRIN  SR) 150 MG 12 hr tablet Take 150 mg by mouth in the morning.    [provider]   busPIRone  (BUSPAR ) 30 MG tablet Take 15-30 mg by mouth 2 (two) times daily as needed (anxiety).    [provider]  cephALEXin  (KEFLEX ) 500 MG capsule Take 1 capsule (500 mg total) by mouth 4 (four) times daily. 01/06/24   Marlo Asberry SAILOR, MD  fenofibrate  (TRICOR ) 145 MG tablet Take 145 mg by mouth every evening.    [provider]  fluticasone -salmeterol (ADVAIR) 100-50 MCG/ACT AEPB Inhale 1 puff into the lungs daily.    [provider]  furosemide  (LASIX ) 20 MG tablet Take 40 mg by mouth 2 (two) times daily.    [provider]  gabapentin  (NEURONTIN ) 300 MG capsule Take 300 mg by mouth 3 (three) times daily. 11/30/20   [provider]  hydrOXYzine  (VISTARIL ) 50 MG capsule Take 50-100 mg by mouth at bedtime. 10/25/21   [provider]  insulin  lispro (HUMALOG ) 100 UNIT/ML KwikPen Inject 5 Units into the skin daily as needed (elevated blood sugar).    [provider]  levothyroxine  (SYNTHROID ) 125 MCG tablet Take 125 mcg by mouth daily before breakfast. 10/21/20   [provider]  lidocaine  (LIDODERM ) 5 % Place 1 patch onto the skin daily. Remove & Discard patch within 12 hours or as directed by MD Patient taking differently: Place 1 patch onto the skin daily as needed (pain.). Remove & Discard patch within 12 hours or as directed  by MD 07/08/22   Theotis Cameron HERO, PA-C  METHADONE  HCL PO Take 175 mg by mouth daily.    [provider]  Multiple Vitamins-Minerals (MULTIVITAMIN WITH MINERALS) tablet Take 1 tablet by mouth daily.    [provider]  Omega-3 Fatty Acids (FISH OIL) 1000 MG CAPS Take 3 capsules by mouth at bedtime.    [provider]  omeprazole (PRILOSEC) 40 MG capsule Take 80 mg by mouth every morning.    [provider]  oxyCODONE -acetaminophen  (PERCOCET) 7.5-325 MG tablet Take 1 tablet by mouth in the morning, at noon, in the evening, and at bedtime.    [provider]   potassium chloride  SA (KLOR-CON  M) 20 MEQ tablet Take 1 tablet (20 mEq total) by mouth 2 (two) times daily. Patient taking differently: Take 20 mEq by mouth daily. 01/06/24   Marlo Asberry SAILOR, MD  rOPINIRole  (REQUIP ) 1 MG tablet Take 1 mg by mouth at bedtime.    [provider]  sertraline  (ZOLOFT ) 100 MG tablet Take 200 mg by mouth in the morning. 05/31/16   [provider]  spironolactone  (ALDACTONE ) 25 MG tablet Take 25 mg by mouth in the morning. 04/18/24   [provider]  tiZANidine  (ZANAFLEX ) 4 MG tablet Take 4 mg by mouth every 8 (eight) hours as needed for muscle spasms.    [provider]  tiZANidine  (ZANAFLEX ) 4 MG tablet Take 1 tablet (4 mg total) by mouth every 6 (six) hours as needed for muscle spasms. 08/14/24   Beuford Anes, MD  topiramate  (TOPAMAX ) 100 MG tablet Take 100 mg by mouth at bedtime. 10/29/20   [provider]  zinc  gluconate 50 MG tablet Take 50 mg by mouth in the morning.    [provider]    Allergies: Cymbalta [duloxetine hcl], Latex, Zorvolex [diclofenac], Ambien  [zolpidem  tartrate], Benadryl [diphenhydramine], Codeine, Darvon [propoxyphene], Mobic  [meloxicam ], and Sulfa antibiotics    Review of Systems  Musculoskeletal:  Positive for back pain.  All other systems reviewed and are negative.   Updated Vital Signs BP 124/72   Pulse 64   Temp (!) 97.2 F (36.2 C) (Temporal)   Resp 16   Ht 5' 6 (1.676 m)   Wt 98.9 kg   LMP  (LMP Unknown)   SpO2 96%   BMI 35.19 kg/m   Physical Exam Vitals reviewed.  Constitutional:      Appearance: Normal appearance.  Cardiovascular:     Rate and Rhythm: Normal rate.  Pulmonary:     Effort: Pulmonary effort is normal.  Musculoskeletal:        General: Normal range of motion.  Skin:    General: Skin is warm.  Neurological:     General: No focal deficit present.     Mental Status: She is alert.     (all labs ordered are listed, but only abnormal results  are displayed) Labs Reviewed - No data to display  EKG: None  Radiology: No results found.   Procedures   Medications Ordered in the ED  HYDROmorphone  (DILAUDID ) injection 2 mg (2 mg Intramuscular Given 08/31/24 1642)                                    Medical Decision Making Pt complains of severe pain after back surgery. Pt vomited up methadone .   Risk Parenteral controlled substances. Risk Details: PMP aware reviewed, Pt has rx for oxycodone .  I discussed pt with Dr. Cleotilde.  Methadone  not available at this facility.  Pt given injection of dilaudid  for pain.  Pt advised to take her oxycodone .  Contact [facility] tomorrow to discuss methadone  management         Final diagnoses:  Other chronic pain    ED Discharge Orders     None      An After Visit Summary was printed and given to the patient.     Flint Sonny POUR, [place] 08/31/24 1743    Cleotilde Rogue, MD 08/31/24 [phone]

## 2024-08-31 NOTE — ED Triage Notes (Signed)
 Pt ran out of her methadone , took yesterday's dose and today's dose and threw it back up per pt. Pt with recent back pain.

## 2024-09-04 NOTE — Discharge Summary (Signed)
 Patient ID: Natasha Cain MRN: [id number] DOB/AGE: 1976-06-21 48 y.o.  Admit date: 08/13/2024 Discharge date: 08/15/2024  Admission Diagnoses:  Principal Problem:   Radiculopathy   Discharge Diagnoses:  Same  Past Medical History:  Diagnosis Date   Anxiety    Arthritis    Asthma    Complication of anesthesia    patietn wakes up in panic mode   COPD (chronic obstructive pulmonary disease) (HCC)    Depression    Diabetes (HCC)    Hyperlipidemia    Hypertension    Hypothyroidism    PONV (postoperative nausea and vomiting)    Substance abuse (HCC)    Thyroid  disease     Surgeries: Procedure(s): LUMBAR THREE - LUMBAR FOUR POSTERIOR DECOMPRESSION FUSION WITH INSTRUMENTATION AND ALLOGRAFT WITH REMOVAL OF INSTRUMENTATION LUMBAR FOUR - SACRUM ONE on 08/14/2024   Consultants: none  Discharged Condition: Improved  Hospital Course: Natasha Cain is an 48 y.o. female who was admitted 08/13/2024 for operative treatment of Radiculopathy. Patient has severe unremitting pain that affects sleep, daily activities, and work/hobbies. After pre-op clearance the patient was taken to the operating room on 08/14/2024 and underwent  Procedure(s): LUMBAR THREE - LUMBAR FOUR POSTERIOR DECOMPRESSION FUSION WITH INSTRUMENTATION AND ALLOGRAFT WITH REMOVAL OF INSTRUMENTATION LUMBAR FOUR - SACRUM ONE.    Patient was given perioperative antibiotics:  Anti-infectives (From admission, onward)    Start     Dose/Rate Route Frequency Ordered Stop   08/14/24 0600  ceFAZolin  (ANCEF ) IVPB 2g/100 mL premix        2 g 200 mL/hr over 30 Minutes Intravenous On call to O.R. 08/14/24 0116 08/14/24 0830   08/13/24 1600  ceFAZolin  (ANCEF ) IVPB 2g/100 mL premix        2 g 200 mL/hr over 30 Minutes Intravenous Every 8 hours 08/13/24 1230 08/14/24 0017   08/13/24 0630  ceFAZolin  (ANCEF ) IVPB 2g/100 mL premix        2 g 200 mL/hr over 30 Minutes Intravenous On call to O.R. 08/13/24 0616 08/13/24 0830         Patient was given sequential compression devices, early ambulation to prevent DVT.  Patient benefited maximally from hospital stay and there were no complications.    Recent vital signs: BP 103/74 (BP Location: Left Arm)   Pulse 61   Temp 98.4 F (36.9 C) (Oral)   Resp 20   Ht 5' 6 (1.676 m)   Wt 101.2 kg   LMP  (LMP Unknown)   SpO2 97%   BMI 35.99 kg/m    Discharge Medications:   Allergies as of 08/15/2024       Reactions   Cymbalta [duloxetine Hcl] Anaphylaxis   Latex Rash   Zorvolex [diclofenac] Swelling   Stroke like symptoms including tingling in arms and tongue in addition to swelling   Ambien  [zolpidem  Tartrate] Other (See Comments)   Hallucinations   Benadryl [diphenhydramine] Other (See Comments)   RESTLESS LEG INCREASED   Codeine Rash   Darvon [propoxyphene] Rash   Darvocet   Mobic  [meloxicam ] Other (See Comments)   Numbness in arms   Sulfa Antibiotics Hives, Rash        Medication List     TAKE these medications    acetaminophen  500 MG tablet Commonly known as: TYLENOL  Take 2 tablets (1,000 mg total) by mouth every 6 (six) hours as needed for mild pain or moderate pain.   albuterol  108 (90 Base) MCG/ACT inhaler Commonly known as: VENTOLIN  HFA Inhale 2 puffs into the  lungs every 6 (six) hours as needed for wheezing or shortness of breath.   allopurinol  100 MG tablet Commonly known as: ZYLOPRIM  Take 100 mg by mouth in the morning.   amphetamine -dextroamphetamine  30 MG tablet Commonly known as: ADDERALL Take 30 mg by mouth 2 (two) times daily.   ARIPiprazole  5 MG tablet Commonly known as: ABILIFY  Take 5 mg by mouth in the morning.   aspirin  EC 81 MG tablet Take 81 mg by mouth in the morning. Swallow whole.   atenolol  50 MG tablet Commonly known as: TENORMIN  Take 100 mg by mouth in the morning.   buPROPion  150 MG 12 hr tablet Commonly known as: WELLBUTRIN  SR Take 150 mg by mouth in the morning.   busPIRone  30 MG tablet Commonly  known as: BUSPAR  Take 15-30 mg by mouth 2 (two) times daily as needed (anxiety).   cephALEXin  500 MG capsule Commonly known as: KEFLEX  Take 1 capsule (500 mg total) by mouth 4 (four) times daily.   fenofibrate  145 MG tablet Commonly known as: TRICOR  Take 145 mg by mouth every evening.   Fish Oil 1000 MG Caps Take 3 capsules by mouth at bedtime.   fluticasone -salmeterol 100-50 MCG/ACT Aepb Commonly known as: ADVAIR Inhale 1 puff into the lungs daily.   furosemide  20 MG tablet Commonly known as: LASIX  Take 40 mg by mouth 2 (two) times daily.   gabapentin  300 MG capsule Commonly known as: NEURONTIN  Take 300 mg by mouth 3 (three) times daily.   hydrOXYzine  50 MG capsule Commonly known as: VISTARIL  Take 50-100 mg by mouth at bedtime.   insulin  lispro 100 UNIT/ML KwikPen Commonly known as: HUMALOG  Inject 5 Units into the skin daily as needed (elevated blood sugar).   levothyroxine  125 MCG tablet Commonly known as: SYNTHROID  Take 125 mcg by mouth daily before breakfast.   lidocaine  5 % Commonly known as: Lidoderm  Place 1 patch onto the skin daily. Remove & Discard patch within 12 hours or as directed by MD What changed:  when to take this reasons to take this   METHADONE  HCL PO Take 175 mg by mouth daily.   multivitamin with minerals tablet Take 1 tablet by mouth daily.   omeprazole 40 MG capsule Commonly known as: PRILOSEC Take 80 mg by mouth every morning.   oxyCODONE -acetaminophen  7.5-325 MG tablet Commonly known as: PERCOCET Take 1 tablet by mouth in the morning, at noon, in the evening, and at bedtime.   potassium chloride  SA 20 MEQ tablet Commonly known as: KLOR-CON  M Take 1 tablet (20 mEq total) by mouth 2 (two) times daily. What changed: when to take this   rOPINIRole  1 MG tablet Commonly known as: REQUIP  Take 1 mg by mouth at bedtime.   sertraline  100 MG tablet Commonly known as: ZOLOFT  Take 200 mg by mouth in the morning.   spironolactone  25  MG tablet Commonly known as: ALDACTONE  Take 25 mg by mouth in the morning.   tiZANidine  4 MG tablet Commonly known as: ZANAFLEX  Take 4 mg by mouth every 8 (eight) hours as needed for muscle spasms. What changed: Another medication with the same name was added. Make sure you understand how and when to take each.   tiZANidine  4 MG tablet Commonly known as: ZANAFLEX  Take 1 tablet (4 mg total) by mouth every 6 (six) hours as needed for muscle spasms. What changed: You were already taking a medication with the same name, and this prescription was added. Make sure you understand how and when to take  each.   topiramate  100 MG tablet Commonly known as: TOPAMAX  Take 100 mg by mouth at bedtime.   zinc  gluconate 50 MG tablet Take 50 mg by mouth in the morning.        Diagnostic Studies: DG Lumbar Spine 2-3 Views Result Date: 08/14/2024 CLINICAL DATA:  Elective surgery. EXAM: LUMBAR SPINE - 2-3 VIEW COMPARISON:  Preoperative imaging FINDINGS: Two fluoroscopic spot views of the lumbar spine submitted from the operating room. Interbody spacers at L4-L5 and L5-S1, with removal of previous posterior rod and screw fixation. There is new posterior and lateral screw fixation at the L3-L4 level with interbody spacer. Fluoroscopy time 1 minutes 41 seconds. Dose 106.69 mGy. IMPRESSION: Intraoperative fluoroscopy during lumbar spine surgery. Electronically Signed   By: Andrea Gasman M.D.   On: 08/14/2024 10:54   DG C-Arm 1-60 Min-No Report Result Date: 08/14/2024 Fluoroscopy was utilized by the requesting physician.  No radiographic interpretation.   DG C-Arm 1-60 Min-No Report Result Date: 08/14/2024 Fluoroscopy was utilized by the requesting physician.  No radiographic interpretation.   DG Lumbar Spine 2-3 Views Result Date: 08/13/2024 CLINICAL DATA:  Elective surgery. EXAM: LUMBAR SPINE - 2-3 VIEW COMPARISON:  Lumbar MRI 10/26/2023 FINDINGS: Two fluoroscopic spot views of the lumbar spine submitted  from the operating room. Previous lower lumbar fusion hardware is partially included in the field of view. New lateral fusion with interbody spacer at L3-L4. fluoroscopy time 2 minutes 48 seconds. Dose 158.57 mGy. IMPRESSION: Intraoperative fluoroscopy during lumbar fusion. Electronically Signed   By: Andrea Gasman M.D.   On: 08/13/2024 12:48   DG C-Arm 1-60 Min-No Report Result Date: 08/13/2024 Fluoroscopy was utilized by the requesting physician.  No radiographic interpretation.   DG C-Arm 1-60 Min-No Report Result Date: 08/13/2024 Fluoroscopy was utilized by the requesting physician.  No radiographic interpretation.    Disposition: Discharge disposition: 01-Home or Self Care        POD #1 s/p L3/4 lateral/posterior decompression/fusion, doing well   - up with PT/OT, encourage ambulation - Methodone for pain, tizanidine  for muscle spasms -Scripts for pain sent to pharmacy electronically  -D/C instructions sheet printed and in chart -D/C today  -F/U in office 2 weeks   *This pt is in a methadone  clinic at [facility]. She is not to be utilizing any other narcotics outside o Methadone , they will not work. She is on a maintenance dose of 185mg  daily. Her MD who prescribes this recommends dividing the dose into 45mg  Q6hrs for better pain control with increasing her tizanidine  to q 6hrs. No additional narocotics/IV breakthrough meds recommended per Dr Mennie at [place]. She was on percocet through pain management, but this is to be discontinued as it will not function with her high maintenance dose of methadone .    Ileana Clara, PA-C  Signed: Ileana PARAS Yarixa Lightcap 09/04/2024, 10:06 AM

## 2024-09-10 ENCOUNTER — Ambulatory Visit (HOSPITAL_COMMUNITY): Admission: RE | Admit: 2024-09-10 | Source: Ambulatory Visit

## 2024-09-10 ENCOUNTER — Ambulatory Visit: Attending: Internal Medicine

## 2024-10-13 ENCOUNTER — Emergency Department (HOSPITAL_COMMUNITY)

## 2024-10-13 ENCOUNTER — Other Ambulatory Visit: Payer: Self-pay

## 2024-10-13 ENCOUNTER — Encounter (HOSPITAL_COMMUNITY): Payer: Self-pay

## 2024-10-13 ENCOUNTER — Emergency Department (HOSPITAL_COMMUNITY)
Admission: EM | Admit: 2024-10-13 | Discharge: 2024-10-13 | Disposition: A | Discharge status: Home / Self Care | Attending: Emergency Medicine | Admitting: Emergency Medicine

## 2024-10-13 DIAGNOSIS — L03116 Cellulitis of left lower limb: Secondary | ICD-10-CM | POA: Insufficient documentation

## 2024-10-13 DIAGNOSIS — Z794 Long term (current) use of insulin: Secondary | ICD-10-CM | POA: Insufficient documentation

## 2024-10-13 DIAGNOSIS — J449 Chronic obstructive pulmonary disease, unspecified: Secondary | ICD-10-CM | POA: Insufficient documentation

## 2024-10-13 DIAGNOSIS — Z91040 Latex allergy status: Secondary | ICD-10-CM | POA: Diagnosis not present

## 2024-10-13 DIAGNOSIS — W268XXA Contact with other sharp object(s), not elsewhere classified, initial encounter: Secondary | ICD-10-CM | POA: Insufficient documentation

## 2024-10-13 DIAGNOSIS — S81802A Unspecified open wound, left lower leg, initial encounter: Secondary | ICD-10-CM | POA: Diagnosis not present

## 2024-10-13 DIAGNOSIS — S8992XA Unspecified injury of left lower leg, initial encounter: Secondary | ICD-10-CM | POA: Diagnosis present

## 2024-10-13 DIAGNOSIS — Z7982 Long term (current) use of aspirin: Secondary | ICD-10-CM | POA: Insufficient documentation

## 2024-10-13 DIAGNOSIS — E876 Hypokalemia: Secondary | ICD-10-CM | POA: Diagnosis not present

## 2024-10-13 DIAGNOSIS — E119 Type 2 diabetes mellitus without complications: Secondary | ICD-10-CM | POA: Insufficient documentation

## 2024-10-13 LAB — CBC WITH DIFFERENTIAL/PLATELET
Abs Immature Granulocytes: 0.05 K/uL (ref 0.00–0.07)
Basophils Absolute: 0 K/uL (ref 0.0–0.1)
Basophils Relative: 0 %
Eosinophils Absolute: 0.1 K/uL (ref 0.0–0.5)
Eosinophils Relative: 1 %
HCT: 40.7 % (ref 36.0–46.0)
Hemoglobin: 13.7 g/dL (ref 12.0–15.0)
Immature Granulocytes: 1 %
Lymphocytes Relative: 31 %
Lymphs Abs: 3.1 K/uL (ref 0.7–4.0)
MCH: 30.7 pg (ref 26.0–34.0)
MCHC: 33.7 g/dL (ref 30.0–36.0)
MCV: 91.3 fL (ref 80.0–100.0)
Monocytes Absolute: 0.6 K/uL (ref 0.1–1.0)
Monocytes Relative: 7 %
Neutro Abs: 6 K/uL (ref 1.7–7.7)
Neutrophils Relative %: 60 %
Platelets: 369 K/uL (ref 150–400)
RBC: 4.46 MIL/uL (ref 3.87–5.11)
RDW: 13.3 % (ref 11.5–15.5)
WBC: 9.9 K/uL (ref 4.0–10.5)
nRBC: 0 % (ref 0.0–0.2)

## 2024-10-13 LAB — URINALYSIS, W/ REFLEX TO CULTURE (INFECTION SUSPECTED)
Bilirubin Urine: NEGATIVE
Glucose, UA: NEGATIVE mg/dL
Hgb urine dipstick: NEGATIVE
Ketones, ur: NEGATIVE mg/dL
Leukocytes,Ua: NEGATIVE
Nitrite: NEGATIVE
Protein, ur: NEGATIVE mg/dL
Specific Gravity, Urine: 1.003 — ABNORMAL LOW (ref 1.005–1.030)
pH: 7 (ref 5.0–8.0)

## 2024-10-13 LAB — COMPREHENSIVE METABOLIC PANEL WITH GFR
ALT: 28 U/L (ref 0–44)
AST: 30 U/L (ref 15–41)
Albumin: 4.6 g/dL (ref 3.5–5.0)
Alkaline Phosphatase: 122 U/L (ref 38–126)
Anion gap: 11 (ref 5–15)
BUN: 16 mg/dL (ref 6–20)
CO2: 32 mmol/L (ref 22–32)
Calcium: 9.5 mg/dL (ref 8.9–10.3)
Chloride: 99 mmol/L (ref 98–111)
Creatinine, Ser: 1.03 mg/dL — ABNORMAL HIGH (ref 0.44–1.00)
GFR, Estimated: >60 mL/min (ref >60)
Glucose, Bld: 77 mg/dL (ref 70–99)
Potassium: 2.8 mmol/L — ABNORMAL LOW (ref 3.5–5.1)
Sodium: 142 mmol/L (ref 135–145)
Total Bilirubin: 0.3 mg/dL (ref 0.0–1.2)
Total Protein: 8.2 g/dL — ABNORMAL HIGH (ref 6.5–8.1)

## 2024-10-13 LAB — LACTIC ACID, PLASMA: Lactic Acid, Venous: 1.3 mmol/L (ref 0.5–1.9)

## 2024-10-13 LAB — TROPONIN T, HIGH SENSITIVITY: Troponin T High Sensitivity: <15 ng/L (ref 0–19)

## 2024-10-13 MED ORDER — ONDANSETRON HCL 4 MG/2ML IJ SOLN
4.0000 mg | Freq: Once | INTRAMUSCULAR | Status: AC
  Administered 2024-10-13: 4 mg via INTRAVENOUS
  Filled 2024-10-13: qty 2

## 2024-10-13 MED ORDER — POTASSIUM CHLORIDE 20 MEQ PO PACK
40.0000 meq | PACK | Freq: Once | ORAL | Status: AC
  Administered 2024-10-13: 40 meq via ORAL
  Filled 2024-10-13: qty 2

## 2024-10-13 MED ORDER — DOXYCYCLINE HYCLATE 100 MG PO CAPS: 100.0000 mg | ORAL_CAPSULE | Freq: Two times a day (BID) | ORAL | 0 refills | Status: AC

## 2024-10-13 MED ORDER — DOXYCYCLINE HYCLATE 100 MG PO TABS
100.0000 mg | ORAL_TABLET | Freq: Once | ORAL | Status: AC
  Administered 2024-10-13: 100 mg via ORAL
  Filled 2024-10-13: qty 1

## 2024-10-13 MED ORDER — POTASSIUM CHLORIDE 10 MEQ/100ML IV SOLN: 10.0000 meq | INTRAVENOUS | Status: DC

## 2024-10-13 MED ORDER — MORPHINE SULFATE (PF) 4 MG/ML IV SOLN
4.0000 mg | Freq: Once | INTRAVENOUS | Status: AC
  Administered 2024-10-13: 4 mg via INTRAVENOUS
  Filled 2024-10-13: qty 1

## 2024-10-13 NOTE — Discharge Instructions (Addendum)
 Continue bandaging and cleaning the wound as you have been doing I would cover it at night as well as during the day as it is healing by secondary intention from the inside out and is going to take some time, keeping it open at night may increase the risk of exposing it to bacterial pathogens in your house, bed.  You do not have any improvement of the redness, swelling, or drainage from the wound after 4 days on the antibiotics I would return for further evaluation, possible need for IV antibiotics.  I would increase your potassium for the next week from 1 tablet at night and in the morning to 2 tablets.  And I would have your primary care doctor recheck in 1 to 2 weeks.

## 2024-10-13 NOTE — ED Triage Notes (Signed)
 Pts wound measures 5cm long X 2.5cm wide X 0.75cm deep.

## 2024-10-13 NOTE — ED Triage Notes (Signed)
 Pt arrived via POV c/o RLE penetrating injury. Pt reports seeing her PCP Dr Niel last week and being told to leave it alone and not worry about it. Pt now presents with redness, swelling and black tissue around the wound.

## 2024-10-13 NOTE — ED Provider Notes (Signed)
 [place] EMERGENCY DEPARTMENT AT [facility] Provider Note   CSN: [id number] Arrival date & time: 10/13/24  8363     Patient presents with: Leg Injury   Kalianna Verbeke is a 48 y.o. female past medical history significant for obesity, history of substance abuse, COPD, depression, diabetes who presents to concern for right lower extremity penetrating injury.  She reports that she was in her rocking chair which ended up stabbing her in the anterior right lower leg.  She saw her doctor earlier this last week, and was told to use Neosporin, clean the wound daily, and dressed with sterile dressings, did not get placed on antibiotics.  She reports green drainage, increasing redness, pain, swelling.   HPI     Prior to Admission medications   Medication Sig Start Date End Date Taking? Authorizing Provider  doxycycline  (VIBRAMYCIN ) 100 MG capsule Take 1 capsule (100 mg total) by mouth 2 (two) times daily. 10/13/24  Yes Gleason Ardoin H, PA-C  acetaminophen  (TYLENOL ) 500 MG tablet Take 2 tablets (1,000 mg total) by mouth every 6 (six) hours as needed for mild pain or moderate pain. 11/14/22   Gawne, Meghan M, PA-C  albuterol  (VENTOLIN  HFA) 108 (90 Base) MCG/ACT inhaler Inhale 2 puffs into the lungs every 6 (six) hours as needed for wheezing or shortness of breath.    [provider]  allopurinol  (ZYLOPRIM ) 100 MG tablet Take 100 mg by mouth in the morning. 04/24/24   [provider]  amphetamine -dextroamphetamine  (ADDERALL) 30 MG tablet Take 30 mg by mouth 2 (two) times daily. 10/25/21   [provider]  ARIPiprazole  (ABILIFY ) 5 MG tablet Take 5 mg by mouth in the morning.    [provider]  aspirin  EC 81 MG tablet Take 81 mg by mouth in the morning. Swallow whole.    [provider]  atenolol  (TENORMIN ) 50 MG tablet Take 100 mg by mouth in the morning. 03/20/22   [provider]  buPROPion  (WELLBUTRIN  SR) 150 MG 12 hr tablet Take 150  mg by mouth in the morning.    [provider]  busPIRone  (BUSPAR ) 30 MG tablet Take 15-30 mg by mouth 2 (two) times daily as needed (anxiety).    [provider]  cephALEXin  (KEFLEX ) 500 MG capsule Take 1 capsule (500 mg total) by mouth 4 (four) times daily. 01/06/24   Marlo Asberry SAILOR, MD  fenofibrate  (TRICOR ) 145 MG tablet Take 145 mg by mouth every evening.    [provider]  fluticasone -salmeterol (ADVAIR) 100-50 MCG/ACT AEPB Inhale 1 puff into the lungs daily.    [provider]  furosemide  (LASIX ) 20 MG tablet Take 40 mg by mouth 2 (two) times daily.    [provider]  gabapentin  (NEURONTIN ) 300 MG capsule Take 300 mg by mouth 3 (three) times daily. 11/30/20   [provider]  hydrOXYzine  (VISTARIL ) 50 MG capsule Take 50-100 mg by mouth at bedtime. 10/25/21   [provider]  insulin  lispro (HUMALOG ) 100 UNIT/ML KwikPen Inject 5 Units into the skin daily as needed (elevated blood sugar).    [provider]  levothyroxine  (SYNTHROID ) 125 MCG tablet Take 125 mcg by mouth daily before breakfast. 10/21/20   [provider]  lidocaine  (LIDODERM ) 5 % Place 1 patch onto the skin daily. Remove & Discard patch within 12 hours or as directed by MD Patient taking differently: Place 1 patch onto the skin daily as needed (pain.). Remove & Discard patch within 12 hours or  as directed by MD 07/08/22   Theotis Cameron HERO, PA-C  METHADONE  HCL PO Take 175 mg by mouth daily.    [provider]  Multiple Vitamins-Minerals (MULTIVITAMIN WITH MINERALS) tablet Take 1 tablet by mouth daily.    [provider]  Omega-3 Fatty Acids (FISH OIL) 1000 MG CAPS Take 3 capsules by mouth at bedtime.    [provider]  omeprazole (PRILOSEC) 40 MG capsule Take 80 mg by mouth every morning.    [provider]  oxyCODONE -acetaminophen  (PERCOCET) 7.5-325 MG tablet Take 1 tablet by mouth in the morning, at noon, in the  evening, and at bedtime.    [provider]  potassium chloride  SA (KLOR-CON  M) 20 MEQ tablet Take 1 tablet (20 mEq total) by mouth 2 (two) times daily. Patient taking differently: Take 20 mEq by mouth daily. 01/06/24   Marlo Asberry SAILOR, MD  rOPINIRole  (REQUIP ) 1 MG tablet Take 1 mg by mouth at bedtime.    [provider]  sertraline  (ZOLOFT ) 100 MG tablet Take 200 mg by mouth in the morning. 05/31/16   [provider]  spironolactone  (ALDACTONE ) 25 MG tablet Take 25 mg by mouth in the morning. 04/18/24   [provider]  tiZANidine  (ZANAFLEX ) 4 MG tablet Take 4 mg by mouth every 8 (eight) hours as needed for muscle spasms.    [provider]  tiZANidine  (ZANAFLEX ) 4 MG tablet Take 1 tablet (4 mg total) by mouth every 6 (six) hours as needed for muscle spasms. 08/14/24   Beuford Anes, MD  topiramate  (TOPAMAX ) 100 MG tablet Take 100 mg by mouth at bedtime. 10/29/20   [provider]  zinc  gluconate 50 MG tablet Take 50 mg by mouth in the morning.    [provider]    Allergies: Cymbalta [duloxetine hcl], Latex, Zorvolex [diclofenac], Ambien  [zolpidem  tartrate], Benadryl [diphenhydramine], Codeine, Darvon [propoxyphene], Mobic  [meloxicam ], and Sulfa antibiotics    Review of Systems  All other systems reviewed and are negative.   Updated Vital Signs BP (!) 121/99 (BP Location: Right Arm)   Pulse (!) 54   Temp 98.7 F (37.1 C) (Oral)   Resp 15   Ht 5' 6 (1.676 m)   Wt 98.9 kg   LMP  (LMP Unknown)   SpO2 95%   BMI 35.19 kg/m   Physical Exam Vitals and nursing note reviewed.  Constitutional:      General: She is not in acute distress.    Appearance: Normal appearance.  HENT:     Head: Normocephalic and atraumatic.  Eyes:     General:        Right eye: No discharge.        Left eye: No discharge.  Cardiovascular:     Rate and Rhythm: Normal rate and regular rhythm.     Pulses: Normal pulses.     Heart sounds: No  murmur heard.    No friction rub. No gallop.     Comments: DP, PT pulses 2+ distal to the affected injury Pulmonary:     Effort: Pulmonary effort is normal.     Breath sounds: Normal breath sounds.  Abdominal:     General: Bowel sounds are normal.     Palpations: Abdomen is soft.  Musculoskeletal:     Comments: Circular at least 1 cm deep wound on anterior right shin, no obvious purulence noted, there is some necrotic tissue surrounding, and there is a fair amount of surrounding redness with some warmth, no area  of focal abscess, fluctuance.  Skin:    General: Skin is warm and dry.     Capillary Refill: Capillary refill takes less than 2 seconds.  Neurological:     Mental Status: She is alert and oriented to person, place, and time.  Psychiatric:        Mood and Affect: Mood normal.        Behavior: Behavior normal.     (all labs ordered are listed, but only abnormal results are displayed) Labs Reviewed  COMPREHENSIVE METABOLIC PANEL WITH GFR - Abnormal; Notable for the following components:      Result Value   Potassium 2.8 (*)    Creatinine, Ser 1.03 (*)    Total Protein 8.2 (*)    All other components within normal limits  URINALYSIS, W/ REFLEX TO CULTURE (INFECTION SUSPECTED) - Abnormal; Notable for the following components:   Color, Urine COLORLESS (*)    Specific Gravity, Urine 1.003 (*)    Bacteria, UA RARE (*)    All other components within normal limits  CULTURE, BLOOD (ROUTINE X 2)  CULTURE, BLOOD (ROUTINE X 2)  LACTIC ACID, PLASMA  CBC WITH DIFFERENTIAL/PLATELET  LACTIC ACID, PLASMA  TROPONIN T, HIGH SENSITIVITY    EKG: None  Radiology: DG Chest 2 View Result Date: 10/13/2024 CLINICAL DATA:  [id number] SOB (shortness of breath) [id number] EXAM: CHEST - 2 VIEW COMPARISON:  February 08, 2014 FINDINGS: Patchy nodular opacity in the right upper lung on the frontal radiograph, measuring 2.5 cm. No pleural effusion or pneumothorax. No cardiomegaly.No acute fracture or  destructive lesion. Multilevel thoracic osteophytosis. IMPRESSION: Patchy, nodular opacity in the right upper lung zone on the frontal radiograph, measuring 2.5 cm. In the correct clinical context, this may represent a developing bronchopneumonia. A 2 view chest radiograph in 6-12 weeks is recommended to document resolution once treatment is complete. Electronically Signed   By: Rogelia Myers M.D.   On: 10/13/2024 18:52   DG Tibia/Fibula Right Result Date: 10/13/2024 CLINICAL DATA:  injury, erythema, swelling, and black tissue around the wound. EXAM: DG TIBIA/FIBULA 2V*R* COMPARISON:  None Available. FINDINGS: No acute fracture or dislocation. Osteophyte formation of the lateral compartment of the knee. Cortical lag screw within the fifth metatarsal. Diffuse subcutaneous edema and soft tissue swelling about the calf. No radiopaque foreign body or subcutaneous gas. IMPRESSION: Diffuse subcutaneous edema and soft tissue swelling about the calf. No radiopaque foreign body or subcutaneous gas. No radiographic findings of osteomyelitis, at this time. Electronically Signed   By: Rogelia Myers M.D.   On: 10/13/2024 18:42     Procedures   Medications Ordered in the ED  morphine  (PF) 4 MG/ML injection 4 mg (4 mg Intravenous Given 10/13/24 1821)  ondansetron  (ZOFRAN ) injection 4 mg (4 mg Intravenous Given 10/13/24 1821)  potassium chloride  (KLOR-CON ) packet 40 mEq (40 mEq Oral Given 10/13/24 1920)  doxycycline  (VIBRA -TABS) tablet 100 mg (100 mg Oral Given 10/13/24 1920)    Clinical Course as of 10/13/24 1923  Mon Oct 13, 2024  1714 Comprehensive metabolic panel [CO]    Clinical Course User Index [CO] Chandra Cyndee JONETTA Jann                                 Medical Decision Making Amount and/or Complexity of Data Reviewed Labs: ordered. Decision-making details documented in ED Course. Radiology: ordered.   This patient is a 48 y.o. female  who presents to the  ED for concern of leg wound.    Differential diagnoses prior to evaluation: The emergent differential diagnosis includes, but is not limited to,  cellulitis, abscess . This is not an exhaustive differential.   Past Medical History / Co-morbidities / Social History: obesity, history of substance abuse, COPD, depression, diabetes  Physical Exam: Physical exam performed. The pertinent findings include: Circular at least 1 cm deep wound on anterior right shin, no obvious purulence noted, there is some necrotic tissue surrounding, and there is a fair amount of surrounding redness with some warmth, no area of focal abscess, fluctuance.   DP, PT pulses 2+ distal to the affected injury  Mild bradycardic heart rate, pulse 54, mild diastolic hypertension, blood pressure 121/99.  Patient is afebrile, stable oxygen saturation on room air.  Lab Tests/Imaging studies: I personally interpreted labs/imaging and the pertinent results include: CBC overall unremarkable, notably with no leukocytosis.  UA overall unremarkable.  Rare bacteria noted but with no leukocytes, white blood cells, low clinical suspicion for acute urinary tract infection.  Normal lactic acid, negative troponin x 1 in context of atypical chest pain likely secondary to anxiety per patient story.  Her CMP is notable for hypokalemia, testing 2.8.  She reports that she has chronic hypokalemia, I addressed this with patient and recommended oral as well as IV potassium, but she declined IV potassium stating that she would rather increase her home dose and recheck with her PCP.  Given that she has no acute EKG changes and no known history of chronic hypokalemia I think this is reasonable.. I agree with the radiologist interpretation.  Cardiac monitoring: EKG obtained and interpreted by myself and attending physician which shows: NSR, short PR, abnormal Q, no acute ST-T changes, nonischemic appearance of ekg   Medications: I ordered medication including doxycycline , potassium  for cellulitis, hypokalemia, zofran , morphine  for pain, nausea.  I have reviewed the patients home medicines and have made adjustments as needed.   Disposition: After consideration of the diagnostic results and the patients response to treatment, I feel that patient is stable for discharge with cellulitis, wound infection with encouragement for close PCP follow up and extensive return precautions .   emergency department workup does not suggest an emergent condition requiring admission or immediate intervention beyond what has been performed at this time. The plan is: as above. The patient is safe for discharge and has been instructed to return immediately for worsening symptoms, change in symptoms or any other concerns.   Final diagnoses:  Leg wound, left, initial encounter  Cellulitis of left lower extremity  Hypokalemia    ED Discharge Orders          Ordered    doxycycline  (VIBRAMYCIN ) 100 MG capsule  2 times daily        10/13/24 1914               Rosan Sherlean VEAR DEVONNA 10/13/24 1924    Melvenia Motto, MD 10/13/24 [phone]

## 2024-10-18 LAB — CULTURE, BLOOD (ROUTINE X 2)
Culture: NO GROWTH
Culture: NO GROWTH
Special Requests: ADEQUATE
Special Requests: ADEQUATE

## 2025-01-29 ENCOUNTER — Ambulatory Visit (HOSPITAL_COMMUNITY)

## 2025-01-29 ENCOUNTER — Encounter
